# Patient Record
Sex: Female | Born: 1965 | Race: White | Hispanic: No | Marital: Married | Smoking: Former smoker
Health system: Southern US, Community
[De-identification: ages and names within clinical notes are randomized; demographics above are authoritative.]

## PROBLEM LIST (undated history)

## (undated) DIAGNOSIS — E785 Hyperlipidemia, unspecified: Secondary | ICD-10-CM

## (undated) DIAGNOSIS — G4700 Insomnia, unspecified: Secondary | ICD-10-CM

## (undated) DIAGNOSIS — T7840XA Allergy, unspecified, initial encounter: Secondary | ICD-10-CM

## (undated) DIAGNOSIS — E669 Obesity, unspecified: Secondary | ICD-10-CM

## (undated) DIAGNOSIS — E119 Type 2 diabetes mellitus without complications: Secondary | ICD-10-CM

## (undated) HISTORY — PX: TUBAL LIGATION: SHX77

## (undated) HISTORY — PX: BREAST BIOPSY: SHX20

## (undated) HISTORY — DX: Type 2 diabetes mellitus without complications: E11.9

## (undated) HISTORY — DX: Hyperlipidemia, unspecified: E78.5

## (undated) HISTORY — PX: OTHER SURGICAL HISTORY: SHX169

## (undated) HISTORY — DX: Obesity, unspecified: E66.9

## (undated) HISTORY — DX: Insomnia, unspecified: G47.00

## (undated) HISTORY — DX: Allergy, unspecified, initial encounter: T78.40XA

---

## 1997-10-01 ENCOUNTER — Other Ambulatory Visit: Admission: RE | Admit: 1997-10-01 | Discharge: 1997-10-01 | Payer: Self-pay | Admitting: Obstetrics and Gynecology

## 1998-10-06 ENCOUNTER — Other Ambulatory Visit: Admission: RE | Admit: 1998-10-06 | Discharge: 1998-10-06 | Payer: Self-pay | Admitting: Obstetrics and Gynecology

## 1999-11-02 ENCOUNTER — Other Ambulatory Visit: Admission: RE | Admit: 1999-11-02 | Discharge: 1999-11-02 | Payer: Self-pay | Admitting: Obstetrics and Gynecology

## 2000-04-01 ENCOUNTER — Ambulatory Visit (HOSPITAL_COMMUNITY): Admission: RE | Admit: 2000-04-01 | Discharge: 2000-04-01 | Payer: Self-pay | Admitting: Obstetrics and Gynecology

## 2000-11-14 ENCOUNTER — Other Ambulatory Visit: Admission: RE | Admit: 2000-11-14 | Discharge: 2000-11-14 | Payer: Self-pay | Admitting: Obstetrics and Gynecology

## 2002-05-15 ENCOUNTER — Other Ambulatory Visit: Admission: RE | Admit: 2002-05-15 | Discharge: 2002-05-15 | Payer: Self-pay | Admitting: Obstetrics & Gynecology

## 2004-04-01 ENCOUNTER — Other Ambulatory Visit: Admission: RE | Admit: 2004-04-01 | Discharge: 2004-04-01 | Payer: Self-pay | Admitting: Obstetrics and Gynecology

## 2004-07-10 ENCOUNTER — Ambulatory Visit (HOSPITAL_COMMUNITY): Admission: RE | Admit: 2004-07-10 | Discharge: 2004-07-10 | Payer: Self-pay | Admitting: Internal Medicine

## 2004-08-12 ENCOUNTER — Encounter: Admission: RE | Admit: 2004-08-12 | Discharge: 2004-08-12 | Payer: Self-pay | Admitting: Occupational Medicine

## 2007-01-09 IMAGING — CR DG SINUSES COMPLETE 3+V
6 series · 6 of 6 positions shown · non-contrast
Comparison: none

HISTORY: Congestion, hoarseness, cough

CHEST 2 VIEWS:
No prior study for comparison
Normal heart size, mediastinal contours, and vascularity.
Lungs clear.
No effusion or pneumothorax.
Bones unremarkable.

[w waters *]
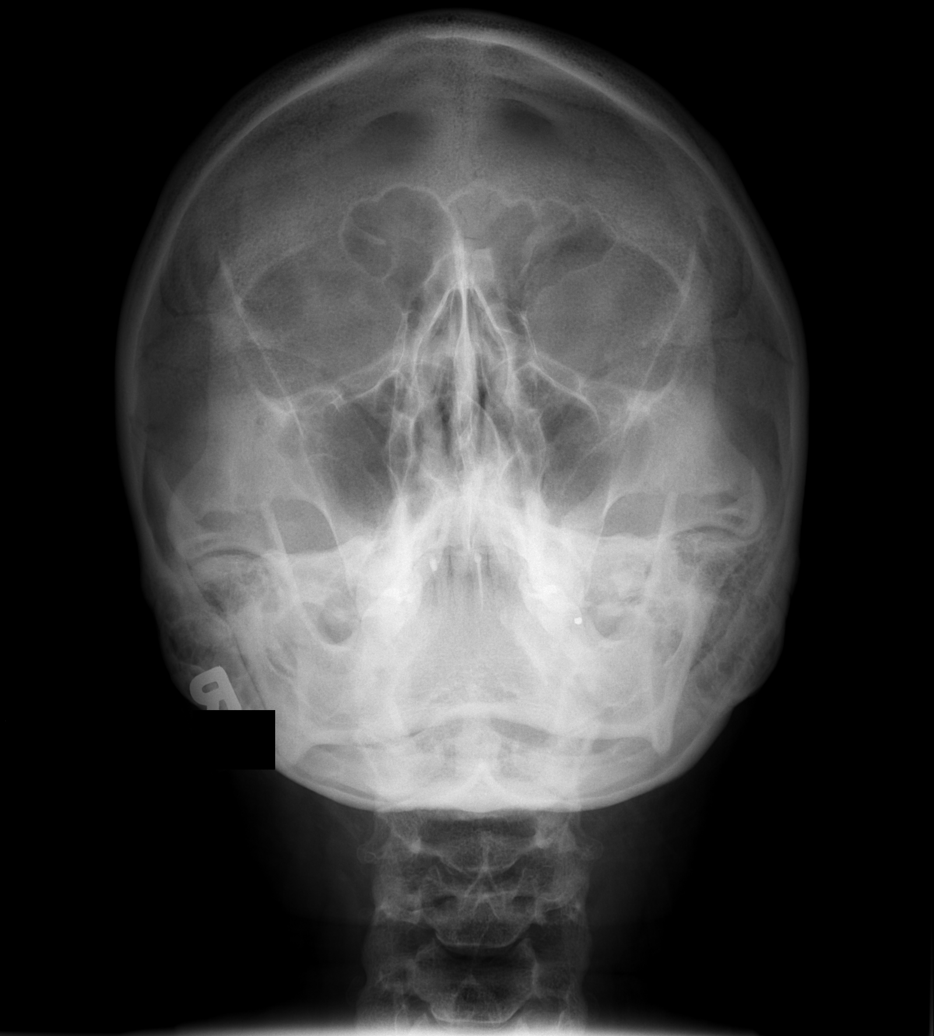

[w smv]
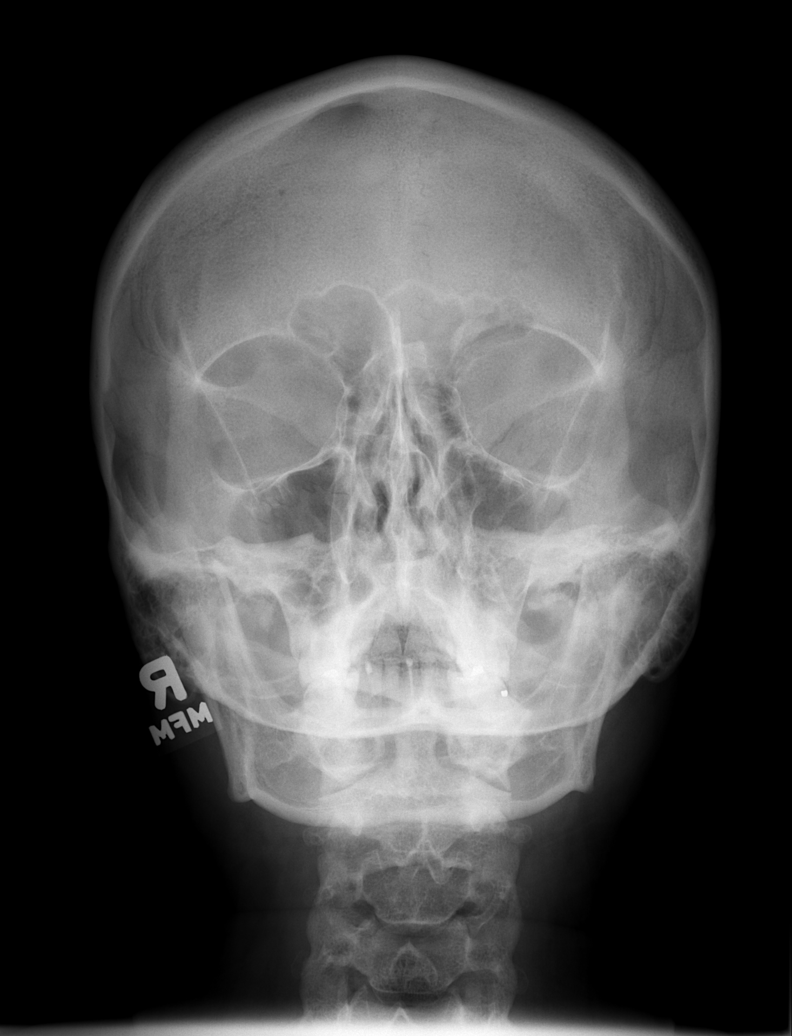

[[person_name] (1 of 2)]
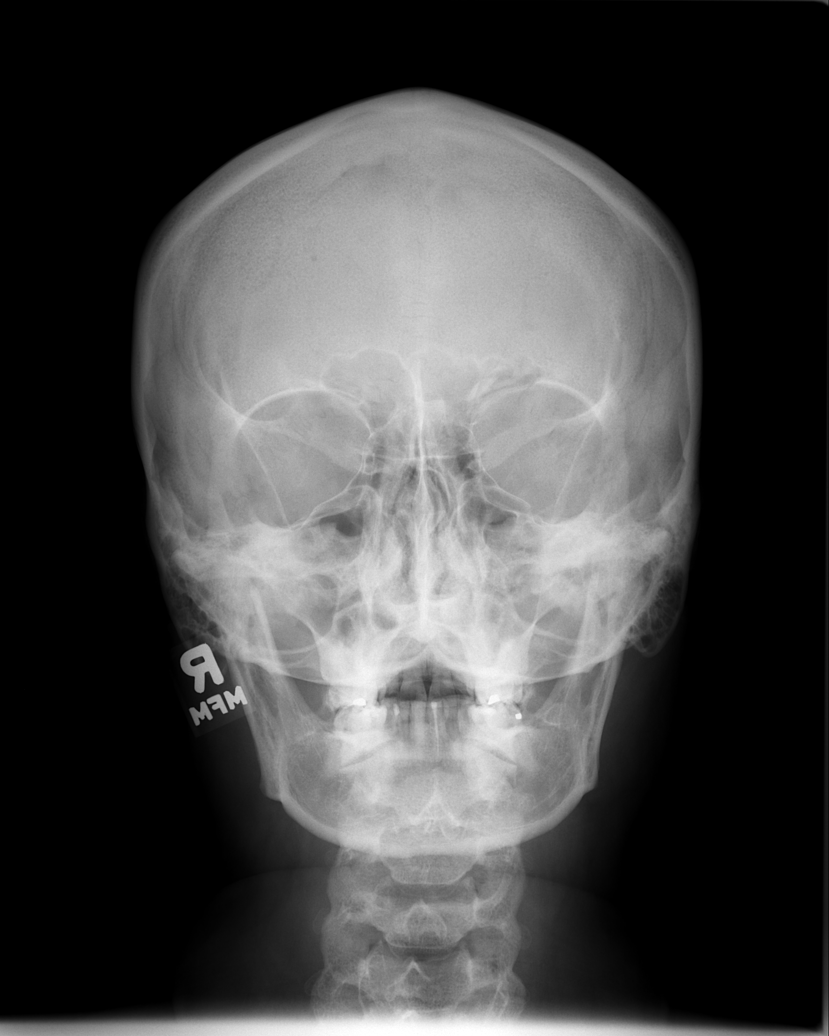

[w skull lat]
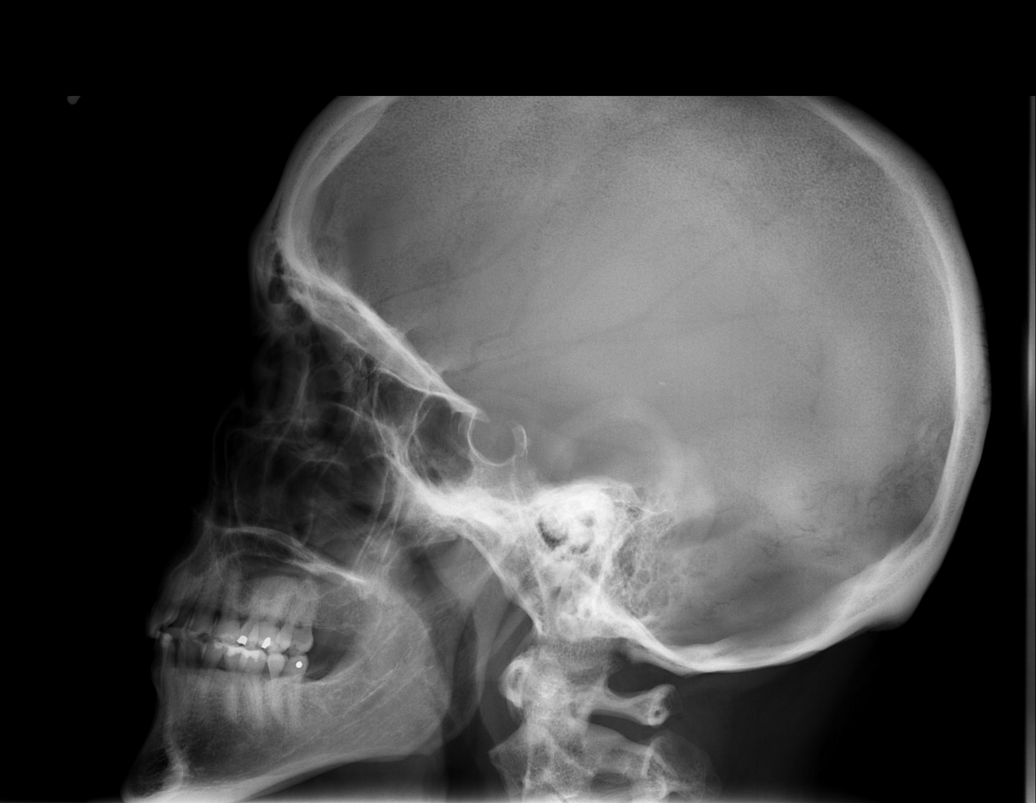

[[person_name] (2 of 2)]
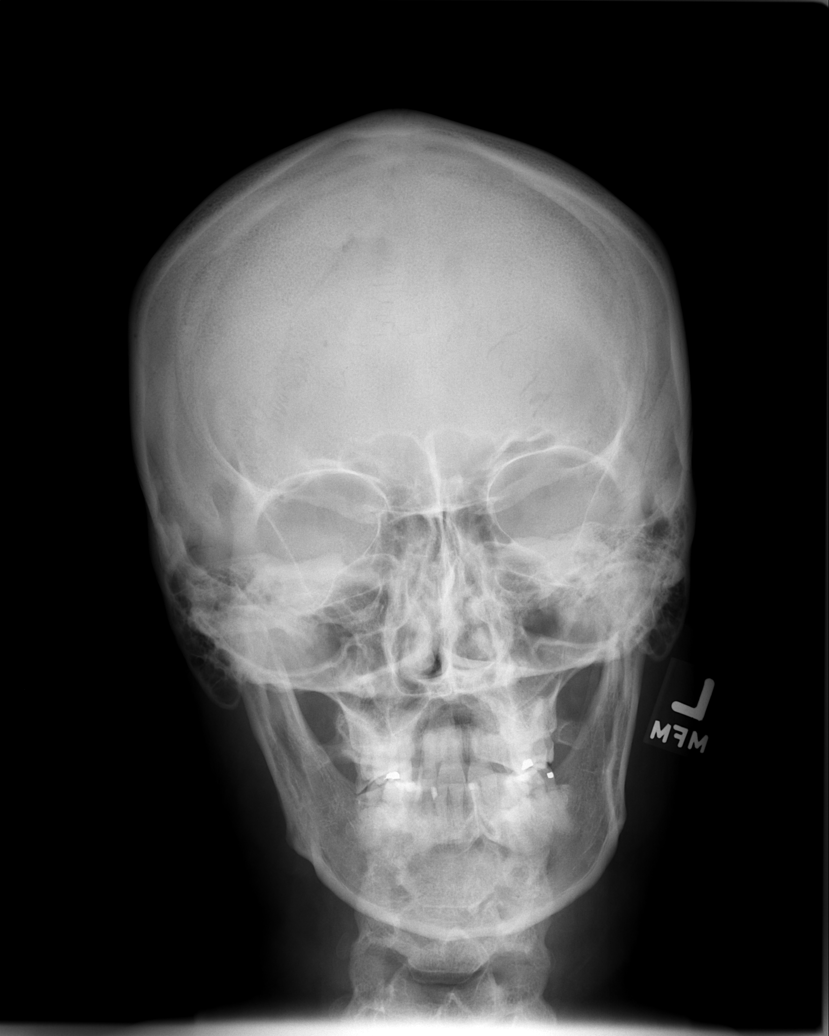

[w smv *]
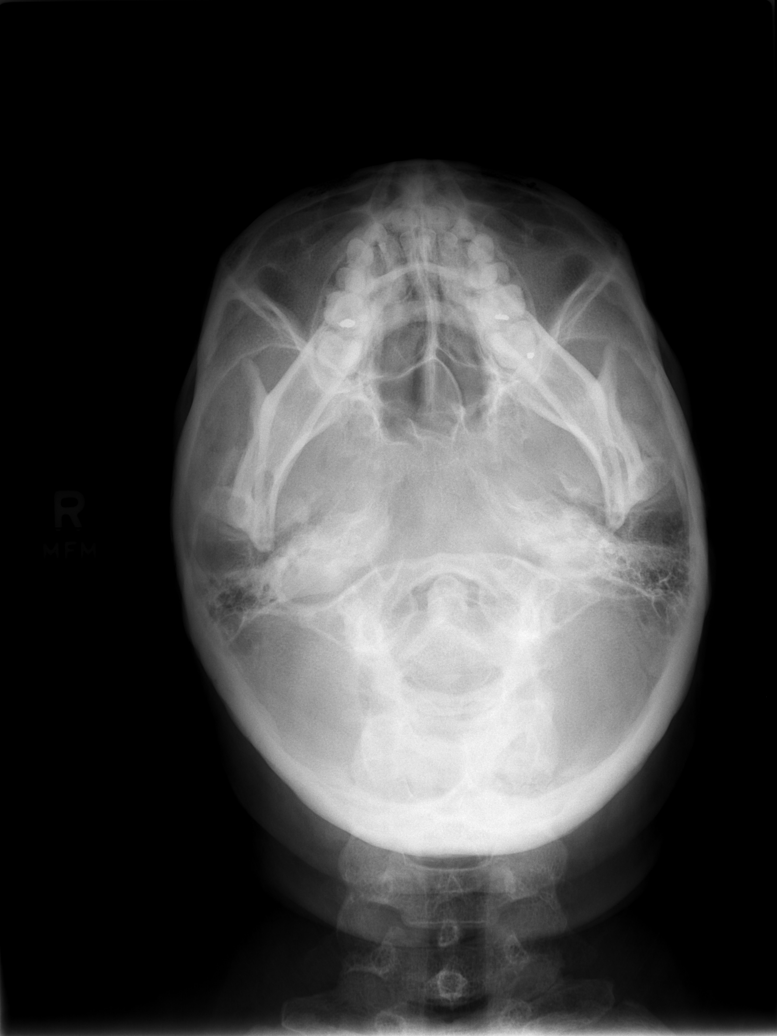

[6 of 6 positions shown; findings below may reference images not displayed]

IMPRESSION: No acute abnormalities.

SINUSES 5 VIEWS:

Nasal septum midline.
Paranasal sinuses clear.
Orbits and facial bones intact and normal appearance.
No sinus opacification or air-fluid levels.
Sella turcica normal size.
No acute abnormality seen.
IMPRESSION: Negative sinus series.

## 2007-01-09 IMAGING — CR DG CHEST 2V
2 series · 2 of 2 positions shown · non-contrast
Comparison: none

HISTORY: Congestion, hoarseness, cough

CHEST 2 VIEWS:
No prior study for comparison
Normal heart size, mediastinal contours, and vascularity.
Lungs clear.
No effusion or pneumothorax.
Bones unremarkable.

[w chest pa]
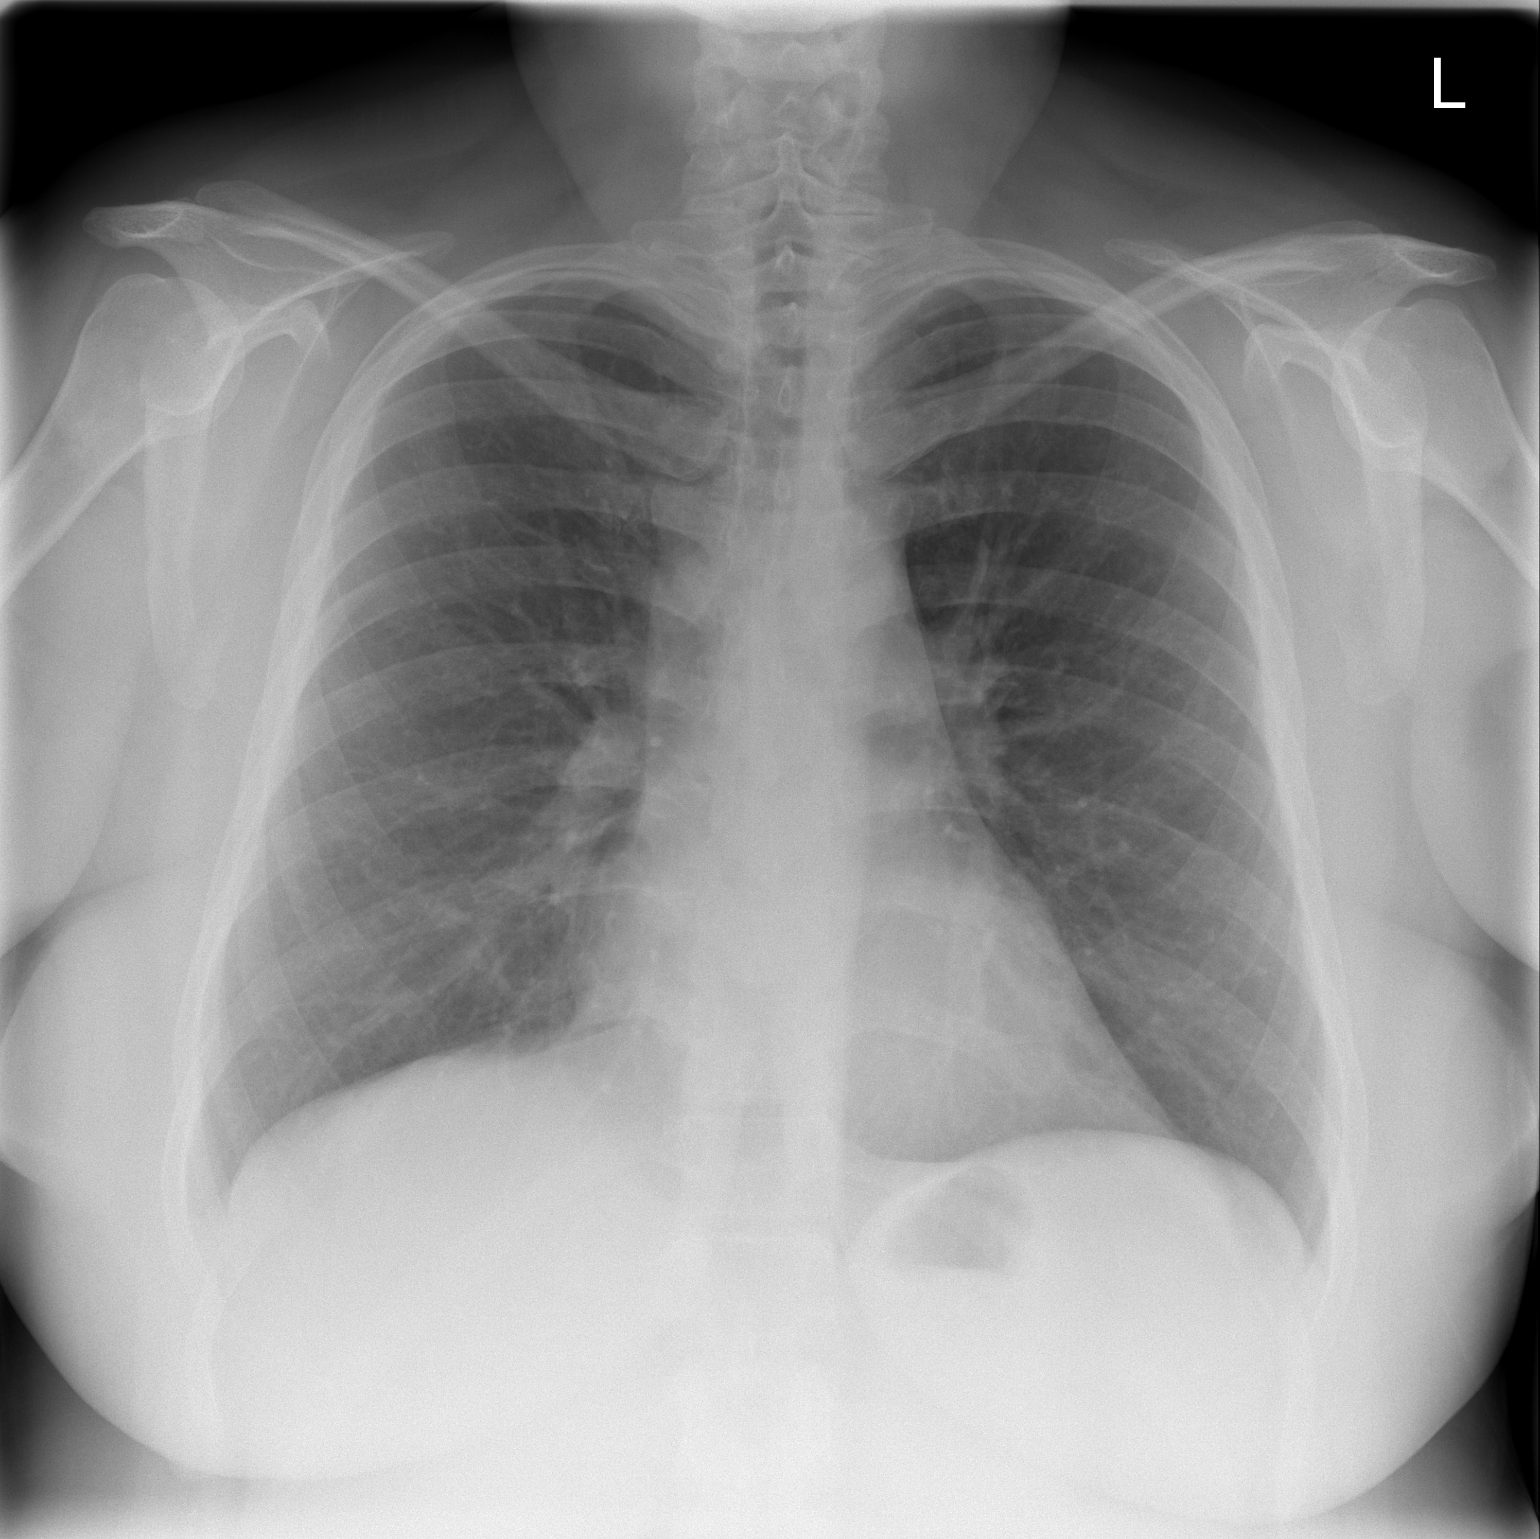

[w chest lat]
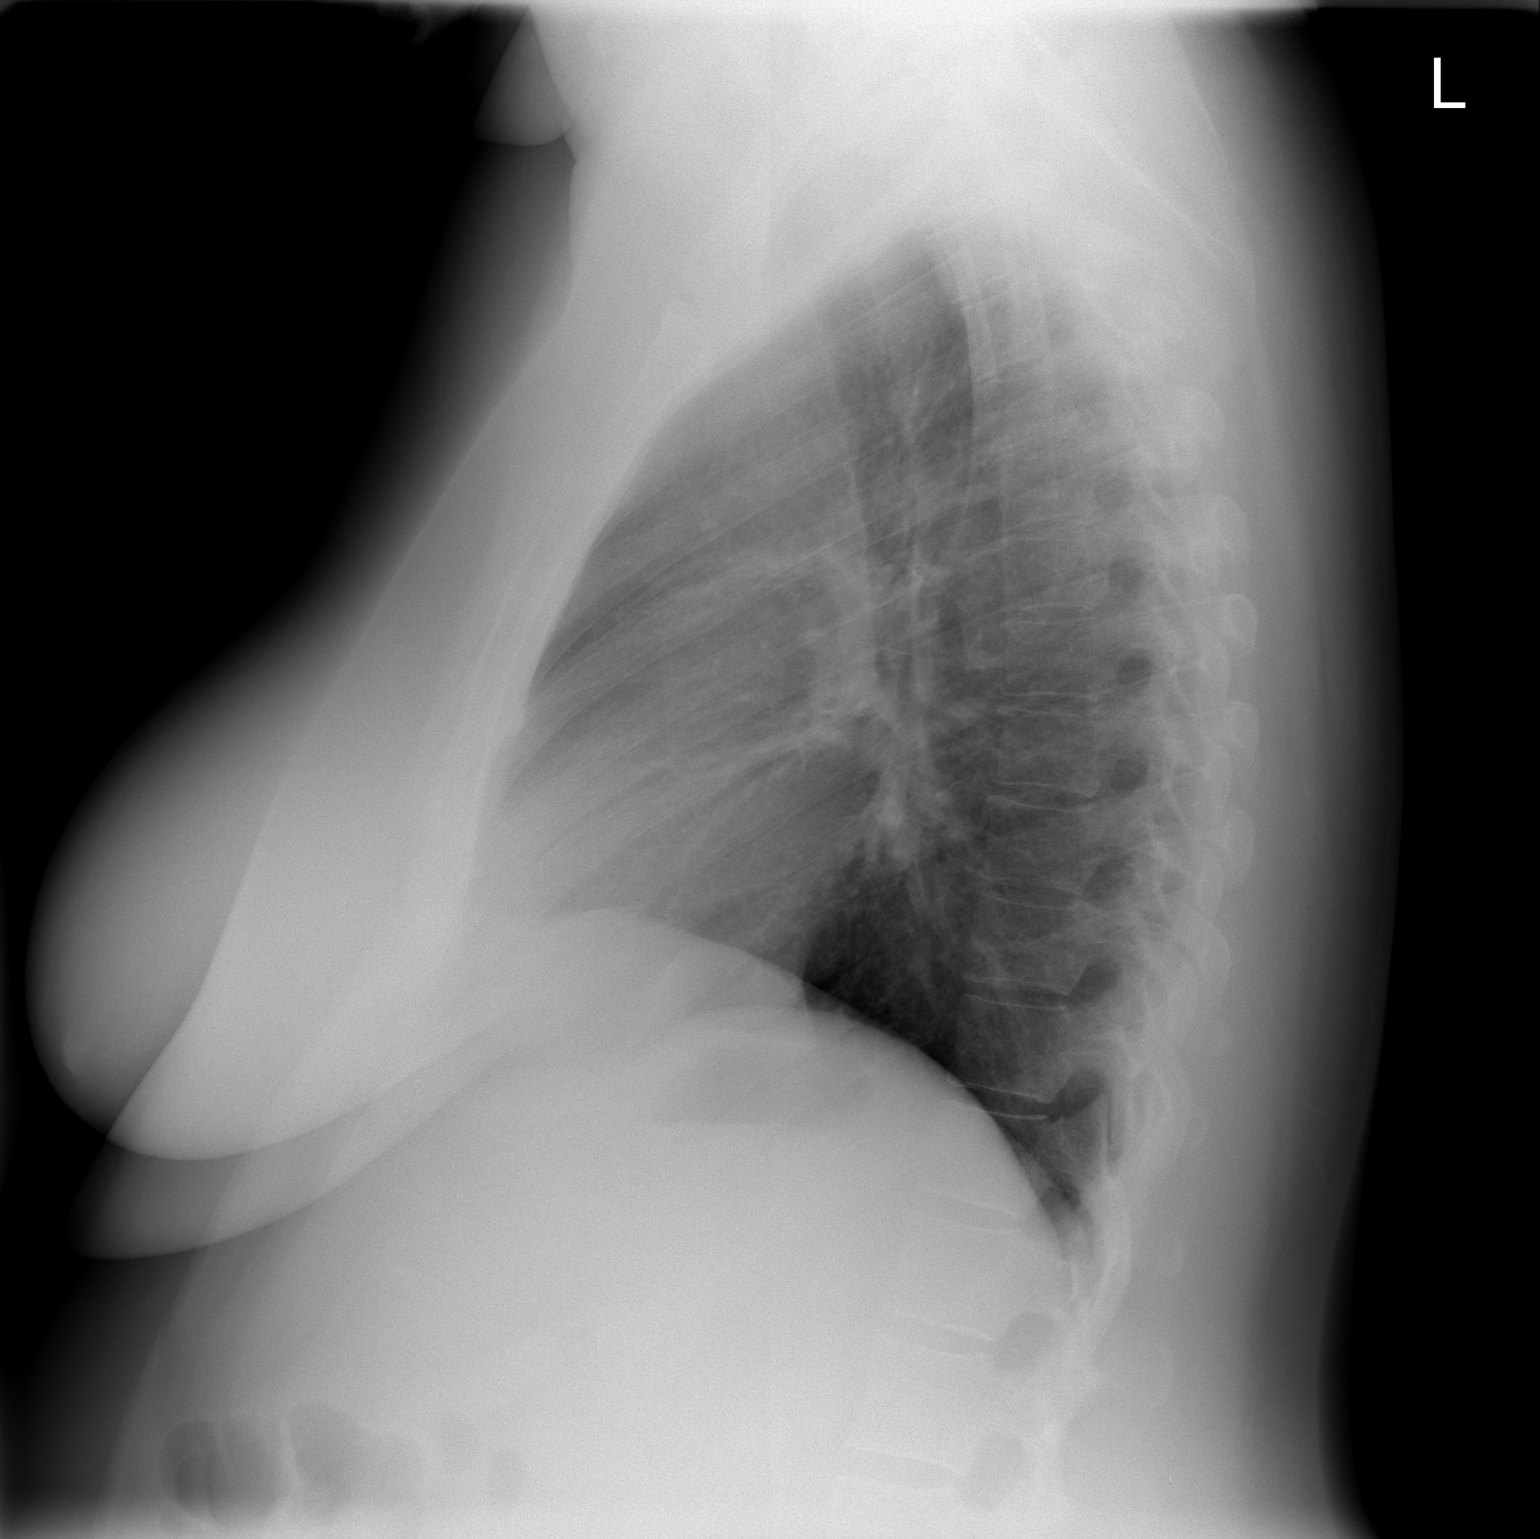

[2 of 2 positions shown; findings below may reference images not displayed]

IMPRESSION: No acute abnormalities.

SINUSES 5 VIEWS:

Nasal septum midline.
Paranasal sinuses clear.
Orbits and facial bones intact and normal appearance.
No sinus opacification or air-fluid levels.
Sella turcica normal size.
No acute abnormality seen.
IMPRESSION: Negative sinus series.

## 2007-10-23 ENCOUNTER — Ambulatory Visit: Payer: Self-pay | Admitting: Internal Medicine

## 2007-11-02 ENCOUNTER — Ambulatory Visit: Payer: Self-pay | Admitting: Internal Medicine

## 2007-12-21 ENCOUNTER — Ambulatory Visit: Payer: Self-pay | Admitting: Internal Medicine

## 2008-01-23 ENCOUNTER — Ambulatory Visit: Payer: Self-pay | Admitting: Internal Medicine

## 2008-03-25 ENCOUNTER — Ambulatory Visit: Payer: Self-pay | Admitting: Internal Medicine

## 2008-07-30 ENCOUNTER — Ambulatory Visit: Payer: Self-pay | Admitting: Internal Medicine

## 2008-11-12 ENCOUNTER — Ambulatory Visit: Payer: Self-pay | Admitting: Internal Medicine

## 2009-05-29 ENCOUNTER — Ambulatory Visit: Payer: Self-pay | Admitting: Internal Medicine

## 2010-02-19 ENCOUNTER — Ambulatory Visit: Payer: Commercial Managed Care - PPO | Attending: Internal Medicine | Admitting: *Deleted

## 2010-02-19 DIAGNOSIS — E119 Type 2 diabetes mellitus without complications: Secondary | ICD-10-CM | POA: Insufficient documentation

## 2010-02-19 DIAGNOSIS — Z713 Dietary counseling and surveillance: Secondary | ICD-10-CM | POA: Insufficient documentation

## 2010-03-17 ENCOUNTER — Encounter (INDEPENDENT_AMBULATORY_CARE_PROVIDER_SITE_OTHER): Payer: Commercial Managed Care - PPO | Admitting: Internal Medicine

## 2010-03-17 DIAGNOSIS — E119 Type 2 diabetes mellitus without complications: Secondary | ICD-10-CM

## 2010-03-17 DIAGNOSIS — E785 Hyperlipidemia, unspecified: Secondary | ICD-10-CM

## 2010-03-17 DIAGNOSIS — Z23 Encounter for immunization: Secondary | ICD-10-CM

## 2010-03-26 ENCOUNTER — Ambulatory Visit (INDEPENDENT_AMBULATORY_CARE_PROVIDER_SITE_OTHER): Payer: Commercial Managed Care - PPO | Admitting: Internal Medicine

## 2010-03-26 DIAGNOSIS — J9801 Acute bronchospasm: Secondary | ICD-10-CM

## 2010-04-28 ENCOUNTER — Ambulatory Visit (INDEPENDENT_AMBULATORY_CARE_PROVIDER_SITE_OTHER): Payer: Commercial Managed Care - PPO | Admitting: Internal Medicine

## 2010-04-28 DIAGNOSIS — E119 Type 2 diabetes mellitus without complications: Secondary | ICD-10-CM

## 2010-05-04 ENCOUNTER — Encounter: Payer: Self-pay | Admitting: Internal Medicine

## 2010-05-06 ENCOUNTER — Encounter: Payer: Self-pay | Admitting: Internal Medicine

## 2010-05-06 DIAGNOSIS — Z8669 Personal history of other diseases of the nervous system and sense organs: Secondary | ICD-10-CM | POA: Insufficient documentation

## 2010-05-06 DIAGNOSIS — G4700 Insomnia, unspecified: Secondary | ICD-10-CM | POA: Insufficient documentation

## 2010-06-05 NOTE — Op Note (Signed)
[facility]  Patient:    Ashley Gilmore, Ashley Gilmore                           MRN: [id number] Proc. Date: 04/01/00 Adm. Date:  [id number] Attending:  Osborn Coho                           Operative Report  PREOPERATIVE DIAGNOSES:       1. Twin intrauterine pregnancy, 8-week                                  gestational age.                               2. Patient desires termination of pregnancy.                               3. Patient desires permanent sterilization.  POSTOPERATIVE DIAGNOSES:  PROCEDURES:                   1. Dilation and evacuation.                               2. Bilateral tubal ligation, application of                                  Hulka clips.  SURGEON:                      Mark E. Dareen Piano, M.D.  ANESTHESIA:                   General endotracheal.  ESTIMATED BLOOD LOSS:         100 cc.  SPECIMENS:                    Products of conception sent to pathology.  DRAINS:                       Red rubber catheter, bladder.  ANTIBIOTICS:                  Cleocin 900 mg IV x 1.  DESCRIPTION OF PROCEDURE:     The patient was taken to the operating room where she was placed in a dorsal supine position. A general anesthetic was administered without complication. She was then placed in a dorsal lithotomy position and prepped with Hibiclens. She was draped in the usual fashion for this procedure. A sterile speculum was placed in the vagina. A single-tooth tenaculum was applied to the anterior cervical lip. The cervical os was then dilated to a 31 [place]. A 9 mm suction cannula was placed in the uterine cavity and products of conception withdrawn. Sharp curettage was performed followed by repeat suction. This concluded the D&E. At this point, umbilicus was injected with 10 cc of 0.25% Marcaine. A vertical skin incision was made in the umbilicus. The Veress needle was placed in the peritoneal cavity, 3.5 liters of carbon dioxide was  placed. A 12 mm trocar was placed in the peritoneal cavity.  The scope was the placed. On examination, the patient appeared to have a gravid uterus, fallopian tubes and ovaries were normal. The liver appeared to be normal. Gallbladder was not visualized. Appendix was not visualized. At this point, a Hulka clip was placed on the right fallopian tube in the isthmic portion. It appeared the clip was tightly closed and perpendicular to the tube. The entire tube appeared to be within the clip. A similar procedure was performed on the opposite side. At this point, the scope was removed and pneumoperitoneum released, and skin incisions closed with interrupted 0 Vicryl suture and 4-0 Vicryl suture. The patient did have some heavy bleeding noted from the vagina and she was given Methergine 0.2 mg IM x 1.  The patient will be discharged to home with Cleocin 150 mg t.i.d. x 2 days, Methergine 0.2 mg q.6h. x 6, and Anaprox Double Strength p.r.n. She will follow up in the office in 12 days. DD:  04/01/00 TD:  04/01/00 Job: [id number]

## 2010-09-03 ENCOUNTER — Ambulatory Visit: Payer: Commercial Managed Care - PPO | Admitting: Internal Medicine

## 2010-09-04 ENCOUNTER — Ambulatory Visit (INDEPENDENT_AMBULATORY_CARE_PROVIDER_SITE_OTHER): Payer: Commercial Managed Care - PPO | Admitting: Internal Medicine

## 2010-09-04 ENCOUNTER — Encounter: Payer: Self-pay | Admitting: Internal Medicine

## 2010-09-04 DIAGNOSIS — E669 Obesity, unspecified: Secondary | ICD-10-CM

## 2010-09-04 DIAGNOSIS — E1169 Type 2 diabetes mellitus with other specified complication: Secondary | ICD-10-CM

## 2010-09-04 DIAGNOSIS — J309 Allergic rhinitis, unspecified: Secondary | ICD-10-CM

## 2010-09-04 DIAGNOSIS — Z87891 Personal history of nicotine dependence: Secondary | ICD-10-CM

## 2010-09-04 DIAGNOSIS — J45909 Unspecified asthma, uncomplicated: Secondary | ICD-10-CM

## 2010-09-04 DIAGNOSIS — E119 Type 2 diabetes mellitus without complications: Secondary | ICD-10-CM

## 2010-09-04 LAB — HEMOGLOBIN A1C
Hgb A1c MFr Bld: 6.9 % — ABNORMAL HIGH (ref <5.7)
Mean Plasma Glucose: 151 mg/dL — ABNORMAL HIGH (ref <117)

## 2010-09-06 ENCOUNTER — Encounter: Payer: Self-pay | Admitting: Internal Medicine

## 2010-09-06 DIAGNOSIS — J45909 Unspecified asthma, uncomplicated: Secondary | ICD-10-CM | POA: Insufficient documentation

## 2010-09-06 DIAGNOSIS — J309 Allergic rhinitis, unspecified: Secondary | ICD-10-CM | POA: Insufficient documentation

## 2010-09-06 NOTE — Progress Notes (Signed)
  Subjective:    Patient ID: Ashley Gilmore, female    DOB: 03/01/1965, 45 y.o.   MRN: [id number]  HPI 45 year old white female with history of diabetes mellitus and obesity. History of GE reflux and allergic rhinitis. History of asthma. Hemoglobin A1c has been drawn and is 6.9%. Has been tried previously on metformin. Working with [facility] at [facility]. Doesn't get much exercise. Unable to tolerate metformin due to diarrhea. Also history of migraine headaches. Reminded about diabetic eye exam    Review of Systems     Objective:   Physical Exam chest clear to auscultation, cardiac exam regular rate and rhythm normal S1 and S2, extremities without edema. No diabetic foot issues        Assessment & Plan:  Adult onset diabetes  Asthma  Allergic rhinitis  Migraine headache history  Obesity  Plan: Samples of Trajenta 5 mg daily plus prescription card and prescription. Reassess in 3-4 months.

## 2010-12-01 ENCOUNTER — Ambulatory Visit (INDEPENDENT_AMBULATORY_CARE_PROVIDER_SITE_OTHER): Payer: Commercial Managed Care - PPO | Admitting: Internal Medicine

## 2010-12-01 ENCOUNTER — Encounter: Payer: Self-pay | Admitting: Internal Medicine

## 2010-12-01 VITALS — BP 126/74 | HR 100 | Temp 99.3°F | Wt 244.0 lb

## 2010-12-01 DIAGNOSIS — E119 Type 2 diabetes mellitus without complications: Secondary | ICD-10-CM

## 2010-12-01 DIAGNOSIS — J040 Acute laryngitis: Secondary | ICD-10-CM

## 2010-12-01 DIAGNOSIS — H6691 Otitis media, unspecified, right ear: Secondary | ICD-10-CM

## 2010-12-01 DIAGNOSIS — H6690 Otitis media, unspecified, unspecified ear: Secondary | ICD-10-CM

## 2010-12-01 NOTE — Progress Notes (Signed)
  Subjective:    Patient ID: Ashley Gilmore, female    DOB: Jan 06, 1966, 45 y.o.   MRN: [id number]  HPI patient says everyone in her home has had respiratory infections. She has laryngitis. Has cough but no wheezing. Previous history of asthmatic bronchitis. History of diabetes mellitus. No fever or shaking chills.    Review of Systems     Objective:   Physical Exam right TM is full and somewhat dull; left TM full but not dull; pharynx is clear; neck is supple without significant adenopathy; chest is clear to auscultation no wheezes or rales appreciated. However she speaks in a whisper        Assessment & Plan:  Laryngitis  Right otitis media  Plan: Levaquin 500 milligrams daily for 10 days. Samples of Rezira 1 teaspoon by mouth each bedtime x7 days for cough. Call if not better in 48-72 hours or sooner if worse. Call if wheezing begins.

## 2010-12-01 NOTE — Patient Instructions (Signed)
Take antibiotic daily with a meal as prescribed for 10 days. Samples of Rezira 1 teaspoon at bedtime for cough for 7 days.

## 2010-12-08 ENCOUNTER — Other Ambulatory Visit: Payer: Self-pay | Admitting: Internal Medicine

## 2010-12-18 ENCOUNTER — Ambulatory Visit (INDEPENDENT_AMBULATORY_CARE_PROVIDER_SITE_OTHER): Payer: Commercial Managed Care - PPO | Admitting: Internal Medicine

## 2010-12-18 ENCOUNTER — Encounter: Payer: Self-pay | Admitting: Internal Medicine

## 2010-12-18 VITALS — BP 120/76 | HR 92 | Temp 98.7°F | Wt 246.5 lb

## 2010-12-18 DIAGNOSIS — H6690 Otitis media, unspecified, unspecified ear: Secondary | ICD-10-CM

## 2010-12-18 DIAGNOSIS — J040 Acute laryngitis: Secondary | ICD-10-CM

## 2010-12-18 DIAGNOSIS — H6691 Otitis media, unspecified, right ear: Secondary | ICD-10-CM

## 2010-12-18 NOTE — Patient Instructions (Signed)
Take Levaquin 500 mg daily with a meal for 10 days. Take prednisone taper over 6 days as directed. Use Hycodan sparingly for cough

## 2010-12-18 NOTE — Progress Notes (Signed)
  Subjective:    Patient ID: Ashley Gilmore, female    DOB: 1965-03-16, 45 y.o.   MRN: [id number]  HPI 45 year old white female patient with diabetes, asthma, allergic rhinitis was here on November 13 with laryngitis which was treated with Levaquin and samples of Rezira, a cough preparation. Apparently improved some. Voice did return.    Subsequently, she took a trip to the beach for her father's birthday and now has recurrent laryngitis. No shortness of breath or wheezing. No fever or shaking chills. Speaks in a whisper. Denies sore throat but has some cough. Complains of right ear discomfort.    Review of Systems     Objective:   Physical Exam HEENT exam: Right TM is full but not red; left TM slightly full; pharynx slightly injected without exudate; neck is supple without significant adenopathy chest clear to auscultation without rales or wheezing        Assessment & Plan:  Right otitis media  Laryngitis  Plan: Levaquin 500 milligrams daily for 10 days; Sterapred DS 10 mg 6 day dosepak take hysterectomy in tapering course, Hycodan ( 8 ounces) 1 teaspoon by mouth every 6 hours when necessary cough.  Plan: Sterapred DS 10 mg 6 day dosepak; Hycodan syrup 1 teaspoon by mouth every 6 hours when necessary cough;

## 2011-01-07 ENCOUNTER — Ambulatory Visit: Payer: Commercial Managed Care - PPO | Admitting: Internal Medicine

## 2011-01-21 ENCOUNTER — Ambulatory Visit (INDEPENDENT_AMBULATORY_CARE_PROVIDER_SITE_OTHER): Payer: Commercial Managed Care - PPO | Admitting: Internal Medicine

## 2011-01-21 ENCOUNTER — Encounter: Payer: Self-pay | Admitting: Internal Medicine

## 2011-01-21 DIAGNOSIS — E785 Hyperlipidemia, unspecified: Secondary | ICD-10-CM

## 2011-01-21 DIAGNOSIS — E119 Type 2 diabetes mellitus without complications: Secondary | ICD-10-CM

## 2011-01-21 DIAGNOSIS — E1169 Type 2 diabetes mellitus with other specified complication: Secondary | ICD-10-CM

## 2011-01-21 DIAGNOSIS — R300 Dysuria: Secondary | ICD-10-CM

## 2011-01-21 DIAGNOSIS — E669 Obesity, unspecified: Secondary | ICD-10-CM

## 2011-01-21 DIAGNOSIS — Z87891 Personal history of nicotine dependence: Secondary | ICD-10-CM

## 2011-01-21 LAB — POCT URINALYSIS DIPSTICK
Bilirubin, UA: NEGATIVE
Glucose, UA: NEGATIVE
Ketones, UA: NEGATIVE
Leukocytes, UA: NEGATIVE
Nitrite, UA: NEGATIVE
Protein, UA: NEGATIVE
Spec Grav, UA: 1.015
Urobilinogen, UA: NEGATIVE
pH, UA: 6

## 2011-01-21 LAB — HEMOGLOBIN A1C
Hgb A1c MFr Bld: 7.3 % — ABNORMAL HIGH (ref <5.7)
Mean Plasma Glucose: 163 mg/dL — ABNORMAL HIGH (ref <117)

## 2011-01-21 NOTE — Patient Instructions (Signed)
Continue Trajenta. Let me know what [facility] advises. Return in 6 months.

## 2011-01-22 LAB — MICROALBUMIN, URINE: Microalb, Ur: 0.5 mg/dL (ref 0.00–1.89)

## 2011-01-22 NOTE — Progress Notes (Signed)
Patient informed. 

## 2011-02-02 ENCOUNTER — Other Ambulatory Visit: Payer: Self-pay

## 2011-02-02 MED ORDER — SITAGLIPTIN PHOSPHATE 100 MG PO TABS: 100.0000 mg | ORAL_TABLET | Freq: Every day | ORAL | Status: DC

## 2011-02-02 MED ORDER — GLIPIZIDE 5 MG PO TABS: 5.0000 mg | ORAL_TABLET | ORAL | Status: DC

## 2011-04-11 ENCOUNTER — Encounter: Payer: Self-pay | Admitting: Internal Medicine

## 2011-04-11 DIAGNOSIS — Z87891 Personal history of nicotine dependence: Secondary | ICD-10-CM | POA: Insufficient documentation

## 2011-04-11 DIAGNOSIS — E785 Hyperlipidemia, unspecified: Secondary | ICD-10-CM | POA: Insufficient documentation

## 2011-04-11 NOTE — Progress Notes (Signed)
  Subjective:    Patient ID: Ashley Gilmore, female    DOB: 01/13/1966, 46 y.o.   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 She is being followed by [facility]. Realizes with the holidays she did not watch her diet as closely as she should. Not ready to quit smoking. Very busy with her daughters. Also works full-time at [employer]. And also has history of recurrent bronchitis and asthma. History of allergic rhinitis. History of migraine headaches. History of insomnia. History of GE reflux. Patient intolerant of metformin it causes diarrhea. Currently on Trajenta 5 mg daily.  In February 2012 she had a fasting glucose of 140, LDL cholesterol of 122 with normal total cholesterol of 185 and normal triglycerides of 106. HDL cholesterol was 42. Hemoglobin A1c February 2012 was 7.2%. History of vitamin D deficiency as well with level being 17 in February 2012. Patient had a normal hemoglobin A1c of 6.0% in 2006 at which time upper limits of normal was 6.1%. In 2009 and an A1c was 6.5%. In March 2010 it was 6.2%.  Hemoglobin A1c now is 7.3%. Urine for microalbumin is within normal limits.    Review of Systems     Objective:   Physical Exam chest clear; cardiac exam regular rate and rhythm; extremities without edema. Diabetic foot exam is negative.        Assessment & Plan:  Diabetes mellitus  Obesity  Insomnia  Migraine headaches  Hyperlipidemia  History of smoking  History of allergic rhinitis  History of asthma.  Plan: Patient is to return in 4- 6 months. She should continue with [facility]. Needs to diet exercise and lose weight. Continue with Trajenta 5 mg daily.

## 2011-05-24 ENCOUNTER — Other Ambulatory Visit: Payer: Commercial Managed Care - PPO | Admitting: Internal Medicine

## 2011-05-24 DIAGNOSIS — E119 Type 2 diabetes mellitus without complications: Secondary | ICD-10-CM

## 2011-05-24 DIAGNOSIS — Z0000 Encounter for general adult medical examination without abnormal findings: Secondary | ICD-10-CM

## 2011-05-24 LAB — COMPREHENSIVE METABOLIC PANEL
ALT: 16 U/L (ref 0–35)
AST: 16 U/L (ref 0–37)
Albumin: 3.9 g/dL (ref 3.5–5.2)
Alkaline Phosphatase: 90 U/L (ref 39–117)
BUN: 12 mg/dL (ref 6–23)
CO2: 30 mEq/L (ref 19–32)
Calcium: 9.2 mg/dL (ref 8.4–10.5)
Chloride: 103 mEq/L (ref 96–112)
Creat: 0.73 mg/dL (ref 0.50–1.10)
Glucose, Bld: 134 mg/dL — ABNORMAL HIGH (ref 70–99)
Potassium: 4.4 mEq/L (ref 3.5–5.3)
Sodium: 140 mEq/L (ref 135–145)
Total Bilirubin: 0.4 mg/dL (ref 0.3–1.2)
Total Protein: 6.4 g/dL (ref 6.0–8.3)

## 2011-05-24 LAB — TSH: TSH: 2.072 u[IU]/mL (ref 0.350–4.500)

## 2011-05-24 LAB — CBC WITH DIFFERENTIAL/PLATELET
Basophils Absolute: 0.1 10*3/uL (ref 0.0–0.1)
Basophils Relative: 1 % (ref 0–1)
Eosinophils Absolute: 0.3 10*3/uL (ref 0.0–0.7)
Eosinophils Relative: 3 % (ref 0–5)
HCT: 46 % (ref 36.0–46.0)
Hemoglobin: 14.9 g/dL (ref 12.0–15.0)
Lymphocytes Relative: 25 % (ref 12–46)
Lymphs Abs: 2.5 10*3/uL (ref 0.7–4.0)
MCH: 28.2 pg (ref 26.0–34.0)
MCHC: 32.4 g/dL (ref 30.0–36.0)
MCV: 87 fL (ref 78.0–100.0)
Monocytes Absolute: 0.5 10*3/uL (ref 0.1–1.0)
Monocytes Relative: 5 % (ref 3–12)
Neutro Abs: 6.7 10*3/uL (ref 1.7–7.7)
Neutrophils Relative %: 66 % (ref 43–77)
Platelets: 346 10*3/uL (ref 150–400)
RBC: 5.29 MIL/uL — ABNORMAL HIGH (ref 3.87–5.11)
RDW: 13.4 % (ref 11.5–15.5)
WBC: 10.1 10*3/uL (ref 4.0–10.5)

## 2011-05-24 LAB — LIPID PANEL
Cholesterol: 184 mg/dL (ref 0–200)
HDL: 45 mg/dL (ref >39)
LDL Cholesterol: 119 mg/dL — ABNORMAL HIGH (ref 0–99)
Total CHOL/HDL Ratio: 4.1 Ratio
Triglycerides: 99 mg/dL (ref <150)
VLDL: 20 mg/dL (ref 0–40)

## 2011-05-24 LAB — HEMOGLOBIN A1C
Hgb A1c MFr Bld: 6.5 % — ABNORMAL HIGH (ref <5.7)
Mean Plasma Glucose: 140 mg/dL — ABNORMAL HIGH (ref <117)

## 2011-05-25 ENCOUNTER — Encounter: Payer: Self-pay | Admitting: Internal Medicine

## 2011-05-25 ENCOUNTER — Ambulatory Visit (INDEPENDENT_AMBULATORY_CARE_PROVIDER_SITE_OTHER): Payer: Commercial Managed Care - PPO | Admitting: Internal Medicine

## 2011-05-25 ENCOUNTER — Other Ambulatory Visit (HOSPITAL_COMMUNITY)
Admission: RE | Admit: 2011-05-25 | Discharge: 2011-05-25 | Disposition: A | Discharge status: Home / Self Care | Payer: 59 | Source: Ambulatory Visit | Attending: Internal Medicine | Admitting: Internal Medicine

## 2011-05-25 VITALS — BP 114/72 | HR 68 | Temp 99.0°F | Ht 66.0 in | Wt 254.0 lb

## 2011-05-25 DIAGNOSIS — G4700 Insomnia, unspecified: Secondary | ICD-10-CM

## 2011-05-25 DIAGNOSIS — K219 Gastro-esophageal reflux disease without esophagitis: Secondary | ICD-10-CM

## 2011-05-25 DIAGNOSIS — J988 Other specified respiratory disorders: Secondary | ICD-10-CM

## 2011-05-25 DIAGNOSIS — J45909 Unspecified asthma, uncomplicated: Secondary | ICD-10-CM

## 2011-05-25 DIAGNOSIS — Z0000 Encounter for general adult medical examination without abnormal findings: Secondary | ICD-10-CM

## 2011-05-25 DIAGNOSIS — J398 Other specified diseases of upper respiratory tract: Secondary | ICD-10-CM

## 2011-05-25 DIAGNOSIS — Z8669 Personal history of other diseases of the nervous system and sense organs: Secondary | ICD-10-CM

## 2011-05-25 DIAGNOSIS — Z124 Encounter for screening for malignant neoplasm of cervix: Secondary | ICD-10-CM

## 2011-05-25 DIAGNOSIS — Z23 Encounter for immunization: Secondary | ICD-10-CM

## 2011-05-25 DIAGNOSIS — E119 Type 2 diabetes mellitus without complications: Secondary | ICD-10-CM

## 2011-05-25 DIAGNOSIS — Z87891 Personal history of nicotine dependence: Secondary | ICD-10-CM

## 2011-05-25 DIAGNOSIS — Z01419 Encounter for gynecological examination (general) (routine) without abnormal findings: Secondary | ICD-10-CM | POA: Insufficient documentation

## 2011-05-25 DIAGNOSIS — E669 Obesity, unspecified: Secondary | ICD-10-CM

## 2011-05-25 DIAGNOSIS — J309 Allergic rhinitis, unspecified: Secondary | ICD-10-CM

## 2011-05-25 DIAGNOSIS — E785 Hyperlipidemia, unspecified: Secondary | ICD-10-CM

## 2011-05-25 LAB — POCT URINALYSIS DIPSTICK
Bilirubin, UA: NEGATIVE
Blood, UA: NEGATIVE
Glucose, UA: NEGATIVE
Ketones, UA: NEGATIVE
Leukocytes, UA: NEGATIVE
Nitrite, UA: NEGATIVE
Protein, UA: NEGATIVE
Spec Grav, UA: 1.01
Urobilinogen, UA: NEGATIVE
pH, UA: 7

## 2011-05-25 LAB — VITAMIN D 25 HYDROXY (VIT D DEFICIENCY, FRACTURES): Vit D, 25-Hydroxy: 24 ng/mL — ABNORMAL LOW (ref 30–89)

## 2011-05-25 NOTE — Patient Instructions (Addendum)
Continue same meds . New Rx for Chantix given . Please stop smoking.

## 2011-05-25 NOTE — Progress Notes (Signed)
  Subjective:    Patient ID: Ashley Gilmore, female    DOB: [birth year], 46 y.o.   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 Some time ago, tried Chantix and did quit smoking but resumed about a month later. Going to [facility] for glucose control. On Januvia and Glipizide for type 2 diabetes mellitus .Sometimes has heavy menses lasting 4 days  but still having monthly periods. History of obesity, GE reflux, insomnia, migraine headaches, asthma.    Review of Systems  Constitutional: Positive for fatigue.  HENT: Negative.   Respiratory: Negative.        History of asthma related to respiratory infections  Cardiovascular: Negative.   Gastrointestinal:       History of GE reflux on PPI  Genitourinary:       Heavy menses 4 days long  Musculoskeletal: Negative.   Psychiatric/Behavioral:       History of insomnia       Objective:   Physical Exam  Vitals reviewed. Constitutional: She is oriented to person, place, and time. She appears well-developed and well-nourished. No distress.  HENT:  Head: Normocephalic and atraumatic.  Right Ear: External ear normal.  Left Ear: External ear normal.  Nose: Nose normal.  Mouth/Throat: Oropharynx is clear and moist. No oropharyngeal exudate.  Eyes: Conjunctivae and EOM are normal. Pupils are equal, round, and reactive to light. Right eye exhibits no discharge. Left eye exhibits no discharge. No scleral icterus.  Neck: Normal range of motion. Neck supple. No JVD present. No thyromegaly present.  Cardiovascular: Normal rate, regular rhythm, normal heart sounds and intact distal pulses.   No murmur heard. Pulmonary/Chest: Effort normal and breath sounds normal. No respiratory distress. She has no wheezes. She has no rales. She exhibits no tenderness.       Breasts normal female  Abdominal: Soft. Bowel sounds are normal. She exhibits no distension and no mass. There is no  tenderness. There is no rebound and no guarding.  Genitourinary: No vaginal discharge found.         PAP taken- bimanual normal  Musculoskeletal: Normal range of motion. She exhibits no edema.  Lymphadenopathy:    She has no cervical adenopathy.  Neurological: She is alert and oriented to person, place, and time. She has normal reflexes. Coordination normal.  Skin: Skin is warm and dry. No rash noted. She is not diaphoretic.       Diabetic foot exam: No ulcers. No tinea pedis. Pulses are normal in feet.  Psychiatric: She has a normal mood and affect. Her behavior is normal. Judgment and thought content normal.          Assessment & Plan:  Type 2 diabetes mellitus controlled with glipizide and Januvia. [facility]  History of smoking-currently one half pack per day. New prescription for Chantix.  Obesity-needs to lose weight. Encouraged diet and exercise  History of migraine headaches  GE reflux-stable on PPI. Aggravated by smoking and obesity  Insomnia-treated with when necessary Xanax   Plan: Tetanus immunization update given. Had Pneumovax immunization 03/16/2010. Needs annual mammogram. Urine microalbumin checked January 2013 and within normal limits. Reminded yearly diabetic eye exam. Diabetic control is excellent at the present time.

## 2011-07-30 ENCOUNTER — Other Ambulatory Visit: Payer: Self-pay | Admitting: Internal Medicine

## 2011-07-30 NOTE — Telephone Encounter (Signed)
Refill for 90 days

## 2011-09-19 ENCOUNTER — Encounter: Payer: Self-pay | Admitting: Internal Medicine

## 2011-09-19 DIAGNOSIS — K219 Gastro-esophageal reflux disease without esophagitis: Secondary | ICD-10-CM | POA: Insufficient documentation

## 2011-11-22 ENCOUNTER — Other Ambulatory Visit: Payer: Commercial Managed Care - PPO | Admitting: Internal Medicine

## 2011-11-22 DIAGNOSIS — E119 Type 2 diabetes mellitus without complications: Secondary | ICD-10-CM

## 2011-11-22 DIAGNOSIS — E785 Hyperlipidemia, unspecified: Secondary | ICD-10-CM

## 2011-11-22 LAB — LIPID PANEL
Cholesterol: 202 mg/dL — ABNORMAL HIGH (ref 0–200)
HDL: 46 mg/dL (ref >39)
LDL Cholesterol: 128 mg/dL — ABNORMAL HIGH (ref 0–99)
Total CHOL/HDL Ratio: 4.4 Ratio
Triglycerides: 139 mg/dL (ref <150)
VLDL: 28 mg/dL (ref 0–40)

## 2011-11-22 LAB — HEMOGLOBIN A1C
Hgb A1c MFr Bld: 6.5 % — ABNORMAL HIGH (ref <5.7)
Mean Plasma Glucose: 140 mg/dL — ABNORMAL HIGH (ref <117)

## 2011-11-23 ENCOUNTER — Encounter: Payer: Self-pay | Admitting: Internal Medicine

## 2011-11-23 ENCOUNTER — Ambulatory Visit (INDEPENDENT_AMBULATORY_CARE_PROVIDER_SITE_OTHER): Payer: Commercial Managed Care - PPO | Admitting: Internal Medicine

## 2011-11-23 VITALS — BP 108/76 | HR 76 | Temp 98.6°F | Wt 265.0 lb

## 2011-11-23 DIAGNOSIS — E669 Obesity, unspecified: Secondary | ICD-10-CM

## 2011-11-23 DIAGNOSIS — E119 Type 2 diabetes mellitus without complications: Secondary | ICD-10-CM

## 2011-11-23 DIAGNOSIS — J45909 Unspecified asthma, uncomplicated: Secondary | ICD-10-CM

## 2011-11-23 DIAGNOSIS — E785 Hyperlipidemia, unspecified: Secondary | ICD-10-CM

## 2011-11-23 MED ORDER — SIMVASTATIN 10 MG PO TABS: 10.0000 mg | ORAL_TABLET | Freq: Every day | ORAL | Status: DC

## 2011-11-23 NOTE — Patient Instructions (Addendum)
Add Zocor to med regimen. Take Januvia in morning. Follow up in 6 months for CPE with fasting labs.

## 2011-11-23 NOTE — Progress Notes (Signed)
  Subjective:    Patient ID: Ashley Gilmore, female    DOB: 27-Jan-1965, 46 y.o.   MRN: [id number]  HPI For follow up of Type 2 Diabetes Mellitus and hyperlipidemia. Is not on statin meds. Will try low dose Zocor. Hgb AIC is 6.5%. Elevated LDL 128 with total cholesterol 202.  Eye exam scheduled with [facility] this month. Remains obese. Doesn't really exercise. Says she tries to low-fat low-carb diet. History of GE reflux. History of asthma. History of smoking. Is on Januvia and Glucotrol XL 5 mg daily as recommended by [facility] pharmacist    Review of Systems     Objective:   Physical Exam skin is warm and dry. Neck is supple without thyromegaly JVD or carotid bruits. Chest clear to auscultation. Cardiac exam regular rate and rhythm normal S1 and S2. Extremities without edema. Diabetic foot exam without ulcers or calluses.        Assessment & Plan:  Type 2 diabetes mellitus  Obesity    Hyperlipidemia  History of asthma  History of smoking  Plan: Start Zocor and followup in 6 months with lipid panel, liver functions, and hemoglobin A1c at time of physical examination. No change in other medications. Influenza immunization given through employment. Encouraged diet and exercise. She's been followed through the [facility] program

## 2012-01-05 ENCOUNTER — Ambulatory Visit (INDEPENDENT_AMBULATORY_CARE_PROVIDER_SITE_OTHER): Payer: Commercial Managed Care - PPO | Admitting: Family Medicine

## 2012-01-05 DIAGNOSIS — E119 Type 2 diabetes mellitus without complications: Secondary | ICD-10-CM

## 2012-01-06 NOTE — Progress Notes (Signed)
Patient presents today for 3 month DM follow-up as part of the employer-sponsored Link to [employer]. Medications have been reviewed. I have also discussed with patient lifestyle interventions such as diet and exercise. Details of this visit can be found in the [facility] documenting system through [facility]). Patient has set a series of personal goals and will follow up in 3 months for further review of DM.

## 2012-01-10 ENCOUNTER — Encounter: Payer: Self-pay | Admitting: Family Medicine

## 2012-01-10 NOTE — Progress Notes (Signed)
Patient ID: Ashley Gilmore, female   DOB: 11/01/1965, 46 y.o.   MRN: [id number] Reviewed: Agree with documentation and management.

## 2012-01-22 ENCOUNTER — Encounter: Payer: Self-pay | Admitting: Internal Medicine

## 2012-02-18 ENCOUNTER — Other Ambulatory Visit: Payer: Self-pay

## 2012-02-18 ENCOUNTER — Other Ambulatory Visit: Payer: Self-pay | Admitting: Internal Medicine

## 2012-02-18 MED ORDER — PANTOPRAZOLE SODIUM 40 MG PO TBEC: 40.0000 mg | DELAYED_RELEASE_TABLET | Freq: Every day | ORAL | Status: DC

## 2012-02-29 LAB — HM DIABETES EYE EXAM

## 2012-03-20 ENCOUNTER — Other Ambulatory Visit: Payer: Self-pay | Admitting: Internal Medicine

## 2012-03-20 ENCOUNTER — Other Ambulatory Visit: Payer: Self-pay

## 2012-04-11 ENCOUNTER — Ambulatory Visit (INDEPENDENT_AMBULATORY_CARE_PROVIDER_SITE_OTHER): Payer: Self-pay | Admitting: Family Medicine

## 2012-04-11 DIAGNOSIS — E119 Type 2 diabetes mellitus without complications: Secondary | ICD-10-CM

## 2012-04-11 NOTE — Progress Notes (Signed)
Patient presents for 3 month follow up DM as part of the employee sponsored Link to [employer]. Medications have been reviewed. I have also discussed with patient lifestyle interventions such as diet and exercise. Full documentation of this visit can be found in the [employer] documenting system through [employer] [facility]). Patient has set a series of personal goals and will follow up in 3 months for further review of DM.

## 2012-04-18 NOTE — Progress Notes (Signed)
Patient ID: Ashley Gilmore, female   DOB: 05/11/1965, 47 y.o.   MRN: [id number] ATTENDING PHYSICIAN NOTE: I have reviewed the chart and agree with the plan as detailed above. Saara Kijowski MD Pager [phone]  

## 2012-04-18 NOTE — Progress Notes (Signed)
Patient ID: Ashley Gilmore, female   DOB: 04/15/1965, 47 y.o.   MRN: [id number] ATTENDING PHYSICIAN NOTE: I have reviewed the chart and agree with the plan as detailed above. Anie Juniel MD Pager [phone]  

## 2012-05-29 ENCOUNTER — Other Ambulatory Visit: Payer: 59 | Admitting: Internal Medicine

## 2012-05-29 DIAGNOSIS — E119 Type 2 diabetes mellitus without complications: Secondary | ICD-10-CM

## 2012-05-29 DIAGNOSIS — Z0000 Encounter for general adult medical examination without abnormal findings: Secondary | ICD-10-CM

## 2012-05-29 LAB — CBC WITH DIFFERENTIAL/PLATELET
Basophils Absolute: 0.1 10*3/uL (ref 0.0–0.1)
Basophils Relative: 1 % (ref 0–1)
Eosinophils Absolute: 0.2 10*3/uL (ref 0.0–0.7)
Eosinophils Relative: 3 % (ref 0–5)
HCT: 47.1 % — ABNORMAL HIGH (ref 36.0–46.0)
Hemoglobin: 16.1 g/dL — ABNORMAL HIGH (ref 12.0–15.0)
Lymphocytes Relative: 24 % (ref 12–46)
Lymphs Abs: 1.8 10*3/uL (ref 0.7–4.0)
MCH: 28.3 pg (ref 26.0–34.0)
MCHC: 34.2 g/dL (ref 30.0–36.0)
MCV: 82.9 fL (ref 78.0–100.0)
Monocytes Absolute: 0.4 10*3/uL (ref 0.1–1.0)
Monocytes Relative: 5 % (ref 3–12)
Neutro Abs: 5 10*3/uL (ref 1.7–7.7)
Neutrophils Relative %: 67 % (ref 43–77)
Platelets: 299 10*3/uL (ref 150–400)
RBC: 5.68 MIL/uL — ABNORMAL HIGH (ref 3.87–5.11)
RDW: 14.1 % (ref 11.5–15.5)
WBC: 7.5 10*3/uL (ref 4.0–10.5)

## 2012-05-29 LAB — COMPREHENSIVE METABOLIC PANEL
ALT: 17 U/L (ref 0–35)
AST: 14 U/L (ref 0–37)
Albumin: 3.8 g/dL (ref 3.5–5.2)
Alkaline Phosphatase: 97 U/L (ref 39–117)
BUN: 8 mg/dL (ref 6–23)
CO2: 27 mEq/L (ref 19–32)
Calcium: 9.3 mg/dL (ref 8.4–10.5)
Chloride: 104 mEq/L (ref 96–112)
Creat: 0.71 mg/dL (ref 0.50–1.10)
Glucose, Bld: 156 mg/dL — ABNORMAL HIGH (ref 70–99)
Potassium: 4.8 mEq/L (ref 3.5–5.3)
Sodium: 142 mEq/L (ref 135–145)
Total Bilirubin: 0.4 mg/dL (ref 0.3–1.2)
Total Protein: 6.4 g/dL (ref 6.0–8.3)

## 2012-05-29 LAB — HEMOGLOBIN A1C
Hgb A1c MFr Bld: 7.1 % — ABNORMAL HIGH (ref <5.7)
Mean Plasma Glucose: 157 mg/dL — ABNORMAL HIGH (ref <117)

## 2012-05-29 LAB — LIPID PANEL
Cholesterol: 154 mg/dL (ref 0–200)
HDL: 43 mg/dL (ref >39)
LDL Cholesterol: 91 mg/dL (ref 0–99)
Total CHOL/HDL Ratio: 3.6 Ratio
Triglycerides: 102 mg/dL (ref <150)
VLDL: 20 mg/dL (ref 0–40)

## 2012-05-29 LAB — TSH: TSH: 1.648 u[IU]/mL (ref 0.350–4.500)

## 2012-05-30 ENCOUNTER — Encounter: Payer: Self-pay | Admitting: Internal Medicine

## 2012-05-30 ENCOUNTER — Ambulatory Visit (INDEPENDENT_AMBULATORY_CARE_PROVIDER_SITE_OTHER): Payer: 59 | Admitting: Internal Medicine

## 2012-05-30 VITALS — BP 132/66 | HR 80 | Temp 99.3°F | Ht 66.0 in | Wt 262.0 lb

## 2012-05-30 DIAGNOSIS — K219 Gastro-esophageal reflux disease without esophagitis: Secondary | ICD-10-CM

## 2012-05-30 DIAGNOSIS — E785 Hyperlipidemia, unspecified: Secondary | ICD-10-CM

## 2012-05-30 DIAGNOSIS — Z8709 Personal history of other diseases of the respiratory system: Secondary | ICD-10-CM

## 2012-05-30 DIAGNOSIS — Z87891 Personal history of nicotine dependence: Secondary | ICD-10-CM

## 2012-05-30 DIAGNOSIS — J309 Allergic rhinitis, unspecified: Secondary | ICD-10-CM

## 2012-05-30 DIAGNOSIS — Z8669 Personal history of other diseases of the nervous system and sense organs: Secondary | ICD-10-CM

## 2012-05-30 DIAGNOSIS — Z0000 Encounter for general adult medical examination without abnormal findings: Secondary | ICD-10-CM

## 2012-05-30 DIAGNOSIS — E119 Type 2 diabetes mellitus without complications: Secondary | ICD-10-CM

## 2012-05-30 DIAGNOSIS — E669 Obesity, unspecified: Secondary | ICD-10-CM

## 2012-05-30 LAB — POCT URINALYSIS DIPSTICK
Bilirubin, UA: NEGATIVE
Glucose, UA: NEGATIVE
Ketones, UA: NEGATIVE
Leukocytes, UA: NEGATIVE
Nitrite, UA: NEGATIVE
Protein, UA: NEGATIVE
Spec Grav, UA: 1.015
Urobilinogen, UA: NEGATIVE
pH, UA: 6

## 2012-05-30 LAB — VITAMIN D 25 HYDROXY (VIT D DEFICIENCY, FRACTURES): Vit D, 25-Hydroxy: 39 ng/mL (ref 30–89)

## 2012-05-30 MED ORDER — GLIPIZIDE ER 10 MG PO TB24: ORAL_TABLET | ORAL | Status: DC

## 2012-05-30 NOTE — Patient Instructions (Addendum)
Increase Glipizide to 10 mg daily . Have AIC drawn at [facility].  Stop smoking.

## 2012-05-30 NOTE — Progress Notes (Signed)
Subjective:    Patient ID: Ashley Gilmore, female    DOB: [birth year], 47 y.o.   MRN: [id number]  HPI For health maintenance and evaluation of medical problems. Had Diabetic eye exam by Dr. [place] in January. Continues to smoke a half pack cigarettes daily. She did try Chantix at one point and quit smoking but resumed a month or so later. She's going to [facility] for glucose control. History of type 2 diabetes mellitus treated with Januvia and glipizide.  History of obesity, GE reflux, insomnia, migraine headaches, asthma. Sometimes has heavy menses lasting 4 days but still having monthly periods. Had oral surgery in 1976 with wisdom teeth extraction x4. Tubal ligation 2002.  Patient is allergic to penicillin causing shortness of breath and a rash.  Has had 2 pregnancies and no miscarriages.  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 History seasonal allergic rhinitis. Had gestational diabetes.  Social history: She is married. 2 daughters one of them is starting college. Does not consume alcohol. Husband as a Forensic scientist. Works as a Designer, multimedia at [employer]. 2 years of college.  Family history: Parents are alive. Father with history of hypertension and diabetes. Mother with history of hypertension and diabetes. One sister with history of hypertension. Mother is blind and lives 45 miles away.      Review of Systems  Constitutional: Positive for fatigue.  HENT: Negative.   Eyes: Negative.   Respiratory: Negative.   Cardiovascular: Negative.   Gastrointestinal:       GERD  Genitourinary: Negative.   Allergic/Immunologic: Positive for environmental allergies.  Neurological: Negative.   Hematological: Negative.   Psychiatric/Behavioral: Negative.        Objective:   Physical Exam  Vitals reviewed. Constitutional: She is oriented to person, place, and time. She appears well-developed and well-nourished. No distress.  HENT:  Head:  Normocephalic and atraumatic.  Right Ear: External ear normal.  Left Ear: External ear normal.  Mouth/Throat: Oropharynx is clear and moist. No oropharyngeal exudate.  Eyes: Conjunctivae and EOM are normal. Pupils are equal, round, and reactive to light. Right eye exhibits no discharge. Left eye exhibits no discharge. No scleral icterus.  Neck: Neck supple. No JVD present. No thyromegaly present.  Cardiovascular: Normal rate, regular rhythm, normal heart sounds and intact distal pulses.   No murmur heard. Pulmonary/Chest: Effort normal and breath sounds normal. No respiratory distress. She has no wheezes. She has no rales.  Breasts normal female  Abdominal: Soft. Bowel sounds are normal. She exhibits no distension and no mass. There is no tenderness. There is no rebound and no guarding.  Genitourinary:  Pap done 2013. Bimanual normal  Musculoskeletal: She exhibits no edema and no tenderness.  Lymphadenopathy:    She has no cervical adenopathy.  Neurological: She is alert and oriented to person, place, and time. She has normal reflexes. No cranial nerve deficit. Coordination normal.  Skin: Skin is warm and dry. No rash noted. She is not diaphoretic.  Psychiatric: She has a normal mood and affect. Her behavior is normal. Judgment and thought content normal.          Assessment & Plan:  Normal health maintenance exam  History of allergic rhinitis  Type 2 diabetes mellitus-controlled-blood pressure has been normal and I have not placed her on ACE inhibitor  History of smoking-willing to represcribe Chantix  GE reflux  Obesity-needs to diet and exercise  History of asthma  History of migraine  headaches  History of hyperlipidemia-lipids normal on low-dose Zocor  Plan: Continue encouraged diet exercise and weight loss. Continue counseling with [facility]. Stop smoking. Recommend annual mammogram. Obtain flu vaccine at work. Return in 6 months.

## 2012-07-13 ENCOUNTER — Other Ambulatory Visit: Payer: Self-pay | Admitting: Internal Medicine

## 2012-07-13 ENCOUNTER — Ambulatory Visit (INDEPENDENT_AMBULATORY_CARE_PROVIDER_SITE_OTHER): Payer: 59 | Admitting: Family Medicine

## 2012-07-13 DIAGNOSIS — E119 Type 2 diabetes mellitus without complications: Secondary | ICD-10-CM

## 2012-07-13 NOTE — Progress Notes (Signed)
Patient presents for 3 month follow up DM as part of the employee sponsored Link to [employer]. Medications have been reviewed. I have also discussed with patient lifestyle interventions such as diet and exercise. Full documentation of this visit can be found in the caretracker documenting system through [employer] [facility]). However specific areas of concern from this visit include:  1.) A1C 6.4 today, MD recently increased glipizide dose in May. Patient has been experiencing more frequent lows. Reviewed rule of 15 for hypoglycemia, provided patient with my direct line, and instructions to call me if they are becoming more frequent and we can switch to another oral (we specifically discussed Invokana). For now she wants to continue with the glipizide. She is having a long stretch between lunch and dinner and this is the time she goes low. She will cary a small snack with her. 2.) lipid panel has improved but her dose of simvastatin is not the moderate intensity dose that is recommended by the newest lipid guidelines. 20 mg would be a moderate intensity dose, will suggest to MD suggestions.   Patient has set a series of personal goals and will follow up in 3 months for further review of DM.

## 2012-08-08 NOTE — Progress Notes (Signed)
Patient ID: Ashley Gilmore, female   DOB: 05/04/1965, 47 y.o.   MRN: [id number] ATTENDING PHYSICIAN NOTE: I have reviewed the chart and agree with the plan as detailed above. Rozelia Catapano MD Pager [phone]  

## 2012-09-04 ENCOUNTER — Other Ambulatory Visit: Payer: Self-pay | Admitting: Internal Medicine

## 2012-11-07 ENCOUNTER — Ambulatory Visit (INDEPENDENT_AMBULATORY_CARE_PROVIDER_SITE_OTHER): Payer: Self-pay | Admitting: Family Medicine

## 2012-11-07 VITALS — Wt 263.0 lb

## 2012-11-07 DIAGNOSIS — E119 Type 2 diabetes mellitus without complications: Secondary | ICD-10-CM

## 2012-11-07 NOTE — Progress Notes (Signed)
Patient presents for 3 mo f/u DM as part of the employee sponsored Link to [employer]. Medications have been reviewed. I have also discussed with patient lifestyle interventions such as diet and exercise. Full documentation of this visit can be found in the [employer] documenting system through [facility]). However specifics from this visit include the following:  Diabetes Mellitus: POC A1C 6.5, at goal on glipizide, metformin, and januvia. No recommended changes. Has had a couple lows here and there but not very frequently.  Hyperlipidemia: Other not on a moderate intensity statin per 2013 ACC/AHA cholesterol guidelines which recommend moderate intensity for diabetics. Per National Lipid Association 2014 guidelines: she is diabetic with low HDL and a smoker so she is the very high risk category with non HDL goal <100 and LDL goal <70. Her non HDL is 111 and her LDL is 91, both not at goal. We need to at least get her simvastatin increased to 20 mg.   Misc: patient is a smoker and wants to quit. She wants to try bupropion plus patch. Smokes less than 10 cigs per day. Will get her the 14 mg patches and fax for an Rx for bupropion. Confirmed no h/o seizures. After receiving Rx for bupropion will plan on a quit date for 1 wk after initiation.  Cost Savings Intervention Outcomes: Medical problem identified; Supplies provided, proactive intervention  Patient has set a series of personal goals and will follow up in 3 months for further review of DM.

## 2012-11-14 ENCOUNTER — Other Ambulatory Visit: Payer: Self-pay | Admitting: Internal Medicine

## 2012-11-23 ENCOUNTER — Other Ambulatory Visit: Payer: Self-pay

## 2012-12-05 ENCOUNTER — Other Ambulatory Visit: Payer: 59 | Admitting: Internal Medicine

## 2012-12-05 DIAGNOSIS — Z79899 Other long term (current) drug therapy: Secondary | ICD-10-CM

## 2012-12-05 DIAGNOSIS — E785 Hyperlipidemia, unspecified: Secondary | ICD-10-CM

## 2012-12-05 DIAGNOSIS — E119 Type 2 diabetes mellitus without complications: Secondary | ICD-10-CM

## 2012-12-05 LAB — LIPID PANEL
Cholesterol: 138 mg/dL (ref 0–200)
HDL: 39 mg/dL — ABNORMAL LOW (ref >39)
LDL Cholesterol: 82 mg/dL (ref 0–99)
Total CHOL/HDL Ratio: 3.5 Ratio
Triglycerides: 87 mg/dL (ref <150)
VLDL: 17 mg/dL (ref 0–40)

## 2012-12-05 LAB — HEPATIC FUNCTION PANEL
ALT: 12 U/L (ref 0–35)
AST: 12 U/L (ref 0–37)
Albumin: 3.8 g/dL (ref 3.5–5.2)
Alkaline Phosphatase: 85 U/L (ref 39–117)
Bilirubin, Direct: 0.1 mg/dL (ref 0.0–0.3)
Indirect Bilirubin: 0.3 mg/dL (ref 0.0–0.9)
Total Bilirubin: 0.4 mg/dL (ref 0.3–1.2)
Total Protein: 6.1 g/dL (ref 6.0–8.3)

## 2012-12-05 LAB — HEMOGLOBIN A1C
Hgb A1c MFr Bld: 7.1 % — ABNORMAL HIGH (ref <5.7)
Mean Plasma Glucose: 157 mg/dL — ABNORMAL HIGH (ref <117)

## 2012-12-07 ENCOUNTER — Ambulatory Visit (INDEPENDENT_AMBULATORY_CARE_PROVIDER_SITE_OTHER): Payer: 59 | Admitting: Internal Medicine

## 2012-12-07 ENCOUNTER — Encounter: Payer: Self-pay | Admitting: Internal Medicine

## 2012-12-07 VITALS — BP 132/82 | HR 92 | Temp 98.9°F | Ht 67.0 in | Wt 264.0 lb

## 2012-12-07 DIAGNOSIS — Z87891 Personal history of nicotine dependence: Secondary | ICD-10-CM

## 2012-12-07 DIAGNOSIS — E669 Obesity, unspecified: Secondary | ICD-10-CM

## 2012-12-07 DIAGNOSIS — E119 Type 2 diabetes mellitus without complications: Secondary | ICD-10-CM

## 2012-12-07 DIAGNOSIS — E785 Hyperlipidemia, unspecified: Secondary | ICD-10-CM

## 2012-12-07 DIAGNOSIS — K219 Gastro-esophageal reflux disease without esophagitis: Secondary | ICD-10-CM

## 2013-01-15 ENCOUNTER — Other Ambulatory Visit: Payer: Self-pay | Admitting: Internal Medicine

## 2013-01-29 NOTE — Progress Notes (Signed)
Patient ID: Ashley Gilmore, female   DOB: 11/25/1965, 48 y.o.   MRN: [id number] ATTENDING PHYSICIAN NOTE: I have reviewed the chart and agree with the plan as detailed above. Dorcas Mcmurray MD Pager [phone]

## 2013-02-13 ENCOUNTER — Ambulatory Visit (INDEPENDENT_AMBULATORY_CARE_PROVIDER_SITE_OTHER): Payer: Self-pay | Admitting: Family Medicine

## 2013-02-13 DIAGNOSIS — E119 Type 2 diabetes mellitus without complications: Secondary | ICD-10-CM

## 2013-02-13 NOTE — Progress Notes (Signed)
Patient presents for 3 mo f/u DM as part of the employee sponsored Link to [employer]. Medications have been reviewed. I have also discussed with patient lifetsyle interventions such as diet and exercise. Full documentation of this visit can be found in the caretracker documenting system through [facility]). However specifics from this visit include the following:  Diabetes Mellitus: POC A1C 6.8, at goal on januvia and glipizide. No recommended changes. Discussed using myfitnesspal for track calories and work on exercising 150 mins/week.  Hyperlipidemia: on simvastatin 10 mg which is not a moderate intensity dose but MD is satisfied with this dose.   Hypertension: BP elevated today. Never had BP issues before. Will have patient monitor at home. i will be following her for smoking cessation so I will f/u on this.  smoking cessation: has been on wellbutrin XL 150 mg daily which isn't the correct dosing for smoking cessation. I will add 14 my patches for better efficacy and will f/u with patient next week. Last time she tried them, they worked but she just didn't wear them consistently. I will start following her more closely so she can earn her smoking cessation badge from [facility] and get the payout in September.  Cost Savings Intervention Outcomes: Supplies provided, proactive intervention  Patient has set a series of personal goals and will f/u in 3 mo for further review of DM

## 2013-02-19 ENCOUNTER — Other Ambulatory Visit: Payer: Self-pay | Admitting: Internal Medicine

## 2013-03-05 ENCOUNTER — Other Ambulatory Visit: Payer: Self-pay

## 2013-03-05 ENCOUNTER — Other Ambulatory Visit: Payer: Self-pay | Admitting: Internal Medicine

## 2013-03-05 MED ORDER — ALPRAZOLAM 0.25 MG PO TABS: 0.2500 mg | ORAL_TABLET | Freq: Every day | ORAL | Status: DC | PRN

## 2013-03-05 NOTE — Telephone Encounter (Signed)
Refill x 6 months 

## 2013-03-18 NOTE — Progress Notes (Signed)
   Subjective:    Patient ID: Ashley Gilmore, female    DOB: [birth year], 48 y.o.   MRN: [id number]  HPI  Patient here for six-month followup. She also needs to have bimanual exam. Had Pap smear in 2013. She has a history of diabetes mellitus, hyperlipidemia, asthma, obesity, GE reflux, history of smoking. Is smoking less than a quarter pack per day. Has callus right foot. Has received influenza immunization through employment. Received help with diabetes through [facility]. Is on Januvia, glipizide, Zocor. Takes generic Protonix for GE reflux. Reminded regarding yearly eye exam. Lipid panel is normal on statin therapy. Hemoglobin A1c 7.1%   Review of Systems     Objective:   Physical Exam Neck is supple without JVD thyromegaly or carotid bruits. Chest clear to auscultation. Cardiac exam regular rate and rhythm normal S1 and S2. Extremities without edema. Diabetic foot exam shows only callus right foot. Bimanual exam no masses       Assessment & Plan:  Controlled type 2 diabetes-continue Januvia glipizide  Hyperlipidemia-continue Zocor. Watch diet  History of smoking-continue smoking cessation efforts  Obesity-talked with her about diet and exercise  History of insomnia for which she takes Xanax sparingly  Plan: Recommend yearly eye exam and return in 6 months for physical examination

## 2013-03-18 NOTE — Patient Instructions (Addendum)
Continue same medications, please quit smoking, diet and exercise. Return in 6 months.

## 2013-03-27 NOTE — Progress Notes (Signed)
Patient ID: Ashley Gilmore, female   DOB: 04/21/1965, 48 y.o.   MRN: [id number] ATTENDING PHYSICIAN NOTE: I have reviewed the chart and agree with the plan as detailed above. Sara Neal MD Pager [phone]  

## 2013-05-17 ENCOUNTER — Ambulatory Visit (INDEPENDENT_AMBULATORY_CARE_PROVIDER_SITE_OTHER): Payer: Self-pay | Admitting: Family Medicine

## 2013-05-17 VITALS — BP 140/93 | HR 90 | Wt 269.0 lb

## 2013-05-17 DIAGNOSIS — E119 Type 2 diabetes mellitus without complications: Secondary | ICD-10-CM

## 2013-05-17 NOTE — Progress Notes (Signed)
Patient presents for 3 mo f/u DM as part of the employee sponsored Link to [employer]. Medications have been reviewed. I have also discussed with patient lifestyle interventions such as diet and exercise. Full documentation of this visit can be found in the [employer] documenting system through [facility]). However specifics from this visit include the following:  Diabetes Mellitus:POC A1C 6.8, at goal on metformin, januvia, glipizide. No recommended changes. plan 1.) need to schedule an eye exam 2.) needs to stop smoking. Said she doesn't want to try right now. Wants to wait until June after her daughter graduates high school 3.) f/u 3 mo  Hypertension:BP elevated today at 140/93.Was elevated last time too. Not on BP medicine. Will fax md to let her know it's been running high. She is due for a visit in may. Patient will check at home for the next couple weeks and I will call her to see how it is.  Patient has set a series of personal goals and will f/u in 3 mo for further review of DM

## 2013-06-04 ENCOUNTER — Other Ambulatory Visit: Payer: Self-pay | Admitting: Internal Medicine

## 2013-07-09 ENCOUNTER — Other Ambulatory Visit: Payer: 59 | Admitting: Internal Medicine

## 2013-07-09 DIAGNOSIS — E119 Type 2 diabetes mellitus without complications: Secondary | ICD-10-CM

## 2013-07-09 DIAGNOSIS — Z1329 Encounter for screening for other suspected endocrine disorder: Secondary | ICD-10-CM

## 2013-07-09 DIAGNOSIS — Z13228 Encounter for screening for other metabolic disorders: Secondary | ICD-10-CM

## 2013-07-09 DIAGNOSIS — E785 Hyperlipidemia, unspecified: Secondary | ICD-10-CM

## 2013-07-09 DIAGNOSIS — Z79899 Other long term (current) drug therapy: Secondary | ICD-10-CM

## 2013-07-09 DIAGNOSIS — Z130 Encounter for screening for diseases of the blood and blood-forming organs and certain disorders involving the immune mechanism: Secondary | ICD-10-CM

## 2013-07-09 LAB — CBC WITH DIFFERENTIAL/PLATELET
Basophils Absolute: 0.1 10*3/uL (ref 0.0–0.1)
Basophils Relative: 1 % (ref 0–1)
Eosinophils Absolute: 0.2 10*3/uL (ref 0.0–0.7)
Eosinophils Relative: 2 % (ref 0–5)
HCT: 43.2 % (ref 36.0–46.0)
Hemoglobin: 14.6 g/dL (ref 12.0–15.0)
Lymphocytes Relative: 28 % (ref 12–46)
Lymphs Abs: 2.4 10*3/uL (ref 0.7–4.0)
MCH: 27.9 pg (ref 26.0–34.0)
MCHC: 33.8 g/dL (ref 30.0–36.0)
MCV: 82.6 fL (ref 78.0–100.0)
Monocytes Absolute: 0.4 10*3/uL (ref 0.1–1.0)
Monocytes Relative: 5 % (ref 3–12)
Neutro Abs: 5.4 10*3/uL (ref 1.7–7.7)
Neutrophils Relative %: 64 % (ref 43–77)
Platelets: 294 10*3/uL (ref 150–400)
RBC: 5.23 MIL/uL — ABNORMAL HIGH (ref 3.87–5.11)
RDW: 13.9 % (ref 11.5–15.5)
WBC: 8.5 10*3/uL (ref 4.0–10.5)

## 2013-07-09 LAB — COMPREHENSIVE METABOLIC PANEL
ALT: 20 U/L (ref 0–35)
AST: 14 U/L (ref 0–37)
Albumin: 3.8 g/dL (ref 3.5–5.2)
Alkaline Phosphatase: 110 U/L (ref 39–117)
BUN: 10 mg/dL (ref 6–23)
CO2: 32 mEq/L (ref 19–32)
Calcium: 9.2 mg/dL (ref 8.4–10.5)
Chloride: 102 mEq/L (ref 96–112)
Creat: 0.74 mg/dL (ref 0.50–1.10)
Glucose, Bld: 173 mg/dL — ABNORMAL HIGH (ref 70–99)
Potassium: 4.4 mEq/L (ref 3.5–5.3)
Sodium: 140 mEq/L (ref 135–145)
Total Bilirubin: 0.4 mg/dL (ref 0.2–1.2)
Total Protein: 6.3 g/dL (ref 6.0–8.3)

## 2013-07-09 LAB — HEMOGLOBIN A1C
Hgb A1c MFr Bld: 8 % — ABNORMAL HIGH (ref <5.7)
Mean Plasma Glucose: 183 mg/dL — ABNORMAL HIGH (ref <117)

## 2013-07-09 LAB — LIPID PANEL
Cholesterol: 165 mg/dL (ref 0–200)
HDL: 50 mg/dL (ref >39)
LDL Cholesterol: 90 mg/dL (ref 0–99)
Total CHOL/HDL Ratio: 3.3 Ratio
Triglycerides: 124 mg/dL (ref <150)
VLDL: 25 mg/dL (ref 0–40)

## 2013-07-09 LAB — TSH: TSH: 2.663 u[IU]/mL (ref 0.350–4.500)

## 2013-07-10 ENCOUNTER — Encounter: Payer: Self-pay | Admitting: Internal Medicine

## 2013-07-10 ENCOUNTER — Other Ambulatory Visit (HOSPITAL_COMMUNITY)
Admission: RE | Admit: 2013-07-10 | Discharge: 2013-07-10 | Disposition: A | Discharge status: Home / Self Care | Payer: 59 | Source: Ambulatory Visit | Attending: Internal Medicine | Admitting: Internal Medicine

## 2013-07-10 ENCOUNTER — Ambulatory Visit (INDEPENDENT_AMBULATORY_CARE_PROVIDER_SITE_OTHER): Payer: 59 | Admitting: Internal Medicine

## 2013-07-10 VITALS — BP 142/66 | HR 84 | Temp 98.8°F | Ht 66.5 in | Wt 265.0 lb

## 2013-07-10 DIAGNOSIS — E669 Obesity, unspecified: Secondary | ICD-10-CM

## 2013-07-10 DIAGNOSIS — Z87891 Personal history of nicotine dependence: Secondary | ICD-10-CM

## 2013-07-10 DIAGNOSIS — Z01419 Encounter for gynecological examination (general) (routine) without abnormal findings: Secondary | ICD-10-CM | POA: Insufficient documentation

## 2013-07-10 DIAGNOSIS — E1165 Type 2 diabetes mellitus with hyperglycemia: Secondary | ICD-10-CM

## 2013-07-10 DIAGNOSIS — IMO0001 Reserved for inherently not codable concepts without codable children: Secondary | ICD-10-CM

## 2013-07-10 DIAGNOSIS — K219 Gastro-esophageal reflux disease without esophagitis: Secondary | ICD-10-CM

## 2013-07-10 DIAGNOSIS — Z0000 Encounter for general adult medical examination without abnormal findings: Secondary | ICD-10-CM

## 2013-07-10 DIAGNOSIS — Z8709 Personal history of other diseases of the respiratory system: Secondary | ICD-10-CM

## 2013-07-10 DIAGNOSIS — Z8669 Personal history of other diseases of the nervous system and sense organs: Secondary | ICD-10-CM

## 2013-07-10 LAB — POCT URINALYSIS DIPSTICK
Bilirubin, UA: NEGATIVE
Blood, UA: NEGATIVE
Ketones, UA: NEGATIVE
Leukocytes, UA: NEGATIVE
Nitrite, UA: NEGATIVE
Protein, UA: NEGATIVE
Spec Grav, UA: >=1.03
Urobilinogen, UA: NEGATIVE
pH, UA: 6

## 2013-07-10 LAB — VITAMIN D 25 HYDROXY (VIT D DEFICIENCY, FRACTURES): Vit D, 25-Hydroxy: 50 ng/mL (ref 30–89)

## 2013-07-11 LAB — MICROALBUMIN, URINE: Microalb, Ur: 0.5 mg/dL (ref 0.00–1.89)

## 2013-07-12 LAB — CYTOLOGY - PAP

## 2013-07-17 ENCOUNTER — Other Ambulatory Visit: Payer: Self-pay | Admitting: Internal Medicine

## 2013-08-09 NOTE — Progress Notes (Signed)
Patient ID: Ashley Gilmore, female   DOB: [birth year], 48 y.o.   MRN: [id number] ATTENDING PHYSICIAN NOTE: I have reviewed the chart and agree with the plan as detailed above. Dorcas Mcmurray MD Pager [phone]

## 2013-08-23 ENCOUNTER — Ambulatory Visit (INDEPENDENT_AMBULATORY_CARE_PROVIDER_SITE_OTHER): Payer: Self-pay | Admitting: Family Medicine

## 2013-08-23 VITALS — BP 129/80 | HR 76 | Wt 263.0 lb

## 2013-08-23 DIAGNOSIS — E119 Type 2 diabetes mellitus without complications: Secondary | ICD-10-CM

## 2013-08-23 NOTE — Progress Notes (Signed)
Patient presents for 3 mo fu DM as part of the employee sponsored Link to [employer]. Medications have been reviewed. I have also discussed with patient lifestyle interventions such as diet and exercise. Full documentation of this visit can be found in the [employer] documenting system through [facility]). However, specifics from this visit include the following:  POC A1C 7.4, above goal. Was 8 at [place] office in June. plan 1.) pt not interested in injections which rules out GLP-1 or insulin. Could go up on glipizide dose to 20 mg but usually if effect of sulfonylureas not achieved at 1/2 the max dose, doubling doesn't help that much. Could try it for now and if it doesn't work would recommend adding invokana or metformin ER. Patient has been on metformin in the past but it was immediate release and it caused a lot of GI distress. She would be willing to try the ER or invokana. 2.) will fax MD recommendations and see what she wants to do. Appt scheduled for 3 mo but will f/u with patient when I hear back from her doctor. 3.) patient lost meter. Will get her a new one. 4.) Not really checking blood sugar. Asked patient to start checking fasting (goal 80-130) and 2 h PPG after random meals ( goal <180) Hypertension: was elevated last visit and elevated at md visit but is fine today. Will monitor  Patient has set a series of personal goals and will f/u in 3 mo for further review of DM

## 2013-08-28 ENCOUNTER — Encounter: Payer: Self-pay | Admitting: Family Medicine

## 2013-08-28 NOTE — Progress Notes (Signed)
Patient ID: Ashley Gilmore, female   DOB: [birth year], 48 y.o.   MRN: [id number] Reviewed: Agree with documentation and management by our pharmacologist.

## 2013-09-05 ENCOUNTER — Other Ambulatory Visit: Payer: Self-pay | Admitting: Internal Medicine

## 2013-09-07 ENCOUNTER — Encounter: Payer: Self-pay | Admitting: Internal Medicine

## 2013-09-07 ENCOUNTER — Ambulatory Visit (INDEPENDENT_AMBULATORY_CARE_PROVIDER_SITE_OTHER): Payer: 59 | Admitting: Internal Medicine

## 2013-09-07 ENCOUNTER — Other Ambulatory Visit: Payer: Self-pay

## 2013-09-07 VITALS — BP 140/76 | HR 84 | Wt 264.0 lb

## 2013-09-07 DIAGNOSIS — Z87891 Personal history of nicotine dependence: Secondary | ICD-10-CM

## 2013-09-07 DIAGNOSIS — E669 Obesity, unspecified: Secondary | ICD-10-CM

## 2013-09-07 DIAGNOSIS — N926 Irregular menstruation, unspecified: Secondary | ICD-10-CM

## 2013-09-07 DIAGNOSIS — N912 Amenorrhea, unspecified: Secondary | ICD-10-CM

## 2013-09-07 DIAGNOSIS — E119 Type 2 diabetes mellitus without complications: Secondary | ICD-10-CM

## 2013-09-07 NOTE — Patient Instructions (Addendum)
New order for Accu-Chek machine given. Keep Accu-Cheks a.c. and at bedtime for 5 days and call with results. Consider Invokana as recommended by pharmacologist if Accu-Cheks are significantly elevated. Once again encouraged diet exercise and weight loss. Continue to monitor blood pressure. If elevated please call.

## 2013-09-08 LAB — FOLLICLE STIMULATING HORMONE: FSH: 44.7 m[IU]/mL

## 2013-09-20 ENCOUNTER — Other Ambulatory Visit: Payer: Self-pay | Admitting: Internal Medicine

## 2013-09-20 NOTE — Telephone Encounter (Signed)
Refill x 6 months 

## 2013-09-29 ENCOUNTER — Encounter: Payer: Self-pay | Admitting: Internal Medicine

## 2013-09-29 NOTE — Patient Instructions (Addendum)
Stop smoking. Continue diet and exercise efforts. Continue med link counseling for diabetes. Blood pressure is elevated today. Consider ACE inhibitor. Patient to monitor blood pressure at work and let he know if persistently elevated. She does not want to start additional diabetic medication.

## 2013-09-29 NOTE — Progress Notes (Signed)
   Subjective:    Patient ID: Ashley Gilmore, female    DOB: 1965/07/20, 48 y.o.   MRN: [id number]  HPI 48 year old White Female in today for health maintenance exam and evaluation of medical issues. She is these mellitus and obesity. History of GE reflux insomnia migraine headaches and asthma. Still having monthly periods. She goes to med link for glucose control. Has been treated which is new via and glipizide. Doesn't exercise.  Past medical history: Oral surgery 1976 wisdom teeth extraction x4. Bilateral tubal ligation 2002. She has had 2 pregnancies and no miscarriages. 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 History of seasonal allergic rhinitis. Had gestational diabetes.  She is allergic to penicillin it causes shortness of breath and a rash.  Social history: She is married. 2 daughters both attending [employer]. Does not consume alcohol. Husband is a Chemical engineer. She works as a Clinical biochemist at [employer]. 2 years of college.   Family history: Parents are alive. Father with history of hypertension and diabetes. Mother with history of hypertension and diabetes. One sister with history of hypertension. Mother is blind.    Review of Systems  Constitutional: Positive for fatigue.  Respiratory:       Of wheezing with respiratory infection  Gastrointestinal:       History of GE reflux  Allergic/Immunologic: Positive for environmental allergies.  Neurological:       History of migraine headaches  Hematological: Negative.        Objective:   Physical Exam  Vitals reviewed. Constitutional: She is oriented to person, place, and time. She appears well-developed and well-nourished. No distress.  HENT:  Head: Normocephalic and atraumatic.  Right Ear: External ear normal.  Left Ear: External ear normal.  Nose: Nose normal.  Mouth/Throat: Oropharynx is clear and moist. No oropharyngeal exudate.  Eyes: Conjunctivae and EOM are  normal. Pupils are equal, round, and reactive to light. Right eye exhibits no discharge. Left eye exhibits no discharge.  Neck: Neck supple. No JVD present. No thyromegaly present.  Cardiovascular: Normal rate, regular rhythm, normal heart sounds and intact distal pulses.   No murmur heard. Pulmonary/Chest: Effort normal and breath sounds normal.  Breasts normal female  Abdominal: Soft. Bowel sounds are normal. She exhibits no distension and no mass. There is no tenderness. There is no rebound and no guarding.  Genitourinary:  Pap taken. Bimanual normal.  Musculoskeletal: Normal range of motion. She exhibits no edema.  Lymphadenopathy:    She has no cervical adenopathy.  Neurological: She is alert and oriented to person, place, and time. She has normal reflexes. No cranial nerve deficit. Coordination normal.  Skin: Skin is warm and dry. No rash noted. She is not diaphoretic. No erythema.  Psychiatric: She has a normal mood and affect. Her behavior is normal. Judgment and thought content normal.          Assessment & Plan:  Normal health maintenance exam  Type 2 diabetes mellitus  Obesity  GE reflux  Allergic rhinitis  History of asthma  History of smoking  History of migraine headaches  Plan once again advised to stop smoking. Needs to diet and exercise. Continue med link consultation for diabetic control. Return in 6 months.

## 2013-09-29 NOTE — Progress Notes (Signed)
   Subjective:    Patient ID: Ashley Gilmore, female    DOB: 1965-03-19, 48 y.o.   MRN: [id number]  HPI  Here today to followup on diabetes and blood pressure. Blood pressure remains elevated. She says she gets anxious when she comes to the office. Says she will monitor it at work. She will let he know if it's persistently elevated. I still think she needs to be on an ACE inhibitor. She is reluctant to start more medication. Invokanna, was recommended by Dr. Andria Frames through med link but she is reluctant to take that. Does not have a new Accu-Chek machine. New order. He also gave her an order when she saw him in early August. Says she was to get her children off to college and settled before she starts work on herself. She's been having some amenorrhea and is worried about menopause. [place] drawn today.   Review of Systems     Objective:   Physical Exam Neck is supple. Chest clear. Cardiac exam regular rate and rhythm normal S1 and S2. Skin is without edema. Diabetic foot exam no ulcers or calluses       Assessment & Plan:  Irregular menses  Type 2 diabetes mellitus-could be under better control  Obesity-needs to diet and exercise  History of smoking-needs to quit  Elevated blood pressure likely needs ACE inhibitor or ARB  Plan: Patient has appointment to return in December or may be seen sooner if blood pressure remains elevated. Based purchased new Accu-Chek machine and keep track of diabetes.  25 minutes spent with patient   [facility] c/w perimenopause/menopause

## 2013-11-26 ENCOUNTER — Other Ambulatory Visit: Payer: Self-pay | Admitting: Internal Medicine

## 2013-11-28 ENCOUNTER — Ambulatory Visit (INDEPENDENT_AMBULATORY_CARE_PROVIDER_SITE_OTHER): Payer: Self-pay | Admitting: Family Medicine

## 2013-11-28 ENCOUNTER — Other Ambulatory Visit: Payer: Self-pay | Admitting: Internal Medicine

## 2013-11-28 VITALS — BP 150/79 | HR 87 | Wt 273.0 lb

## 2013-11-28 DIAGNOSIS — E119 Type 2 diabetes mellitus without complications: Secondary | ICD-10-CM

## 2013-11-28 NOTE — Progress Notes (Signed)
Patient presents for 3 mo f/u DM as part of the employee sponsored Link to [employer]. Medications have been reviewed. I have also discussed with patient lifestyle interventions such as diet and exercise. Full documentation of this visit can be found in the [employer] documenting system through [facility]). However, specific area of concerns from this visit include the following:  1.) Had long discussion with patient about treatment options and what she is willing to try. I recommend adding invokana and discontinuing Januvia and swapping it for once weekly bydureon for better A1C lowering and both invokana and bydureon will aid in wt loss. Counseled patient on side effects of each. Will fax MD with recs. 2.) MD will need to change her to contour meter and supplies before insurance change in January (one touch will no longer be covered for a free copay). 3.) f/u via phone with patient after MD appt 12/17 and then will f/u based on changes made at that time.  Hyperlipidemia: Simvastatin is not at the moderate intensity dose recommended for diabetics. I suggest increasing this to 20 mg.  Hypertension:BP elevated again and was elevated at last MD office visit. Previously she would teeter on the systolic edge for high but now with the weight gain she is definitely high (403 systolic). I suggest adding HCTZ as she takes ibuprofen 600 mg three times daily scheduled for arthritis related pain which wouldn't be a good combination with lisinopril or losartan. I will also suggest to patient to switch to naproxen as this may have less cardiovascular effects.    Patient has set a series of personal goals and will f/u in 1 month via phone after md visit and in 3 mo for A1C check.

## 2014-01-01 ENCOUNTER — Other Ambulatory Visit: Payer: Self-pay | Admitting: Internal Medicine

## 2014-01-01 LAB — HEPATIC FUNCTION PANEL
ALT: 16 U/L (ref 0–53)
AST: 17 U/L (ref 0–37)
Albumin: 3.7 g/dL (ref 3.5–5.2)
Alkaline Phosphatase: 102 U/L (ref 39–117)
Bilirubin, Direct: 0.1 mg/dL (ref 0.0–0.3)
Indirect Bilirubin: 0.4 mg/dL (ref 0.2–1.2)
Total Bilirubin: 0.5 mg/dL (ref 0.2–1.2)
Total Protein: 6.5 g/dL (ref 6.0–8.3)

## 2014-01-01 LAB — LIPID PANEL
Cholesterol: 172 mg/dL (ref 0–200)
HDL: 43 mg/dL (ref >39)
LDL Cholesterol: 101 mg/dL — ABNORMAL HIGH (ref 0–99)
Total CHOL/HDL Ratio: 4 Ratio
Triglycerides: 142 mg/dL (ref <150)
VLDL: 28 mg/dL (ref 0–40)

## 2014-01-02 LAB — HEMOGLOBIN A1C
Hgb A1c MFr Bld: 9.3 % — ABNORMAL HIGH (ref <5.7)
Mean Plasma Glucose: 220 mg/dL — ABNORMAL HIGH (ref <117)

## 2014-01-03 ENCOUNTER — Telehealth: Payer: Self-pay | Admitting: *Deleted

## 2014-01-03 ENCOUNTER — Ambulatory Visit (INDEPENDENT_AMBULATORY_CARE_PROVIDER_SITE_OTHER): Payer: 59 | Admitting: Internal Medicine

## 2014-01-03 ENCOUNTER — Encounter: Payer: Self-pay | Admitting: Internal Medicine

## 2014-01-03 VITALS — BP 138/76 | HR 80 | Wt 261.0 lb

## 2014-01-03 DIAGNOSIS — Z87891 Personal history of nicotine dependence: Secondary | ICD-10-CM

## 2014-01-03 DIAGNOSIS — E8881 Metabolic syndrome: Secondary | ICD-10-CM

## 2014-01-03 DIAGNOSIS — E669 Obesity, unspecified: Secondary | ICD-10-CM

## 2014-01-03 DIAGNOSIS — E119 Type 2 diabetes mellitus without complications: Secondary | ICD-10-CM

## 2014-01-03 DIAGNOSIS — E785 Hyperlipidemia, unspecified: Secondary | ICD-10-CM | POA: Diagnosis not present

## 2014-01-03 DIAGNOSIS — I10 Essential (primary) hypertension: Secondary | ICD-10-CM

## 2014-01-03 DIAGNOSIS — Z720 Tobacco use: Secondary | ICD-10-CM

## 2014-01-03 MED ORDER — LANCETS MISC: 1.0000 "application " | Freq: Four times a day (QID) | Status: DC

## 2014-01-03 MED ORDER — FREESTYLE SYSTEM KIT: 1.0000 | PACK | Freq: Four times a day (QID) | Status: DC

## 2014-01-03 MED ORDER — LOSARTAN POTASSIUM 50 MG PO TABS: 50.0000 mg | ORAL_TABLET | Freq: Every day | ORAL | Status: DC

## 2014-01-03 MED ORDER — GLUCOSE BLOOD VI STRP: ORAL_STRIP | Status: DC

## 2014-01-03 NOTE — Telephone Encounter (Signed)
Called Endocrinology regarding referral information faxed to [phone] they will review and contact pt with appt time

## 2014-01-07 ENCOUNTER — Other Ambulatory Visit: Payer: Self-pay | Admitting: Internal Medicine

## 2014-01-07 NOTE — Progress Notes (Signed)
Patient ID: Ashley Gilmore, female   DOB: 1965-10-19, 48 y.o.   MRN: [id number] Reviewed: Agree with the documentation and management of our [place].

## 2014-01-19 ENCOUNTER — Telehealth: Payer: Self-pay | Admitting: Internal Medicine

## 2014-01-19 NOTE — Telephone Encounter (Signed)
Pt notified of results by phone today. Lipids stable.

## 2014-01-19 NOTE — Telephone Encounter (Signed)
Pt has rising AIC 9.3% and has appt with endocrinologist January 14.

## 2014-01-31 ENCOUNTER — Telehealth: Payer: Self-pay | Admitting: *Deleted

## 2014-01-31 ENCOUNTER — Encounter: Payer: Self-pay | Admitting: Internal Medicine

## 2014-01-31 ENCOUNTER — Ambulatory Visit (INDEPENDENT_AMBULATORY_CARE_PROVIDER_SITE_OTHER): Payer: Commercial Managed Care - PPO | Admitting: Internal Medicine

## 2014-01-31 VITALS — BP 126/72 | HR 106 | Temp 97.4°F | Resp 12 | Ht 67.0 in | Wt 269.8 lb

## 2014-01-31 DIAGNOSIS — E1165 Type 2 diabetes mellitus with hyperglycemia: Secondary | ICD-10-CM

## 2014-01-31 DIAGNOSIS — IMO0001 Reserved for inherently not codable concepts without codable children: Secondary | ICD-10-CM

## 2014-01-31 MED ORDER — GLUCOPHAGE XR 500 MG PO TB24: 1000.0000 mg | ORAL_TABLET | Freq: Two times a day (BID) | ORAL | Status: DC

## 2014-01-31 NOTE — Telephone Encounter (Signed)
Patient stated the cost of the Glucophage is $75 and she can't afford it, she would like to try the generic brand. Please advise

## 2014-01-31 NOTE — Patient Instructions (Signed)
Please continue the following meds, but move them to before breakfast: - Glipizide XL 10 mg  - Januvia 100 mg   Please start Metformin XR 500 mg with dinner x 4 days. If you tolerate this well, add another Metformin tablet (500 mg) with breakfast x 4 days. If you tolerate this well, add another metformin tablet with dinner (total 1000 mg) x 4 days. If you tolerate this well, add another metformin tablet with breakfast (total 1000 mg). Continue with 1000 mg of metformin 2x a day with breakfast and dinner.  Please return in 1 month with your sugar log.   PATIENT INSTRUCTIONS FOR TYPE 2 DIABETES:  DIET AND EXERCISE Diet and exercise is an important part of diabetic treatment.  We recommended aerobic exercise in the form of brisk walking (working between 40-60% of maximal aerobic capacity, similar to brisk walking) for 150 minutes per week (such as 30 minutes five days per week) along with 3 times per week performing 'resistance' training (using various gauge rubber tubes with handles) 5-10 exercises involving the major muscle groups (upper body, lower body and core) performing 10-15 repetitions (or near fatigue) each exercise. Start at half the above goal but build slowly to reach the above goals. If limited by weight, joint pain, or disability, we recommend daily walking in a swimming pool with water up to waist to reduce pressure from joints while allow for adequate exercise.    BLOOD GLUCOSES Monitoring your blood glucoses is important for continued management of your diabetes. Please check your blood glucoses 2-4 times a day: fasting, before meals and at bedtime (you can rotate these measurements - e.g. one day check before the 3 meals, the next day check before 2 of the meals and before bedtime, etc.).   HYPOGLYCEMIA (low blood sugar) Hypoglycemia is usually a reaction to not eating, exercising, or taking too much insulin/ other diabetes drugs.  Symptoms include tremors, sweating, hunger,  confusion, headache, etc. Treat IMMEDIATELY with 15 grams of Carbs: . 4 glucose tablets .  cup regular juice/soda . 2 tablespoons raisins . 4 teaspoons sugar . 1 tablespoon honey Recheck blood glucose in 15 mins and repeat above if still symptomatic/blood glucose <100.  RECOMMENDATIONS TO REDUCE YOUR RISK OF DIABETIC COMPLICATIONS: * Take your prescribed MEDICATION(S) * Follow a DIABETIC diet: Complex carbs, fiber rich foods, (monounsaturated and polyunsaturated) fats * AVOID saturated/trans fats, high fat foods, >2,300 mg salt per day. * EXERCISE at least 5 times a week for 30 minutes or preferably daily.  * DO NOT SMOKE OR DRINK more than 1 drink a day. * Check your FEET every day. Do not wear tightfitting shoes. Contact us if you develop an ulcer * See your EYE doctor once a year or more if needed * Get a FLU shot once a year * Get a PNEUMONIA vaccine once before and once after age 67 years  GOALS:  * Your Hemoglobin A1c of <7%  * fasting sugars need to be <130 * after meals sugars need to be <180 (2h after you start eating) * Your Systolic BP should be 195 or lower  * Your Diastolic BP should be 80 or lower  * Your HDL (Good Cholesterol) should be 40 or higher  * Your LDL (Bad Cholesterol) should be 100 or lower. * Your Triglycerides should be 150 or lower  * Your Urine microalbumin (kidney function) should be <30 * Your Body Mass Index should be 25 or lower    Please consider the  following ways to cut down carbs and fat and increase fiber and micronutrients in your diet: - substitute whole grain for white bread or pasta - substitute brown rice for white rice - substitute 90-calorie flat bread pieces for slices of bread when possible - substitute sweet potatoes or yams for white potatoes - substitute humus for margarine - substitute tofu for cheese when possible - substitute almond or rice milk for regular milk (would not drink soy milk daily due to concern for soy  estrogen influence on breast cancer risk) - substitute dark chocolate for other sweets when possible - substitute water - can add lemon or orange slices for taste - for diet sodas (artificial sweeteners will trick your body that you can eat sweets without getting calories and will lead you to overeating and weight gain in the long run) - do not skip breakfast or other meals (this will slow down the metabolism and will result in more weight gain over time)  - can try smoothies made from fruit and almond/rice milk in am instead of regular breakfast - can also try old-fashioned (not instant) oatmeal made with almond/rice milk in am - order the dressing on the side when eating salad at a restaurant (pour less than half of the dressing on the salad) - eat as little meat as possible - can try juicing, but should not forget that juicing will get rid of the fiber, so would alternate with eating raw veg./fruits or drinking smoothies - use as little oil as possible, even when using olive oil - can dress a salad with a mix of balsamic vinegar and lemon juice, for e.g. - use agave nectar, stevia sugar, or regular sugar rather than artificial sweateners - steam or broil/roast veggies  - snack on veggies/fruit/nuts (unsalted, preferably) when possible, rather than processed foods - reduce or eliminate aspartame in diet (it is in diet sodas, chewing gum, etc) Read the labels!  Try to read Dr. Janene Harvey book: "Program for Reversing Diabetes" for other ideas for healthy eating.

## 2014-01-31 NOTE — Telephone Encounter (Signed)
[facility] spoke with [facility] and advised her ok to switch to generic metformin/glucophage as long as it is extended release.

## 2014-01-31 NOTE — Telephone Encounter (Signed)
OK to call in generic metformin, but make sure it is extended release.

## 2014-01-31 NOTE — Telephone Encounter (Signed)
Tera from [facility] [phone]) called asking if they could use generic for the [place]? Please advise.

## 2014-01-31 NOTE — Progress Notes (Signed)
Patient ID: Ashley Gilmore, female   DOB: 10/30/1965, 49 y.o.   MRN: [id number]  HPI: Ashley Gilmore is a 49 y.o.-year-old female, referred by her PCP, Dr. Renold Genta, for management of DM2, dx in GDM in 1995 and 1997, then DM2 in ~2010, non-insulin-dependent, uncontrolled, without complications.  Last hemoglobin A1c was: Lab Results  Component Value Date   HGBA1C 9.3* 01/01/2014   HGBA1C 8.0* 07/09/2013   HGBA1C 7.1* 12/05/2012   Pt is on a regimen of: - Glipizide XL 10 mg at bedtime - Januvia 100 mg at bedtime She tried Metformin when prediabetic >> severe diarrhea.  Pt checks her sugars 0-1x a day and they are: - am: 170-314 - 2h after b'fast: n/c - before lunch: n/c - 2h after lunch: n/c - before dinner: n/c - 2h after dinner: n/c - bedtime: n/c - nighttime: n/c No lows. Lowest sugar was 70 (not recently); she has hypoglycemia awareness at 70.  Highest sugar was 314.  Glucometer:  [employer] Next  Pt's meals are: - Breakfast: protein bar - mid-am snack: apple + PB - Lunch: sandwich or salad - Dinner: meat + green veggie + starch - Snacks: 2 fruit, [name], rarely sweet tea  - no CKD, last BUN/creatinine:  Lab Results  Component Value Date   BUN 10 07/09/2013   CREATININE 0.74 07/09/2013  On Losartan - last set of lipids: Lab Results  Component Value Date   CHOL 172 01/01/2014   HDL 43 01/01/2014   LDLCALC 101* 01/01/2014   TRIG 142 01/01/2014   CHOLHDL 4.0 01/01/2014  On Zocor 10 >> stopped for 30 days >> restarted recently. - last eye exam was in 2 years ago. No DR.  - no numbness and tingling in her feet.  Pt has FH of DM in M, F, sister, MGM, MGF. Daughters have PCOS.  ROS: Constitutional: + weight gain, + fatigue, + hot flushes, + poor sleep Eyes: + blurry vision, no xerophthalmia ENT: no sore throat, no nodules palpated in throat, no dysphagia/odynophagia, no hoarseness Cardiovascular: no CP/SOB/palpitations/+ leg swelling Respiratory:  + all: cough/SOB/wheezing Gastrointestinal: no N/V/D/C/+ acid reflux Musculoskeletal: + muscle/+ joint aches Skin: + rash, + excessive hair growth Neurological: no tremors/numbness/tingling/dizziness, + HA Psychiatric: + depression/+ anxiety + low libido  Past Medical History  Diagnosis Date  . Hyperlipidemia   . Insomnia   . Migraine   . Diabetes mellitus   . Obesity    Past Surgical History  Procedure Laterality Date  . Tubal ligation     History   Social History  . Marital Status: married    Spouse Name: N/A    Number of Children: 2   Occupational History  . Music therapist   Social History Main Topics  . Smoking status: Current Every Day Smoker -- 0.5 packs/day    Types: Cigarettes  . Smokeless tobacco: Never Used  . Alcohol Use: No  . Drug Use: No   Current Outpatient Prescriptions on File Prior to Visit  Medication Sig Dispense Refill  . ALPRAZolam (XANAX) 0.25 MG tablet TAKE 1/2 TO 1 TABLET BY MOUTH DAILY AS NEEDED 30 tablet 5  . buPROPion (WELLBUTRIN XL) 150 MG 24 hr tablet TAKE 1 TABLET BY MOUTH ONCE DAILY 90 tablet 1  . cetirizine (ZYRTEC) 10 MG tablet Take 10 mg by mouth daily.      . cholecalciferol (VITAMIN D) 1000 UNITS tablet Take 2,000 Units by mouth daily.     Marland Kitchen  glipiZIDE (GLUCOTROL XL) 10 MG 24 hr tablet TAKE 1 TABLET BY MOUTH ONCE DAILY 90 tablet 1  . glucose blood test strip Use as instructed 100 each 12  . glucose monitoring kit (FREESTYLE) monitoring kit 1 each by Does not apply route QID. 1 each 0  . ibuprofen (ADVIL,MOTRIN) 200 MG tablet Take 600 mg by mouth 3 (three) times daily as needed. When she forgets to take naproxen    . JANUVIA 100 MG tablet TAKE 1 TABLET BY MOUTH DAILY. 90 tablet 3  . Lancets MISC 1 application by Does not apply route QID. 120 each 11  . losartan (COZAAR) 50 MG tablet Take 1 tablet (50 mg total) by mouth daily. 90 tablet 0  . pantoprazole (PROTONIX) 40 MG tablet TAKE 1 TABLET BY MOUTH ONCE DAILY 90 tablet 3  .  simvastatin (ZOCOR) 10 MG tablet TAKE 1 TABLET BY MOUTH AT BEDTIME 90 tablet 3  . VENTOLIN HFA 108 (90 BASE) MCG/ACT inhaler USE 2 PUFFS BY MOUTH 4 TIMES DAILY 54 g 12   No current facility-administered medications on file prior to visit.   Allergies  Allergen Reactions  . Metformin And Related Diarrhea  . Penicillins Rash   Family History  Problem Relation Age of Onset  . Diabetes Mother   . Hypertension Mother   . Diabetes Father   . Hypertension Father   . Hypertension Sister    PE: BP 126/72 mmHg  Pulse 106  Temp(Src) 97.4 F (36.3 C) (Oral)  Resp 12  Ht $R'5\' 7"'al$  (1.702 m)  Wt 269 lb 12.8 oz (122.38 kg)  BMI 42.25 kg/m2  SpO2 95%  LMP 01/03/2014 Wt Readings from Last 3 Encounters:  01/31/14 269 lb 12.8 oz (122.38 kg)  01/03/14 261 lb (118.389 kg)  11/28/13 273 lb (123.832 kg)   Constitutional: overweight, in NAD Eyes: PERRLA, EOMI, no exophthalmos ENT: moist mucous membranes, no thyromegaly, no cervical lymphadenopathy Cardiovascular: RRR, No MRG Respiratory: CTA B Gastrointestinal: abdomen soft, NT, ND, BS+ Musculoskeletal: no deformities, strength intact in all 4 Skin: moist, warm, no rashes Neurological: no tremor with outstretched hands, DTR normal in all 4  ASSESSMENT: 1. DM2, non-insulin-dependent, uncontrolled, without complications  PLAN:  1. Patient with long-standing, uncontrolled diabetes, on oral antidiabetic regimen, which became insufficient - We discussed about options for treatment, and I suggested to add Metformin XR. If she could not tolerate this, we can add Invokana or switch to an injectable GLP1 R agonist. We will also move her Glipizide and Januvia to am - now taken at bedtime!:  Patient Instructions  Please continue the following meds, but move them to before breakfast: - Glipizide XL 10 mg  - Januvia 100 mg   Please start Metformin XR 500 mg with dinner x 4 days. If you tolerate this well, add another Metformin tablet (500 mg) with  breakfast x 4 days. If you tolerate this well, add another metformin tablet with dinner (total 1000 mg) x 4 days. If you tolerate this well, add another metformin tablet with breakfast (total 1000 mg). Continue with 1000 mg of metformin 2x a day with breakfast and dinner.  Please return in 1 month with your sugar log.   - Strongly advised her to start checking sugars at different times of the day - check 2 times a day, rotating checks - given sugar log and advised how to fill it and to bring it at next appt  - given foot care handout and explained the principles  -  given instructions for hypoglycemia management "15-15 rule"  - advised for yearly eye exams >> needs one - Return to clinic in 1 mo with sugar log

## 2014-02-12 ENCOUNTER — Other Ambulatory Visit: Payer: Self-pay | Admitting: Internal Medicine

## 2014-03-11 ENCOUNTER — Encounter: Payer: Self-pay | Admitting: Internal Medicine

## 2014-03-11 ENCOUNTER — Ambulatory Visit (INDEPENDENT_AMBULATORY_CARE_PROVIDER_SITE_OTHER): Payer: Commercial Managed Care - PPO | Admitting: Internal Medicine

## 2014-03-11 VITALS — BP 128/72 | HR 106 | Temp 97.9°F | Resp 14 | Wt 268.8 lb

## 2014-03-11 DIAGNOSIS — IMO0001 Reserved for inherently not codable concepts without codable children: Secondary | ICD-10-CM

## 2014-03-11 DIAGNOSIS — E1165 Type 2 diabetes mellitus with hyperglycemia: Secondary | ICD-10-CM

## 2014-03-11 MED ORDER — CANAGLIFLOZIN 100 MG PO TABS: 100.0000 mg | ORAL_TABLET | Freq: Every day | ORAL | Status: DC

## 2014-03-11 MED ORDER — CVS PROBIOTIC PO CAPS: ORAL_CAPSULE | ORAL | Status: DC

## 2014-03-11 NOTE — Patient Instructions (Signed)
Patient Instructions  Please continue the following meds, but move them to before breakfast: - Glipizide XL 10 mg in am  - Januvia 100 mg in am  Please add Invokana 100 mg daily in am  Please return in 1.5 month with your sugar log.

## 2014-03-11 NOTE — Progress Notes (Signed)
Patient ID: Ashley Gilmore, female   DOB: Jun 03, 1965, 49 y.o.   MRN: [id number]  HPI: Ashley Gilmore is a 49 y.o.-year-old female, returning for f/u for DM2, dx in GDM in 1995 and 1997, then DM2 in ~2010, non-insulin-dependent, uncontrolled, without complications. Last visit 1.5 mo.  Last hemoglobin A1c was: Lab Results  Component Value Date   HGBA1C 9.3* 01/01/2014   HGBA1C 8.0* 07/09/2013   HGBA1C 7.1* 12/05/2012   Pt is on a regimen of: - Glipizide XL 10 mg at bedtime - Januvia 100 mg at bedtime - Metformin XR 1000 mg bid - started 01/2014 She tried Metformin when prediabetic >> severe diarrhea.  Pt checks her sugars 0-1x a day and they are: - am: 170-314 >> 155, 197-263 - 2h after b'fast: n/c  - before lunch: n/c >> 174-239 - 2h after lunch: n/c - before dinner: n/c >> 124, 165-233, 284 (snack) - 2h after dinner: n/c  - bedtime: n/c 153-270 - nighttime: n/c No lows. Lowest sugar was 70 (not recently); she has hypoglycemia awareness at 70.  Highest sugar was 273.  Glucometer:  [employer] Next  Pt's meals are: - Breakfast: protein bar - mid-am snack: apple + PB - Lunch: sandwich or salad - Dinner: meat + green veggie + starch - Snacks: 2 fruit, [name], rarely sweet tea  - no CKD, last BUN/creatinine:  Lab Results  Component Value Date   BUN 10 07/09/2013   CREATININE 0.74 07/09/2013  On Losartan - last set of lipids: Lab Results  Component Value Date   CHOL 172 01/01/2014   HDL 43 01/01/2014   LDLCALC 101* 01/01/2014   TRIG 142 01/01/2014   CHOLHDL 4.0 01/01/2014  On Zocor 10. - last eye exam was in 2 years ago. No DR.  - no numbness and tingling in her feet.  ROS: Constitutional: no weight gain, + fatigue, no hot flushes, + poor sleep Eyes: no blurry vision, no xerophthalmia ENT: no sore throat, no nodules palpated in throat, no dysphagia/odynophagia, no hoarseness Cardiovascular: no CP/SOB/palpitations/+ leg swelling Respiratory: + all:  cough/SOB/wheezing Gastrointestinal: no N/V/D/C/ acid reflux Musculoskeletal: + muscle/+ joint aches Skin: no rashes Neurological: no tremors/numbness/tingling/dizziness, no HA  I reviewed pt's medications, allergies, [name], social hx, family hx, [name], unchanged from my initial visit note.  Past Medical History  Diagnosis Date  . Hyperlipidemia   . Insomnia   . Migraine   . Diabetes mellitus   . Obesity    Past Surgical History  Procedure Laterality Date  . Tubal ligation     History   Social History  . Marital Status: married    Spouse Name: N/A    Number of Children: 2   Occupational History  . Music therapist   Social History Main Topics  . Smoking status: Current Every Day Smoker -- 0.5 packs/day    Types: Cigarettes  . Smokeless tobacco: Never Used  . Alcohol Use: No  . Drug Use: No   Current Outpatient Prescriptions on File Prior to Visit  Medication Sig Dispense Refill  . ALPRAZolam (XANAX) 0.25 MG tablet TAKE 1/2 TO 1 TABLET BY MOUTH DAILY AS NEEDED 30 tablet 5  . buPROPion (WELLBUTRIN XL) 150 MG 24 hr tablet TAKE 1 TABLET BY MOUTH ONCE DAILY 90 tablet 1  . cetirizine (ZYRTEC) 10 MG tablet Take 10 mg by mouth daily.      . cholecalciferol (VITAMIN  D) 1000 UNITS tablet Take 2,000 Units by mouth daily.     Marland Kitchen glipiZIDE (GLUCOTROL XL) 10 MG 24 hr tablet TAKE 1 TABLET BY MOUTH ONCE DAILY 90 tablet 1  . GLUCOPHAGE XR 500 MG 24 hr tablet Take 2 tablets (1,000 mg total) by mouth 2 (two) times daily with a meal. (Patient taking differently: Take 1,000 mg by mouth daily. 500 mg at night.) 120 tablet 2  . glucose blood test strip Use as instructed 100 each 12  . glucose monitoring kit (FREESTYLE) monitoring kit 1 each by Does not apply route QID. 1 each 0  . ibuprofen (ADVIL,MOTRIN) 200 MG tablet Take 600 mg by mouth 3 (three) times daily as needed. When she forgets to take naproxen    . JANUVIA 100 MG  tablet TAKE 1 TABLET BY MOUTH DAILY. 90 tablet 3  . Lancets MISC 1 application by Does not apply route QID. 120 each 11  . losartan (COZAAR) 50 MG tablet Take 1 tablet (50 mg total) by mouth daily. 90 tablet 0  . pantoprazole (PROTONIX) 40 MG tablet TAKE 1 TABLET BY MOUTH ONCE DAILY 90 tablet 3  . simvastatin (ZOCOR) 10 MG tablet TAKE 1 TABLET BY MOUTH AT BEDTIME 90 tablet 3  . VENTOLIN HFA 108 (90 BASE) MCG/ACT inhaler USE 2 PUFFS BY MOUTH 4 TIMES DAILY 54 g 12   No current facility-administered medications on file prior to visit.   Allergies  Allergen Reactions  . Metformin And Related Diarrhea  . Penicillins Rash   Family History  Problem Relation Age of Onset  . Diabetes Mother   . Hypertension Mother   . Diabetes Father   . Hypertension Father   . Hypertension Sister    PE: BP 128/72 mmHg  Pulse 106  Temp(Src) 97.9 F (36.6 C) (Oral)  Resp 14  Wt 268 lb 12.8 oz (121.927 kg)  SpO2 95% Wt Readings from Last 3 Encounters:  03/11/14 268 lb 12.8 oz (121.927 kg)  01/31/14 269 lb 12.8 oz (122.38 kg)  01/03/14 261 lb (118.389 kg)   Constitutional: overweight, in NAD Eyes: PERRLA, EOMI, no exophthalmos ENT: moist mucous membranes, no thyromegaly, no cervical lymphadenopathy Cardiovascular: RRR, No MRG Respiratory: CTA B Gastrointestinal: abdomen soft, NT, ND, BS+ Musculoskeletal: no deformities, strength intact in all 4 Skin: moist, warm, no rashes Neurological: no tremor with outstretched hands, DTR normal in all 4  ASSESSMENT: 1. DM2, non-insulin-dependent, uncontrolled, without complications  PLAN:  1. Patient with long-standing, uncontrolled diabetes, on oral antidiabetic regimen, which became insufficient - We discussed about options for treatment, and I suggested to add Invokana. I am not sure if we can gain DM control w/o insulin, but would like to try. Patient Instructions   Patient Instructions  Please continue the following meds, but move them to before  breakfast: - Glipizide XL 10 mg in am  - Januvia 100 mg in am  Please add Invokana 100 mg daily in am  Please return in 1.5 month with your sugar log.   - continue checking sugars at different times of the day - check 2 times a day, rotating checks - advised for yearly eye exams >> needs one! - will check Hba1c and BMP at next visit. - Return to clinic in 1.5 mo with sugar log

## 2014-04-01 ENCOUNTER — Other Ambulatory Visit: Payer: Self-pay | Admitting: Internal Medicine

## 2014-04-01 NOTE — Telephone Encounter (Signed)
Refill each x 6 months 

## 2014-04-01 NOTE — Telephone Encounter (Signed)
Refills sent for cozaar and alprazolam

## 2014-04-17 ENCOUNTER — Encounter: Payer: Self-pay | Admitting: Internal Medicine

## 2014-04-17 NOTE — Progress Notes (Signed)
   Subjective:    Patient ID: Ashley Gilmore, female    DOB: October 22, 1965, 49 y.o.   MRN: [id number]  HPI  Six-month recheck on diabetes and hypertension. History of smoking. She was seen at [facility] to wellness clinic by registered pharmacist to recommended discontinuing Januvia and starting her on weekly Bydureon. They also recommended HCTZ for control of high blood pressure they recommended increasing lipid medication to 20 mg daily. Her lipid panel is normal on current regimen with the exception of an LDL of 101 which is good.  Hemoglobin A1c is 9.3% and previously was 8%. I really don't think she is following a strict diabetic diet. She certainly not exercising. I'm concerned about the significant increase.  Review of Systems     Objective:   Physical Exam  Skin warm and dry. Nodes none. Chest clear. Cardiac exam regular rate and rhythm. Extremities without edema.      Assessment & Plan:  Essential hypertension  Obesity  Type 2 diabetes-currently not well controlled  Metabolic syndrome  History of smoking  Hyperlipidemia  Plan: Referral to endocrinologist. She will be 54 in January and is due for screening colonoscopy. Recommended diabetic eye exam.

## 2014-04-17 NOTE — Patient Instructions (Signed)
Appointment with endocrinologist for further evaluation.

## 2014-04-30 ENCOUNTER — Ambulatory Visit: Payer: Commercial Managed Care - PPO | Admitting: Internal Medicine

## 2014-05-02 ENCOUNTER — Encounter: Payer: Self-pay | Admitting: Internal Medicine

## 2014-05-02 ENCOUNTER — Ambulatory Visit (INDEPENDENT_AMBULATORY_CARE_PROVIDER_SITE_OTHER): Payer: Commercial Managed Care - PPO | Admitting: Internal Medicine

## 2014-05-02 VITALS — BP 122/76 | HR 96 | Temp 98.2°F | Resp 12 | Wt 268.0 lb

## 2014-05-02 DIAGNOSIS — E1165 Type 2 diabetes mellitus with hyperglycemia: Secondary | ICD-10-CM

## 2014-05-02 DIAGNOSIS — IMO0001 Reserved for inherently not codable concepts without codable children: Secondary | ICD-10-CM

## 2014-05-02 NOTE — Patient Instructions (Addendum)
Please continue: - Glipizide XL 10 mg in am  - Januvia 100 mg in am - Invokana 100 mg daily in am - Metformin XR 1000 mg 2x a day  Keep up the great work!  Please return in 3 month with your sugar log.   Please stop at the lab.

## 2014-05-02 NOTE — Progress Notes (Signed)
Patient ID: Ashley Gilmore, female   DOB: 03/28/1965, 49 y.o.   MRN: [id number]  HPI: Ashley Gilmore is a 49 y.o.-year-old female, returning for f/u for DM2, dx in GDM in 1995 and 1997, then DM2 in ~2010, non-insulin-dependent, uncontrolled, without complications. Last visit 1.5 mo.  Last hemoglobin A1c was: 04/30/2014: HbA1c 7.5% Lab Results  Component Value Date   HGBA1C 9.3* 01/01/2014   HGBA1C 8.0* 07/09/2013   HGBA1C 7.1* 12/05/2012   Pt is on a regimen of: - Glipizide XL 10 mg in am - Januvia 100 mg in am - Metformin XR 1000 mg bid - started 01/2014 - Invokana 100 mg daily in am - started 02/2014 She tried Metformin when prediabetic >> severe diarrhea.  Pt checks her sugars 0-1x a day and they are: - am: 170-314 >> 155, 197-263 >> 127-155 - 2h after b'fast: n/c  - before lunch: n/c >> 174-239 >> 120, 121, 160 - 2h after lunch: n/c >> 157 - before dinner: n/c >> 124, 165-233, 284 (snack) >> 97-127 - 2h after dinner: n/c  - bedtime: n/c 153-270 >> 111, 134, 190 - nighttime: n/c No lows. Lowest sugar was 70 (not recently); she has hypoglycemia awareness at 70.  Highest sugar was 273 >> 190  Glucometer:  [employer]  Pt's meals are: - Breakfast: protein bar - mid-am snack: apple + PB - Lunch: sandwich or salad - Dinner: meat + green veggie + starch - Snacks: 2 fruit, [name], rarely sweet tea  - no CKD, last BUN/creatinine:  Lab Results  Component Value Date   BUN 10 07/09/2013   CREATININE 0.74 07/09/2013  On Losartan - last set of lipids: Lab Results  Component Value Date   CHOL 172 01/01/2014   HDL 43 01/01/2014   LDLCALC 101* 01/01/2014   TRIG 142 01/01/2014   CHOLHDL 4.0 01/01/2014  On Zocor 10. - last eye exam was in 2 years ago. No DR.  - no numbness and tingling in her feet.  ROS: Constitutional: no weight gain, + fatigue, + hot flushes, + poor sleep Eyes: no blurry vision, no xerophthalmia ENT: no sore throat, no nodules  palpated in throat, no dysphagia/odynophagia, no hoarseness Cardiovascular: no CP/SOB/palpitations/+ leg swelling Respiratory: no cough/SOB/wheezing Gastrointestinal: no N/V/+ D/+ C/no acid reflux Musculoskeletal: + muscle/+ joint aches Skin: no rashes Neurological: no tremors/numbness/tingling/dizziness, no HA  I reviewed pt's medications, allergies, [name], social hx, family hx, [name], unchanged from my initial visit note.  Past Medical History  Diagnosis Date  . Hyperlipidemia   . Insomnia   . Migraine   . Diabetes mellitus   . Obesity    Past Surgical History  Procedure Laterality Date  . Tubal ligation     History   Social History  . Marital Status: married    Spouse Name: N/A    Number of Children: 2   Occupational History  . Music therapist   Social History Main Topics  . Smoking status: Current Every Day Smoker -- 0.5 packs/day    Types: Cigarettes  . Smokeless tobacco: Never Used  . Alcohol Use: No  . Drug Use: No   Current Outpatient Prescriptions on File Prior to Visit  Medication Sig Dispense Refill  . ALPRAZolam (XANAX) 0.25 MG tablet TAKE 1/2 TO 1 TABLET BY MOUTH DAILY AS NEEDED 30 tablet 5  . buPROPion (WELLBUTRIN XL) 150 MG 24 hr tablet TAKE  1 TABLET BY MOUTH ONCE DAILY 90 tablet 1  . canagliflozin (INVOKANA) 100 MG TABS tablet Take 1 tablet (100 mg total) by mouth daily. 30 tablet 2  . cetirizine (ZYRTEC) 10 MG tablet Take 10 mg by mouth daily.      . cholecalciferol (VITAMIN D) 1000 UNITS tablet Take 2,000 Units by mouth daily.     Marland Kitchen glipiZIDE (GLUCOTROL XL) 10 MG 24 hr tablet TAKE 1 TABLET BY MOUTH ONCE DAILY 90 tablet 1  . GLUCOPHAGE XR 500 MG 24 hr tablet Take 2 tablets (1,000 mg total) by mouth 2 (two) times daily with a meal. (Patient taking differently: Take 1,000 mg by mouth daily. 500 mg at night.) 120 tablet 2  . glucose blood test strip Use as instructed 100 each 12  .  glucose monitoring kit (FREESTYLE) monitoring kit 1 each by Does not apply route QID. 1 each 0  . ibuprofen (ADVIL,MOTRIN) 200 MG tablet Take 600 mg by mouth 3 (three) times daily as needed. When she forgets to take naproxen    . JANUVIA 100 MG tablet TAKE 1 TABLET BY MOUTH DAILY. 90 tablet 3  . Lancets MISC 1 application by Does not apply route QID. 120 each 11  . losartan (COZAAR) 50 MG tablet TAKE 1 TABLET BY MOUTH ONCE DAILY 90 tablet 1  . pantoprazole (PROTONIX) 40 MG tablet TAKE 1 TABLET BY MOUTH ONCE DAILY 90 tablet 3  . Probiotic Product ([employer] PROBIOTIC) CAPS Take 1 daily 60 capsule 2  . simvastatin (ZOCOR) 10 MG tablet TAKE 1 TABLET BY MOUTH AT BEDTIME 90 tablet 3  . VENTOLIN HFA 108 (90 BASE) MCG/ACT inhaler USE 2 PUFFS BY MOUTH 4 TIMES DAILY 54 g 12   No current facility-administered medications on file prior to visit.   Allergies  Allergen Reactions  . Metformin And Related Diarrhea  . Penicillins Rash   Family History  Problem Relation Age of Onset  . Diabetes Mother   . Hypertension Mother   . Diabetes Father   . Hypertension Father   . Hypertension Sister    PE: BP 122/76 mmHg  Pulse 96  Temp(Src) 98.2 F (36.8 C) (Oral)  Resp 12  Wt 268 lb (121.564 kg)  SpO2 97% Wt Readings from Last 3 Encounters:  05/02/14 268 lb (121.564 kg)  03/11/14 268 lb 12.8 oz (121.927 kg)  01/31/14 269 lb 12.8 oz (122.38 kg)   Constitutional: overweight, in NAD Eyes: PERRLA, EOMI, no exophthalmos ENT: moist mucous membranes, no thyromegaly, no cervical lymphadenopathy Cardiovascular: RRR, No MRG, + pitting LE edema periankle Respiratory: CTA B Gastrointestinal: abdomen soft, NT, ND, BS+ Musculoskeletal: no deformities, strength intact in all 4 Skin: moist, warm, no rashes Neurological: no tremor with outstretched hands, DTR normal in all 4  ASSESSMENT: 1. DM2, non-insulin-dependent, uncontrolled, without complications  PLAN:  1. Patient with long-standing, uncontrolled  diabetes, on oral antidiabetic regimen, now with impressive improvement in sugars after adding Invokana. - We discussed about options for treatment, and I suggested to continue current regimen:  Patient Instructions  Please continue: - Glipizide XL 10 mg in am  - Januvia 100 mg in am - Invokana 100 mg daily in am - Metformin XR 1000 mg 2x a day  Keep up the great work!  Please return in 3 month with your sugar log.   Please stop at the lab.   - will check a BMP since on Invokana - reviewed her last HbA1c together >> 7.5%! (decreased) - continue checking  sugars at different times of the day - check 2 times a day, rotating checks - advised for yearly eye exams >> needs one! - Return to clinic in 3 mo with sugar log    Office Visit on 05/02/2014  Component Date Value Ref Range Status  . Sodium 05/02/2014 139  135 - 145 mEq/L Final  . Potassium 05/02/2014 4.5  3.5 - 5.3 mEq/L Final  . Chloride 05/02/2014 103  96 - 112 mEq/L Final  . CO2 05/02/2014 28  19 - 32 mEq/L Final  . Glucose, Bld 05/02/2014 93  70 - 99 mg/dL Final  . BUN 05/02/2014 13  6 - 23 mg/dL Final  . Creat 05/02/2014 0.70  0.50 - 1.10 mg/dL Final  . Calcium 05/02/2014 8.6  8.4 - 10.5 mg/dL Final  . GFR, Est African American 05/02/2014 >89   Final  . GFR, Est Non African American 05/02/2014 >89   Final   Comment:   The estimated GFR is a calculation valid for adults (>=4 years old) that uses the CKD-EPI algorithm to adjust for age and sex. It is   not to be used for children, pregnant women, hospitalized patients,    patients on dialysis, or with rapidly changing kidney function. According to the NKDEP, eGFR >89 is normal, 60-89 shows mild impairment, 30-59 shows moderate impairment, 15-29 shows severe impairment and <15 is ESRD.      Potassium and GFR normal.

## 2014-05-03 LAB — BASIC METABOLIC PANEL WITH GFR
BUN: 13 mg/dL (ref 6–23)
CO2: 28 mEq/L (ref 19–32)
Calcium: 8.6 mg/dL (ref 8.4–10.5)
Chloride: 103 mEq/L (ref 96–112)
Creat: 0.7 mg/dL (ref 0.50–1.10)
GFR, Est African American: >89 mL/min
GFR, Est Non African American: >89 mL/min
Glucose, Bld: 93 mg/dL (ref 70–99)
Potassium: 4.5 mEq/L (ref 3.5–5.3)
Sodium: 139 mEq/L (ref 135–145)

## 2014-05-08 ENCOUNTER — Other Ambulatory Visit: Payer: Self-pay | Admitting: Internal Medicine

## 2014-06-05 ENCOUNTER — Other Ambulatory Visit: Payer: Self-pay | Admitting: Internal Medicine

## 2014-07-01 ENCOUNTER — Other Ambulatory Visit: Payer: Self-pay | Admitting: Internal Medicine

## 2014-07-18 ENCOUNTER — Ambulatory Visit: Payer: Self-pay

## 2014-07-18 ENCOUNTER — Ambulatory Visit (INDEPENDENT_AMBULATORY_CARE_PROVIDER_SITE_OTHER): Payer: Self-pay | Admitting: Family Medicine

## 2014-07-18 VITALS — BP 131/74 | HR 84 | Wt 272.0 lb

## 2014-07-18 DIAGNOSIS — E1165 Type 2 diabetes mellitus with hyperglycemia: Secondary | ICD-10-CM

## 2014-07-18 NOTE — Progress Notes (Signed)
Patient presents for 3 mo f/u DM as part of the employee sponsored Link to [employer]. Medications have been reviewed. I have also discussed with patient lifestyle interventions such as diet and exericise.   Diabetes- Point of Care A1C 7.6, above goal. I suggest increasing invokana to 300 mg and swapping Januvia for once weekly bydureon ($0 copay on our insurance). Will message Dr. Renne Crigler with suggestions. Hypertension-at goal. No recommended changes Hyperlipidemia-would suggest increasing simvastatin to moderate intensity 20 mg.  Patient has set a series of personal goals and will f/u in 3 mo for further review of DM

## 2014-07-18 NOTE — Patient Instructions (Signed)
Return in 3 mo for further evaluation. Work on missed doses of medications. Contact me with changes your endocrinologist makes.

## 2014-07-23 NOTE — Progress Notes (Signed)
ATTENDING PHYSICIAN NOTE: I have reviewed the chart and agree with the plan as detailed above. Sara Neal MD Pager [phone]  

## 2014-07-29 ENCOUNTER — Other Ambulatory Visit: Payer: Self-pay | Admitting: Internal Medicine

## 2014-08-01 ENCOUNTER — Ambulatory Visit: Payer: Commercial Managed Care - PPO | Admitting: Internal Medicine

## 2014-08-05 ENCOUNTER — Other Ambulatory Visit: Payer: Self-pay | Admitting: Internal Medicine

## 2014-08-28 ENCOUNTER — Telehealth: Payer: Self-pay | Admitting: Internal Medicine

## 2014-08-28 MED ORDER — METFORMIN HCL ER 500 MG PO TB24: ORAL_TABLET | ORAL | Status: DC

## 2014-08-28 NOTE — Telephone Encounter (Signed)
Patient need a refill of her metFORMIN (GLUCOPHAGE-XR) 500 MG 24 hr tablet send to [place], [place] [facility] [phone] (Phone) [phone] (Fax)

## 2014-08-28 NOTE — Telephone Encounter (Signed)
Done

## 2014-09-09 ENCOUNTER — Ambulatory Visit: Payer: 59 | Admitting: Internal Medicine

## 2014-09-16 ENCOUNTER — Other Ambulatory Visit: Payer: Self-pay | Admitting: Internal Medicine

## 2014-10-14 ENCOUNTER — Other Ambulatory Visit (INDEPENDENT_AMBULATORY_CARE_PROVIDER_SITE_OTHER): Payer: 59 | Admitting: *Deleted

## 2014-10-14 ENCOUNTER — Encounter: Payer: Self-pay | Admitting: Internal Medicine

## 2014-10-14 ENCOUNTER — Ambulatory Visit (INDEPENDENT_AMBULATORY_CARE_PROVIDER_SITE_OTHER): Payer: 59 | Admitting: Internal Medicine

## 2014-10-14 VITALS — BP 124/68 | HR 84 | Temp 98.3°F | Resp 12 | Wt 265.0 lb

## 2014-10-14 DIAGNOSIS — E1165 Type 2 diabetes mellitus with hyperglycemia: Secondary | ICD-10-CM

## 2014-10-14 DIAGNOSIS — IMO0001 Reserved for inherently not codable concepts without codable children: Secondary | ICD-10-CM

## 2014-10-14 LAB — POCT GLYCOSYLATED HEMOGLOBIN (HGB A1C): Hemoglobin A1C: 7.9

## 2014-10-14 NOTE — Progress Notes (Signed)
Patient ID: Ashley Gilmore, female   DOB: 1965-12-21, 49 y.o.   MRN: [id number]  HPI: Ashley Gilmore is a 49 y.o.-year-old female, returning for f/u for DM2, dx in GDM in 1995 and 1997, then DM2 in ~2010, non-insulin-dependent, uncontrolled, without complications. Last visit 4.5 mo ago.  Last hemoglobin A1c was: 04/30/2014: HbA1c 7.5% Lab Results  Component Value Date   HGBA1C 9.3* 01/01/2014   HGBA1C 8.0* 07/09/2013   HGBA1C 7.1* 12/05/2012   Pt is on a regimen of: - Glipizide XL 10 mg in am - Januvia 100 mg in am - Metformin XR 1000 mg bid - started 01/2014 - Invokana 100 mg daily in am - started 02/2014 She tried Metformin when prediabetic >> severe diarrhea.  Pt checks her sugars 0-1x a day and they are: - am: 170-314 >> 155, 197-263 >> 127-155 >> 134-220, 233 - 2h after b'fast: n/c >> 110 - before lunch: n/c >> 174-239 >> 120, 121, 160 >> 149 - 2h after lunch: n/c >> 157 >> n/c - before dinner: n/c >> 124, 165-233, 284 (snack) >> 97-127 >> 64-123 - 2h after dinner: n/c  - bedtime: n/c 153-270 >> 111, 134, 190 >> 94-130 - nighttime: n/c No lows. Lowest sugar was 70 (not recently); she has hypoglycemia awareness at 70.  Highest sugar was 273 >> 190 >> 233.  Glucometer:  [employer] Next  Pt's meals are: - Breakfast: protein bar - mid-am snack: apple + PB - Lunch: sandwich or salad - Dinner: meat + green veggie + starch - Snacks: 2 fruit, [name], rarely sweet tea  - no CKD, last BUN/creatinine:  Lab Results  Component Value Date   BUN 13 05/02/2014   CREATININE 0.70 05/02/2014  On Losartan - last set of lipids: Lab Results  Component Value Date   CHOL 172 01/01/2014   HDL 43 01/01/2014   LDLCALC 101* 01/01/2014   TRIG 142 01/01/2014   CHOLHDL 4.0 01/01/2014  On Zocor 10. - last eye exam was in 2014. No DR.  - no numbness and tingling in her feet.  ROS: Constitutional: no weight gain, + fatigue Eyes: no blurry vision, no xerophthalmia ENT:  no sore throat, no nodules palpated in throat, no dysphagia/odynophagia, no hoarseness Cardiovascular: no CP/SOB/palpitations/+ leg swelling Respiratory: no cough/SOB/wheezing Gastrointestinal: no N/V/D/C/no acid reflux Musculoskeletal: + muscle/+ joint aches Skin: no rashes Neurological: no tremors/numbness/tingling/dizziness, no HA  I reviewed pt's medications, allergies, [name], social hx, family hx, [name], unchanged from my initial visit note:  Past Medical History  Diagnosis Date  . Hyperlipidemia   . Insomnia   . Migraine   . Diabetes mellitus   . Obesity    Past Surgical History  Procedure Laterality Date  . Tubal ligation     History   Social History  . Marital Status: married    Spouse Name: N/A    Number of Children: 2   Occupational History  . Music therapist   Social History Main Topics  . Smoking status: Current Every Day Smoker -- 0.5 packs/day    Types: Cigarettes  . Smokeless tobacco: Never Used  . Alcohol Use: No  . Drug Use: No   Current Outpatient Prescriptions on File Prior to Visit  Medication Sig Dispense Refill  . ALPRAZolam (XANAX) 0.25 MG tablet TAKE 1/2 TO 1 TABLET BY MOUTH DAILY AS NEEDED 30 tablet 5  . buPROPion (WELLBUTRIN XL) 150  MG 24 hr tablet TAKE 1 TABLET BY MOUTH ONCE DAILY 90 tablet 1  . cetirizine (ZYRTEC) 10 MG tablet Take 10 mg by mouth daily.      . cholecalciferol (VITAMIN D) 1000 UNITS tablet Take 2,000 Units by mouth daily.     Marland Kitchen glipiZIDE (GLUCOTROL XL) 10 MG 24 hr tablet TAKE 1 TABLET BY MOUTH ONCE DAILY 90 tablet 1  . glucose blood test strip Use as instructed 100 each 12  . glucose monitoring kit (FREESTYLE) monitoring kit 1 each by Does not apply route QID. 1 each 0  . INVOKANA 100 MG TABS tablet TAKE 1 TABLET BY MOUTH ONCE DAILY 30 tablet 1  . JANUVIA 100 MG tablet TAKE 1 TABLET BY MOUTH DAILY. 90 tablet 3  . Lancets MISC 1 application by Does not apply  route QID. 120 each 11  . losartan (COZAAR) 50 MG tablet TAKE 1 TABLET BY MOUTH ONCE DAILY 90 tablet 1  . metFORMIN (GLUCOPHAGE-XR) 500 MG 24 hr tablet TAKE 2 TABLETS BY MOUTH TWICE DAILY WITH A MEAL 120 tablet 1  . pantoprazole (PROTONIX) 40 MG tablet TAKE 1 TABLET BY MOUTH ONCE DAILY 90 tablet 3  . Probiotic Product ([employer] PROBIOTIC) CAPS Take 1 daily 60 capsule 2  . simvastatin (ZOCOR) 10 MG tablet TAKE 1 TABLET BY MOUTH AT BEDTIME 90 tablet 3  . VENTOLIN HFA 108 (90 BASE) MCG/ACT inhaler USE 2 PUFFS BY MOUTH 4 TIMES DAILY 54 g 12   No current facility-administered medications on file prior to visit.   Allergies  Allergen Reactions  . Metformin And Related Diarrhea  . Penicillins Rash   Family History  Problem Relation Age of Onset  . Diabetes Mother   . Hypertension Mother   . Diabetes Father   . Hypertension Father   . Hypertension Sister    PE: BP 124/68 mmHg  Pulse 84  Temp(Src) 98.3 F (36.8 C) (Oral)  Resp 12  Wt 265 lb (120.203 kg)  SpO2 94% Wt Readings from Last 3 Encounters:  10/14/14 265 lb (120.203 kg)  07/18/14 272 lb (123.378 kg)  05/02/14 268 lb (121.564 kg)   Constitutional: overweight, in NAD Eyes: PERRLA, EOMI, no exophthalmos ENT: moist mucous membranes, no thyromegaly, no cervical lymphadenopathy Cardiovascular: RRR, No MRG, + pitting LE edema periankle Respiratory: CTA B Gastrointestinal: abdomen soft, NT, ND, BS+ Musculoskeletal: no deformities, strength intact in all 4 Skin: moist, warm, no rashes Neurological: no tremor with outstretched hands, DTR normal in all 4  ASSESSMENT: 1. DM2, non-insulin-dependent, uncontrolled, without complications  PLAN:  1. Patient with long-standing, uncontrolled diabetes, on oral antidiabetic regimen, with high sugars in am >> move all Metformin at night. She missed doses of her meds more since children started school and she has more stress since then >> she is ready to restart taking care of herself again. -  I suggested to:  Patient Instructions  Please continue: - Glipizide XL 10 mg in am  - Januvia 100 mg in am - Invokana 100 mg daily in am  Please move: - Metformin XR 500 mg x 4 tablets at dinnertime  Please return in 3 month with your sugar log.    - new HbA1c together >> 7.9% (higher) - continue checking sugars at different times of the day - check 2 times a day, rotating checks - advised for yearly eye exams >> needs one!!! - will get flu shot through work - Return to clinic in 3 mo with sugar log

## 2014-10-14 NOTE — Patient Instructions (Addendum)
Please continue: - Glipizide XL 10 mg in am  - Januvia 100 mg in am - Invokana 100 mg daily in am  Please move: - Metformin XR 500 mg x 4 tablets at dinnertime  Please return in 3 month with your sugar log.

## 2014-10-25 ENCOUNTER — Ambulatory Visit: Payer: 59

## 2014-10-25 NOTE — Progress Notes (Unsigned)
Subjective:  Patient presents today for 3 month diabetes follow-up as part of the employer-sponsored [facility] program.  Current diabetes regimen includes invokana, metformin ER, and glipizide.  Patient also continues on ARB and statin.  Most recent MD follow-up was with endocrinoligist last month. Patient has a pending appt for december.  Medication changes include all 4 metformin tablets have been moved to the afternoon.   Assessment:  Diabetes: Most recent A1C was   7.9% which is exceeding goal of <7%. Weight is stable.   Physical Activity- none consistenly. Just walks her dogs.   Follow up with me in 3 months.    Plan/Goals for Next Visit:  1. Provided patient with names of medications that are covered under our plan. Patient will discuss with dr. Renne Crigler about possibly increasing invokana to 300 mg and swapping [place] for bydureon.      Next appointment to see me is: January 2017

## 2014-11-14 ENCOUNTER — Other Ambulatory Visit: Payer: Self-pay | Admitting: Internal Medicine

## 2014-11-14 NOTE — Telephone Encounter (Signed)
Refill through December all 3 Rxs

## 2014-12-16 ENCOUNTER — Telehealth: Payer: Self-pay | Admitting: Internal Medicine

## 2014-12-16 MED ORDER — AZITHROMYCIN 250 MG PO TABS: ORAL_TABLET | ORAL | Status: DC

## 2014-12-16 NOTE — Telephone Encounter (Signed)
Came down with URI symptoms Thanksgiving day. Very congested. Husband with similar illness. Has appt here later this week. Call in Zithromax Zpak

## 2014-12-19 ENCOUNTER — Encounter: Payer: Self-pay | Admitting: Internal Medicine

## 2014-12-19 ENCOUNTER — Ambulatory Visit (INDEPENDENT_AMBULATORY_CARE_PROVIDER_SITE_OTHER): Payer: 59 | Admitting: Internal Medicine

## 2014-12-19 VITALS — BP 108/62 | HR 87 | Temp 97.7°F | Resp 20 | Ht 67.0 in | Wt 251.0 lb

## 2014-12-19 DIAGNOSIS — E785 Hyperlipidemia, unspecified: Secondary | ICD-10-CM

## 2014-12-19 DIAGNOSIS — E118 Type 2 diabetes mellitus with unspecified complications: Secondary | ICD-10-CM | POA: Diagnosis not present

## 2014-12-19 DIAGNOSIS — J309 Allergic rhinitis, unspecified: Secondary | ICD-10-CM | POA: Diagnosis not present

## 2014-12-19 DIAGNOSIS — Z87891 Personal history of nicotine dependence: Secondary | ICD-10-CM

## 2014-12-19 DIAGNOSIS — Z720 Tobacco use: Secondary | ICD-10-CM

## 2014-12-19 DIAGNOSIS — Z8659 Personal history of other mental and behavioral disorders: Secondary | ICD-10-CM | POA: Diagnosis not present

## 2014-12-19 DIAGNOSIS — E669 Obesity, unspecified: Secondary | ICD-10-CM

## 2014-12-19 DIAGNOSIS — I10 Essential (primary) hypertension: Secondary | ICD-10-CM

## 2014-12-19 DIAGNOSIS — Z0000 Encounter for general adult medical examination without abnormal findings: Secondary | ICD-10-CM

## 2014-12-19 DIAGNOSIS — E8881 Metabolic syndrome: Secondary | ICD-10-CM | POA: Diagnosis not present

## 2014-12-19 DIAGNOSIS — F419 Anxiety disorder, unspecified: Secondary | ICD-10-CM | POA: Diagnosis not present

## 2014-12-19 DIAGNOSIS — E1165 Type 2 diabetes mellitus with hyperglycemia: Secondary | ICD-10-CM | POA: Diagnosis not present

## 2014-12-19 DIAGNOSIS — Z8669 Personal history of other diseases of the nervous system and sense organs: Secondary | ICD-10-CM

## 2014-12-19 DIAGNOSIS — K219 Gastro-esophageal reflux disease without esophagitis: Secondary | ICD-10-CM | POA: Diagnosis not present

## 2014-12-19 DIAGNOSIS — IMO0002 Reserved for concepts with insufficient information to code with codable children: Secondary | ICD-10-CM

## 2014-12-19 LAB — CBC WITH DIFFERENTIAL/PLATELET
Basophils Absolute: 0.1 10*3/uL (ref 0.0–0.1)
Basophils Relative: 1 % (ref 0–1)
Eosinophils Absolute: 0.4 10*3/uL (ref 0.0–0.7)
Eosinophils Relative: 5 % (ref 0–5)
HCT: 50.8 % — ABNORMAL HIGH (ref 36.0–46.0)
Hemoglobin: 16.5 g/dL — ABNORMAL HIGH (ref 12.0–15.0)
Lymphocytes Relative: 25 % (ref 12–46)
Lymphs Abs: 1.8 10*3/uL (ref 0.7–4.0)
MCH: 28.1 pg (ref 26.0–34.0)
MCHC: 32.5 g/dL (ref 30.0–36.0)
MCV: 86.4 fL (ref 78.0–100.0)
MPV: 10.1 fL (ref 8.6–12.4)
Monocytes Absolute: 0.5 10*3/uL (ref 0.1–1.0)
Monocytes Relative: 7 % (ref 3–12)
Neutro Abs: 4.3 10*3/uL (ref 1.7–7.7)
Neutrophils Relative %: 62 % (ref 43–77)
Platelets: 289 10*3/uL (ref 150–400)
RBC: 5.88 MIL/uL — ABNORMAL HIGH (ref 3.87–5.11)
RDW: 14.1 % (ref 11.5–15.5)
WBC: 7 10*3/uL (ref 4.0–10.5)

## 2014-12-19 LAB — LIPID PANEL
Cholesterol: 144 mg/dL (ref 125–200)
HDL: 42 mg/dL — ABNORMAL LOW (ref >=46)
LDL Cholesterol: 77 mg/dL (ref <130)
Total CHOL/HDL Ratio: 3.4 Ratio (ref <=5.0)
Triglycerides: 126 mg/dL (ref <150)
VLDL: 25 mg/dL (ref <30)

## 2014-12-19 LAB — POCT URINALYSIS DIPSTICK
Bilirubin, UA: NEGATIVE
Glucose, UA: 1000
Leukocytes, UA: NEGATIVE
Nitrite, UA: NEGATIVE
Protein, UA: NEGATIVE
Spec Grav, UA: 1.01
Urobilinogen, UA: 0.2
pH, UA: 6

## 2014-12-19 LAB — COMPLETE METABOLIC PANEL WITH GFR
ALT: 28 U/L (ref 6–29)
AST: 21 U/L (ref 10–35)
Albumin: 3.8 g/dL (ref 3.6–5.1)
Alkaline Phosphatase: 108 U/L (ref 33–130)
BUN: 10 mg/dL (ref 7–25)
CO2: 29 mmol/L (ref 20–31)
Calcium: 8.8 mg/dL (ref 8.6–10.4)
Chloride: 99 mmol/L (ref 98–110)
Creat: 0.72 mg/dL (ref 0.50–1.05)
GFR, Est African American: >89 mL/min (ref >=60)
GFR, Est Non African American: >89 mL/min (ref >=60)
Glucose, Bld: 126 mg/dL — ABNORMAL HIGH (ref 65–99)
Potassium: 4.5 mmol/L (ref 3.5–5.3)
Sodium: 137 mmol/L (ref 135–146)
Total Bilirubin: 0.7 mg/dL (ref 0.2–1.2)
Total Protein: 6.9 g/dL (ref 6.1–8.1)

## 2014-12-20 LAB — TSH: TSH: 1.238 u[IU]/mL (ref 0.350–4.500)

## 2014-12-20 LAB — MICROALBUMIN, URINE: Microalb, Ur: 0.2 mg/dL

## 2014-12-20 LAB — VITAMIN D 25 HYDROXY (VIT D DEFICIENCY, FRACTURES): Vit D, 25-Hydroxy: 35 ng/mL (ref 30–100)

## 2014-12-20 LAB — HEMOGLOBIN A1C
Hgb A1c MFr Bld: 8 % — ABNORMAL HIGH (ref <5.7)
Mean Plasma Glucose: 183 mg/dL — ABNORMAL HIGH (ref <117)

## 2014-12-25 ENCOUNTER — Other Ambulatory Visit: Payer: Self-pay | Admitting: Internal Medicine

## 2015-01-21 MED FILL — ALPRAZolam 0.25 MG TABS: 0.25 | 30 days supply | Qty: 30 | Fill #1

## 2015-01-27 ENCOUNTER — Encounter: Payer: Self-pay | Admitting: Internal Medicine

## 2015-01-27 ENCOUNTER — Ambulatory Visit (INDEPENDENT_AMBULATORY_CARE_PROVIDER_SITE_OTHER): Payer: 59 | Admitting: Internal Medicine

## 2015-01-27 ENCOUNTER — Ambulatory Visit: Payer: 59 | Admitting: Internal Medicine

## 2015-01-27 VITALS — BP 118/64 | HR 113 | Temp 97.9°F | Resp 12 | Wt 258.6 lb

## 2015-01-27 DIAGNOSIS — IMO0001 Reserved for inherently not codable concepts without codable children: Secondary | ICD-10-CM

## 2015-01-27 DIAGNOSIS — E1165 Type 2 diabetes mellitus with hyperglycemia: Secondary | ICD-10-CM

## 2015-01-27 NOTE — Progress Notes (Signed)
Patient ID: Chandell Attridge Gable, female   DOB: 1965/06/12, 50 y.o.   MRN: [id number]  HPI: Ashley Gilmore is a 50 y.o.-year-old female, returning for f/u for DM2, dx in GDM in 1995 and 1997, then DM2 in ~2010, non-insulin-dependent, uncontrolled, without complications. Last visit 3 mo ago.  Last hemoglobin A1c was: Lab Results  Component Value Date   HGBA1C 8.0* 12/19/2014   HGBA1C 7.9 10/14/2014   HGBA1C 9.3* 01/01/2014  04/30/2014: HbA1c 7.5%  Pt is on a regimen of: - Glipizide XL 10 mg in am - Januvia 100 mg in am - Metformin XR 1000 mg with lunch and 1000 mg with dinner - started 01/2014 - Invokana 100 mg daily in am - started 02/2014 She tried regular Metformin when prediabetic >> severe diarrhea.  Pt checks her sugars 0-1x a day and they are: - am: 170-314 >> 155, 197-263 >> 127-155 >> 134-220, 233 >> 179-231 - 2h after b'fast: n/c >> 110 >> n/c - before lunch: n/c >> 174-239 >> 120, 121, 160 >> 149 >> 108-160 - 2h after lunch: n/c >> 157 >> n/c - before dinner: n/c >> 124, 165-233, 284 (snack) >> 97-127 >> 64-123 >> 72-120 - 2h after dinner: n/c  - bedtime: n/c 153-270 >> 111, 134, 190 >> 94-130 >> 101-200 - nighttime: n/c No lows. Lowest sugar was 70 (not recently); she has hypoglycemia awareness at 70.  Highest sugar was 273 >> 190 >> 233 >> 231.  Glucometer:  [employer] Next  Pt's meals are: - Breakfast: protein bar - mid-am snack: apple + PB - Lunch: sandwich or salad - Dinner: meat + green veggie + starch - Snacks: 2 fruit, [name], rarely sweet tea  - no CKD, last BUN/creatinine:  Lab Results  Component Value Date   BUN 10 12/19/2014   CREATININE 0.72 12/19/2014  On Losartan - last set of lipids: Lab Results  Component Value Date   CHOL 144 12/19/2014   HDL 42* 12/19/2014   LDLCALC 77 12/19/2014   TRIG 126 12/19/2014   CHOLHDL 3.4 12/19/2014  On Zocor 10. - last eye exam was in 02/2012. No DR.  - no numbness and tingling in her  feet.  ROS: Constitutional: no weight gain, no fatigue Eyes: no blurry vision, no xerophthalmia ENT: no sore throat, no nodules palpated in throat, no dysphagia/odynophagia, no hoarseness Cardiovascular: no CP/SOB/palpitations/leg swelling Respiratory: no cough/SOB/wheezing Gastrointestinal: no N/V/D/C/no acid reflux Musculoskeletal: no muscle/joint aches Skin: no rashes Neurological: no tremors/numbness/tingling/dizziness, no HA  I reviewed pt's medications, allergies, [name], social hx, family hx, [name], unchanged from my initial visit note:  Past Medical History  Diagnosis Date  . Hyperlipidemia   . Insomnia   . Migraine   . Diabetes mellitus   . Obesity    Past Surgical History  Procedure Laterality Date  . Tubal ligation     History   Social History  . Marital Status: married    Spouse Name: N/A    Number of Children: 2   Occupational History  . Music therapist   Social History Main Topics  . Smoking status: Current Every Day Smoker -- 0.5 packs/day    Types: Cigarettes  . Smokeless tobacco: Never Used  . Alcohol Use: No  . Drug Use: No   Current Outpatient Prescriptions on File Prior to Visit  Medication Sig Dispense Refill  . ALPRAZolam (XANAX) 0.25 MG tablet TAKE 1/2  TO 1 TABLET BY MOUTH DAILY AS NEEDED 30 tablet 2  . b complex vitamins tablet Take 1 tablet by mouth daily.    Marland Kitchen buPROPion (WELLBUTRIN XL) 150 MG 24 hr tablet TAKE 1 TABLET BY MOUTH ONCE DAILY 90 tablet 1  . cetirizine (ZYRTEC) 10 MG tablet Take 10 mg by mouth daily.      . cholecalciferol (VITAMIN D) 1000 UNITS tablet Take 1,000 Units by mouth daily.     Marland Kitchen glipiZIDE (GLUCOTROL XL) 10 MG 24 hr tablet TAKE 1 TABLET BY MOUTH ONCE DAILY 90 tablet 1  . glucose blood test strip Use as instructed 100 each 12  . glucose monitoring kit (FREESTYLE) monitoring kit 1 each by Does not apply route QID. 1 each 0  . INVOKANA 100 MG TABS  tablet TAKE 1 TABLET BY MOUTH ONCE DAILY 30 tablet 1  . JANUVIA 100 MG tablet TAKE 1 TABLET BY MOUTH DAILY. 90 tablet 3  . Lancets MISC 1 application by Does not apply route QID. 120 each 11  . losartan (COZAAR) 50 MG tablet TAKE 1 TABLET BY MOUTH ONCE DAILY 90 tablet 0  . metFORMIN (GLUCOPHAGE-XR) 500 MG 24 hr tablet TAKE 2 TABLETS BY MOUTH TWICE DAILY WITH A MEAL (Patient taking differently: 4 daily) 120 tablet 1  . pantoprazole (PROTONIX) 40 MG tablet TAKE 1 TABLET BY MOUTH ONCE DAILY 90 tablet 3  . Probiotic Product ([employer]S PROBIOTIC) CAPS Take 1 daily 60 capsule 2  . simvastatin (ZOCOR) 10 MG tablet TAKE 1 TABLET BY MOUTH AT BEDTIME 90 tablet 0  . VENTOLIN HFA 108 (90 BASE) MCG/ACT inhaler USE 2 PUFFS BY MOUTH 4 TIMES DAILY 54 g 12   No current facility-administered medications on file prior to visit.   Allergies  Allergen Reactions  . Metformin And Related Diarrhea  . Penicillins Rash   Family History  Problem Relation Age of Onset  . Diabetes Mother   . Hypertension Mother   . Diabetes Father   . Hypertension Father   . Hypertension Sister    PE: BP 118/64 mmHg  Pulse 113  Temp(Src) 97.9 F (36.6 C) (Oral)  Resp 12  Wt 258 lb 9.6 oz (117.3 kg)  SpO2 95%  LMP  Wt Readings from Last 3 Encounters:  01/27/15 258 lb 9.6 oz (117.3 kg)  12/19/14 251 lb (113.853 kg)  10/25/14 266 lb (120.657 kg)   Constitutional: overweight, in NAD Eyes: PERRLA, EOMI, no exophthalmos ENT: moist mucous membranes, no thyromegaly, no cervical lymphadenopathy Cardiovascular: RRR, No MRG, no LE edema Respiratory: CTA B Gastrointestinal: abdomen soft, NT, ND, BS+ Musculoskeletal: no deformities, strength intact in all 4 Skin: moist, warm, + stasis dermatitis rash B LE Neurological: no tremor with outstretched hands, DTR normal in all 4  ASSESSMENT: 1. DM2, non-insulin-dependent, uncontrolled, without complications  PLAN:  1. Patient with long-standing, uncontrolled diabetes, on oral  antidiabetic regimen, with high sugars in am. At last visit, we tried to move all Metformin at night, but this upset her stomach >> now taking it with lunch and dinner. The rest of the day, her sugars are mostly at or close to goal. Her last HbA1c is 8%, 1% above target. I am trying to avoid insulin so I asked her to try to avoid meat with dinner x 1 week to see if sugars in am improve. If not, we can try a GLP1 R agonist although I am not sure this will help too much with the am sugars... - I suggested  to:  Patient Instructions  Please continue: - Glipizide XL 10 mg in am  - Januvia 100 mg in am - Invokana 100 mg daily in am - Metformin XR 1000 mg 2x a day  Please return in 3 month with your sugar log.   Try 1 week without meat for dinner. Let me know about am sugars at the end of the week.   - continue checking sugars at different times of the day - check 2 times a day, rotating checks - advised for yearly eye exams >> needs one!!! - got flu shot through work - Return to clinic in 3 mo with sugar log

## 2015-01-27 NOTE — Patient Instructions (Signed)
Please continue: - Glipizide XL 10 mg in am  - Januvia 100 mg in am - Invokana 100 mg daily in am - Metformin XR 1000 mg 2x a day  Please return in 3 month with your sugar log.   Try 1 week without meat for dinner. Let me know about am sugars at the end of the week.

## 2015-03-13 ENCOUNTER — Other Ambulatory Visit: Payer: Self-pay | Admitting: Internal Medicine

## 2015-03-13 MED FILL — LOSARTAN POTASSIUM 50 MG TA: 50 | 90 days supply | Qty: 90 | Fill #0

## 2015-03-13 MED FILL — PANTOPRAZOLE SOD DR 40 MG T: 40 | 90 days supply | Qty: 90 | Fill #0

## 2015-03-13 MED FILL — glipiZIDE ER 10 MG TB24: 10 | 90 days supply | Qty: 90 | Fill #0

## 2015-03-13 MED FILL — SIMVASTATIN 10 MG TABLET: 10 | 90 days supply | Qty: 90 | Fill #0

## 2015-03-13 MED FILL — JANUVIA 100 MG TABLET: 100 | 90 days supply | Qty: 90 | Fill #0

## 2015-03-13 MED FILL — METFORMIN HCL ER 500 MG TAB: 500 | 60 days supply | Qty: 240 | Fill #0

## 2015-03-13 MED FILL — INVOKANA 100 MG TABLET: 100 | 30 days supply | Qty: 30 | Fill #0

## 2015-03-13 NOTE — Telephone Encounter (Signed)
Please refill x 6 months 

## 2015-03-14 ENCOUNTER — Telehealth: Payer: Self-pay

## 2015-03-16 ENCOUNTER — Encounter: Payer: Self-pay | Admitting: Internal Medicine

## 2015-03-16 DIAGNOSIS — E8881 Metabolic syndrome: Secondary | ICD-10-CM | POA: Insufficient documentation

## 2015-03-16 NOTE — Progress Notes (Signed)
   Subjective:    Patient ID: Ashley Gilmore, female    DOB: Sep 12, 1965, 50 y.o.   MRN: [id number]  HPI 50 year old White Female in today for health maintenance exam and evaluation of medical issues. History of controlled type 2 diabetes mellitus and obesity. History of GE reflux, insomnia, migraine headaches and asthma. She attends [facility] for glucose control. Past medical history: Oral surgery 1976 at which time wisdom teeth were extracted 4. Bilateral tubal ligation 2002. She's had 2 pregnancies and no miscarriages. 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 History of seasonal allergic rhinitis. She had gestational diabetes.  Doesn't have time to exercise.  She is allergic to penicillin-causes shortness of breath and a rash  Social history: She is married. 2 daughters both attending [employer]. She does not consume alcohol. Husband is a press man. She works as Clinical biochemist at [employer]. Completed 2 years of college.  Family history: Parents living. Father with history of hypertension and diabetes. Mother with history of hypertension and diabetes. Her mother is blind. One sister with history of hypertension.   Review of Systems  Constitutional: Negative.   All other systems reviewed and are negative.      Objective:   Physical Exam  Constitutional: She is oriented to person, place, and time. She appears well-developed and well-nourished. No distress.  HENT:  Head: Normocephalic and atraumatic.  Right Ear: External ear normal.  Left Ear: External ear normal.  Mouth/Throat: Oropharynx is clear and moist.  Eyes: Conjunctivae and EOM are normal. Pupils are equal, round, and reactive to light. Right eye exhibits no discharge. Left eye exhibits no discharge. No scleral icterus.  Neck: Neck supple. No JVD present. No thyromegaly present.  Cardiovascular: Normal rate, regular rhythm, normal heart sounds and intact distal  pulses.   No murmur heard. Pulmonary/Chest: Effort normal and breath sounds normal. No respiratory distress. She has no wheezes. She has no rales. She exhibits no tenderness.  Abdominal: Soft. Bowel sounds are normal. She exhibits no distension and no mass. There is no tenderness. There is no rebound and no guarding.  Genitourinary:  Pap done 2015. Bimanual normal.  Musculoskeletal: She exhibits no edema.  Lymphadenopathy:    She has no cervical adenopathy.  Neurological: She is alert and oriented to person, place, and time. She has normal reflexes. No cranial nerve deficit. Coordination normal.  Skin: Skin is warm and dry. No rash noted. She is not diaphoretic.  Psychiatric: She has a normal mood and affect. Her behavior is normal. Judgment and thought content normal.  Vitals reviewed.         Assessment & Plan:  Controlled type 2 diabetes mellitus-gets assistance through [facility]-encouraged diet exercise and weight loss. Currently on Januvia and metformin. Also followed by endocrinologist, Dr. Latanya Presser  Hyperlipidemia-on statin therapy  Obesity-encouraged diet exercise and weight loss  Anxiety depression treated with Xanax and Wellbutrin  History of smoking-once again discussed smoking cessation but patient doesn't seem ready to quit. Is on Wellbutrin for depression and help with smoking  History of migraine headaches  Allergic rhinitis-seasonal. Takes Zyrtec  GE reflux-treated with PPI  Plan: Once again advised to stop smoking. Continue same medications. Return in 6 months.

## 2015-03-16 NOTE — Patient Instructions (Signed)
Encouraged diet exercise and weight loss. Please stop smoking. Continue follow-up with endocrinologist. Return in 6 months. Continue same medications.

## 2015-04-08 MED FILL — ALPRAZolam 0.25 MG TABS: 0.25 | 30 days supply | Qty: 30 | Fill #2

## 2015-04-08 MED FILL — INVOKANA 100 MG TABLET: 100 | 30 days supply | Qty: 30 | Fill #1

## 2015-04-08 MED FILL — BUPROPION HCL XL 150 MG TAB: 150 | 90 days supply | Qty: 90 | Fill #1

## 2015-04-09 ENCOUNTER — Telehealth: Payer: Self-pay

## 2015-04-14 ENCOUNTER — Ambulatory Visit: Payer: 59 | Admitting: Pharmacist

## 2015-04-28 ENCOUNTER — Other Ambulatory Visit: Payer: Self-pay | Admitting: Pharmacist

## 2015-04-29 ENCOUNTER — Ambulatory Visit (INDEPENDENT_AMBULATORY_CARE_PROVIDER_SITE_OTHER): Payer: 59 | Admitting: Internal Medicine

## 2015-04-29 ENCOUNTER — Ambulatory Visit: Payer: 59 | Admitting: Internal Medicine

## 2015-04-29 ENCOUNTER — Other Ambulatory Visit (INDEPENDENT_AMBULATORY_CARE_PROVIDER_SITE_OTHER): Payer: 59 | Admitting: *Deleted

## 2015-04-29 ENCOUNTER — Other Ambulatory Visit: Payer: Self-pay | Admitting: *Deleted

## 2015-04-29 ENCOUNTER — Encounter: Payer: Self-pay | Admitting: Internal Medicine

## 2015-04-29 VITALS — BP 112/64 | HR 110 | Temp 98.1°F | Resp 12 | Wt 259.0 lb

## 2015-04-29 DIAGNOSIS — E1165 Type 2 diabetes mellitus with hyperglycemia: Secondary | ICD-10-CM | POA: Diagnosis not present

## 2015-04-29 DIAGNOSIS — IMO0001 Reserved for inherently not codable concepts without codable children: Secondary | ICD-10-CM

## 2015-04-29 LAB — POCT GLYCOSYLATED HEMOGLOBIN (HGB A1C): Hemoglobin A1C: 9.2

## 2015-04-29 MED ORDER — LIRAGLUTIDE 18 MG/3ML ~~LOC~~ SOPN: PEN_INJECTOR | SUBCUTANEOUS | Status: DC

## 2015-04-29 MED ORDER — BAYER CONTOUR NEXT MONITOR W/DEVICE KIT: PACK | Status: DC

## 2015-04-29 MED ORDER — GLUCOSE BLOOD VI STRP: ORAL_STRIP | Status: DC

## 2015-04-29 MED ORDER — BAYER MICROLET LANCETS MISC: Status: DC

## 2015-04-29 MED FILL — VICTOZA 18 MG/3 ML INJECT P: 18 | 30 days supply | Qty: 9 | Fill #0

## 2015-04-29 MED FILL — CONTOUR NEXT EZ METER: W/DEVICE | 1 days supply | Qty: 1 | Fill #0

## 2015-04-29 NOTE — Progress Notes (Signed)
Patient ID: Ashley Gilmore, female   DOB: 12/21/1965, 50 y.o.   MRN: [id number]  HPI: Ashley Gilmore is a 50 y.o.-year-old female, returning for f/u for DM2, dx in GDM in 1995 and 1997, then DM2 in ~2010, non-insulin-dependent, uncontrolled, without complications. Last visit 4 mo ago.  Last hemoglobin A1c was: Lab Results  Component Value Date   HGBA1C 8.0* 12/19/2014   HGBA1C 7.9 10/14/2014   HGBA1C 9.3* 01/01/2014  04/30/2014: HbA1c 7.5%  Pt is on a regimen of: - Glipizide XL 10 mg in am - Januvia 100 mg in am - Metformin XR 1000 mg with lunch and 1000 mg with dinner - started 01/2014 - Invokana 100 mg daily in am - started 02/2014 She tried regular Metformin when prediabetic >> severe diarrhea.  Pt checks her sugars 0-1x a day and they are - not in last month: - am: 170-314 >> 155, 197-263 >> 127-155 >> 134-220, 233 >> 179-231 >> 180-200 - 2h after b'fast: n/c >> 110 >> n/c - before lunch: n/c >> 174-239 >> 120, 121, 160 >> 149 >> 108-160 >> ? - 2h after lunch: n/c >> 157 >> n/c - before dinner: n/c >> 124, 165-233, 284 (snack) >> 97-127 >> 64-123 >> 72-120 >> ?  - 2h after dinner: n/c  - bedtime: n/c 153-270 >> 111, 134, 190 >> 94-130 >> 101-200 >> 200s - nighttime: n/c No lows. Lowest sugar was 70 (not recently); she has hypoglycemia awareness at 70.  Highest sugar was 273 >> 190 >> 233 >> 231 >> 200s.  Glucometer:  Bayer Contour Next  Pt's meals are: - Breakfast: protein bar - mid-am snack: apple + PB - Lunch: sandwich or salad - Dinner: meat + green veggie + starch - Snacks: 2 fruit, [name], rarely sweet tea  - no CKD, last BUN/creatinine:  Lab Results  Component Value Date   BUN 10 12/19/2014   CREATININE 0.72 12/19/2014  On Losartan - last set of lipids: Lab Results  Component Value Date   CHOL 144 12/19/2014   HDL 42* 12/19/2014   LDLCALC 77 12/19/2014   TRIG 126 12/19/2014   CHOLHDL 3.4 12/19/2014  On Zocor 10. - last eye exam was in  02/2012. No DR.  - no numbness and tingling in her feet.  ROS: Constitutional: no weight gain, + fatigue Eyes: no blurry vision, no xerophthalmia ENT: no sore throat, no nodules palpated in throat, no dysphagia/odynophagia, no hoarseness Cardiovascular: no CP/SOB/palpitations/leg swelling Respiratory: no cough/SOB/wheezing Gastrointestinal: no N/V/D/C/no acid reflux Musculoskeletal: + muscle/+ joint aches Skin: no rashes Neurological: no tremors/numbness/tingling/dizziness, no HA  I reviewed pt's medications, allergies, [name], social hx, family hx, [name], unchanged from my initial visit note:  Past Medical History  Diagnosis Date  . Hyperlipidemia   . Insomnia   . Obesity    Past Surgical History  Procedure Laterality Date  . Tubal ligation     History   Social History  . Marital Status: married    Spouse Name: N/A    Number of Children: 2   Occupational History  . Materials menager   Social History Main Topics  . Smoking status: Current Every Day Smoker -- 0.5 packs/day    Types: Cigarettes  . Smokeless tobacco: Never Used  . Alcohol Use: No  . Drug Use: No   Current Outpatient Prescriptions on File Prior to Visit  Medication Sig Dispense Refill  .   ALPRAZolam (XANAX) 0.25 MG tablet TAKE 1/2 TO 1 TABLET BY MOUTH DAILY AS NEEDED 30 tablet 2  . b complex vitamins tablet Take 1 tablet by mouth daily.    . buPROPion (WELLBUTRIN XL) 150 MG 24 hr tablet TAKE 1 TABLET BY MOUTH ONCE DAILY 90 tablet 1  . cetirizine (ZYRTEC) 10 MG tablet Take 10 mg by mouth daily.      . cholecalciferol (VITAMIN D) 1000 UNITS tablet Take 1,000 Units by mouth daily.     . glipiZIDE (GLUCOTROL XL) 10 MG 24 hr tablet TAKE 1 TABLET BY MOUTH ONCE DAILY 90 tablet 1  . glucose blood test strip Use as instructed 100 each 12  . glucose monitoring kit (FREESTYLE) monitoring kit 1 each by Does not apply route QID. 1 each 0  . INVOKANA  100 MG TABS tablet TAKE 1 TABLET BY MOUTH ONCE DAILY 30 tablet 1  . JANUVIA 100 MG tablet TAKE 1 TABLET BY MOUTH DAILY. 90 tablet 1  . Lancets MISC 1 application by Does not apply route QID. 120 each 11  . losartan (COZAAR) 50 MG tablet TAKE 1 TABLET BY MOUTH ONCE DAILY 90 tablet 1  . metFORMIN (GLUCOPHAGE-XR) 500 MG 24 hr tablet TAKE 2 TABLETS BY MOUTH TWICE DAILY WITH A MEAL 120 tablet 1  . Multiple Vitamins-Minerals (MULTIVITAMIN WITH MINERALS) tablet Take 1 tablet by mouth daily.    . pantoprazole (PROTONIX) 40 MG tablet TAKE 1 TABLET BY MOUTH ONCE DAILY 90 tablet 1  . Probiotic Product (CVS PROBIOTIC) CAPS Take 1 daily 60 capsule 2  . simvastatin (ZOCOR) 10 MG tablet TAKE 1 TABLET BY MOUTH AT BEDTIME 90 tablet 1  . VENTOLIN HFA 108 (90 BASE) MCG/ACT inhaler USE 2 PUFFS BY MOUTH 4 TIMES DAILY 54 g 12   No current facility-administered medications on file prior to visit.   Allergies  Allergen Reactions  . Metformin And Related Diarrhea  . Penicillins Rash   Family History  Problem Relation Age of Onset  . Diabetes Mother   . Hypertension Mother   . Diabetes Father   . Hypertension Father   . Hypertension Sister    PE: BP 112/64 mmHg  Pulse 110  Temp(Src) 98.1 F (36.7 C) (Oral)  Resp 12  Wt 259 lb (117.482 kg)  SpO2 94% Body mass index is 40.56 kg/(m^2).  Wt Readings from Last 3 Encounters:  04/29/15 259 lb (117.482 kg)  01/27/15 258 lb 9.6 oz (117.3 kg)  12/19/14 251 lb (113.853 kg)   Constitutional: overweight, in NAD Eyes: PERRLA, EOMI, no exophthalmos ENT: moist mucous membranes, no thyromegaly, no cervical lymphadenopathy Cardiovascular: RRR, No MRG, no LE edema Respiratory: CTA B Gastrointestinal: abdomen soft, NT, ND, BS+ Musculoskeletal: no deformities, strength intact in all 4 Skin: moist, warm, + stasis dermatitis rash B LE Neurological: no tremor with outstretched hands, DTR normal in all 4  ASSESSMENT: 1. DM2, non-insulin-dependent, uncontrolled,  without complications  PLAN:  1. Patient with long-standing, uncontrolled diabetes, on oral antidiabetic regimen, With still poor control. At last visits, we tried to move all Metformin at night, but this upset her stomach >> now taking it with lunch and dinner. Since she refuses insulin, we will try a GLP1 R agonist although I am not sure this will help too much with the am sugars... She definitely to also change her diet. She did try few days eating only vegetables for dinner, but she had to wake up in the middle of the night and eats   cereals because she was hungry. - I suggested to:  Patient Instructions  Please continue: - Glipizide XL 10 mg in am  - Invokana 100 mg daily in am - Metformin XR 1000 mg 2x a day  Please start: - Victoza 0.6 mg daily in am, before b'fast x 1 week, then increase to 1.2 mg daily in a.m. x1 week, then increase to 1.8 mg daily in a.m. and continue on this dose.  Please stop Januvia 1 week after starting Victoza.  Please return in 3 month with your sugar log.   - continue checking sugars at different times of the day - check 2 times a day, rotating checks - advised for yearly eye exams >> needs one!!! - got flu shot through work - Return to clinic in 3 mo with sugar log

## 2015-04-29 NOTE — Patient Instructions (Addendum)
Please continue: - Glipizide XL 10 mg in am  - Invokana 100 mg daily in am - Metformin XR 1000 mg 2x a day  Please start: - Victoza 0.6 mg daily in am, before b'fast x 1 week, then increase to 1.2 mg daily in a.m. x1 week, then increase to 1.8 mg daily in a.m. and continue on this dose.  Please stop Januvia 1 week after starting Victoza.  Please return in 3 month with your sugar log.

## 2015-04-30 ENCOUNTER — Telehealth: Payer: Self-pay | Admitting: Internal Medicine

## 2015-04-30 MED ORDER — INSULIN PEN NEEDLE 32G X 4 MM MISC: Status: DC

## 2015-04-30 MED FILL — UNIFINE PENTIPS 32GX5/32: 32G X 4 MM | 90 days supply | Qty: 100 | Fill #0

## 2015-04-30 NOTE — Telephone Encounter (Signed)
Patient stated that she need the needles for the Victoza send to [facility], [place] - [place] [phone] (Phone) [phone] (Fax)

## 2015-05-05 ENCOUNTER — Encounter: Payer: Self-pay | Admitting: Pharmacist

## 2015-05-05 MED FILL — MICROLET LANCETS: 50 days supply | Qty: 100 | Fill #0

## 2015-05-05 NOTE — Patient Outreach (Addendum)
[facility]) Care Management  [place]   05/05/2015  Perle Brickhouse Hermida [birth year] [id number]  Subjective: Mrs Gang is a 50 year old female reporting for her follow up employee plan Link to Wellness visit.  She reports that she has not checked her BG at home in 2-3 weeks due to her dog eating her meter and destroying it.  Prior to her BG meter failure, she reports only sporadically checking her BG.  She reports that she is unable to tolerate any dose of metformin > 1000 mg due to diarrhea. At her last endocrinology appointment she was instructed to avoid protein with her nightly meal but she was unable to adhere to this as she was waking up during the night eating snacks.  She does report more stress in her life lately with 2 children in college, her work stress, and her mother being sick.   Exercise: 20-30 minutes of walking daily while walking her dog.  Reports spending 50% of time at work on her feet/walking  Diet:  Breakfast: Granola/Protein bar Snack: Yogurt or nuts or almonds Lunch: Usually leftovers or soup Snack: Granola or protein shake (Glucerna with protein) or banana/orange Dinner: Meat + Vegetable + Starch (sweet or baked potato).  Reports baked or grilled chicken/venison/fish that is unbreaded   Objective:  12/19/14 A1C 8 10/14/14 A1C 7.9   Encounter Medications: Outpatient Encounter Prescriptions as of 04/28/2015  Medication Sig Note  . ALPRAZolam (XANAX) 0.25 MG tablet TAKE 1/2 TO 1 TABLET BY MOUTH DAILY AS NEEDED   . b complex vitamins tablet Take 1 tablet by mouth daily.   Marland Kitchen buPROPion (WELLBUTRIN XL) 150 MG 24 hr tablet TAKE 1 TABLET BY MOUTH ONCE DAILY   . cetirizine (ZYRTEC) 10 MG tablet Take 10 mg by mouth daily.     . cholecalciferol (VITAMIN D) 1000 UNITS tablet Take 1,000 Units by mouth daily.    Marland Kitchen glipiZIDE (GLUCOTROL XL) 10 MG 24 hr tablet TAKE 1 TABLET BY MOUTH ONCE DAILY   . INVOKANA 100 MG TABS tablet TAKE 1 TABLET BY MOUTH ONCE DAILY   .  losartan (COZAAR) 50 MG tablet TAKE 1 TABLET BY MOUTH ONCE DAILY   . metFORMIN (GLUCOPHAGE-XR) 500 MG 24 hr tablet TAKE 2 TABLETS BY MOUTH TWICE DAILY WITH A MEAL 04/28/2015: Takes two 500 mg daily at bedtime   . Multiple Vitamins-Minerals (MULTIVITAMIN WITH MINERALS) tablet Take 1 tablet by mouth daily.   . pantoprazole (PROTONIX) 40 MG tablet TAKE 1 TABLET BY MOUTH ONCE DAILY   . Probiotic Product (CVS PROBIOTIC) CAPS Take 1 daily   . simvastatin (ZOCOR) 10 MG tablet TAKE 1 TABLET BY MOUTH AT BEDTIME   . VENTOLIN HFA 108 (90 BASE) MCG/ACT inhaler USE 2 PUFFS BY MOUTH 4 TIMES DAILY   . [DISCONTINUED] glucose blood test strip Use as instructed   . [DISCONTINUED] JANUVIA 100 MG tablet TAKE 1 TABLET BY MOUTH DAILY.   . [DISCONTINUED] glucose monitoring kit (FREESTYLE) monitoring kit 1 each by Does not apply route QID.   . [DISCONTINUED] Lancets MISC 1 application by Does not apply route QID.    No facility-administered encounter medications on file as of 04/28/2015.    Functional Status: In your present state of health, do you have any difficulty performing the following activities: 05/05/2015 10/25/2014  Hearing? N N  Vision? N N  Difficulty concentrating or making decisions? N N  Walking or climbing stairs? N N  Dressing or bathing? N N  Doing  errands, shopping? N N    Fall/Depression Screening: PHQ 2/9 Scores 05/05/2015 07/18/2014  PHQ - 2 Score 0 3  PHQ- 9 Score - 6    Assessment:  Diabetes: Currently uncontrolled on Invokana 100 mg daily, Glipizide XL 10 mg daily, Januvia 100 mg daily, and Metformin 1000 mg daily. She is currently on a statin but not currently on ASA 81 mg. She reports adhering to a low carbohydrate diet most of the time and low intensity walking daily.   Plan:  1. Counseled pt on maintaining consistent exercise and low carbohydrate diet.  2. Discussed goals of reducing stress by having a 15 minute walk per day by herself and better A1C control by discussing a  GLP1 with her endocrinologist.    3.  She plans to ask her endocrinologist for a new meter tomorrow and we discussed being adherent to checking her BG daily.  3. Patient has set 2 personal goals and will follow up in 3 months via telephone and 6 months clinic visit for review of diabetes management   [facility] CM Care Plan Problem One        Most Recent Value   Care Plan Problem One  elevated A1C   Role Documenting the Problem One  Clinical Pharmacist   Care Plan for Problem One  Active   [facility] Long Term Goal (31-90 days)  patient will try to not miss morning doses of medications over the next 90 days   [facility] Long Term Goal Start Date  07/18/14   Interventions for Problem One Long Term Goal  discussed other treatment options including GLP1s    [facility] CM Care Plan Problem Two        Most Recent Value   Care Plan Problem Two  Elevated Stress    Role Documenting the Problem Two  Clinical Pharmacist   Care Plan for Problem Two  Active   Interventions for Problem Two Long Term Goal   Provided stress reduction and exercise counseling    [facility] Long Term Goal (31-90) days  Increase exercise with one 15 min walk per day by herself without animals or other people to reduce stress as measured by patient report   [place] Term Goal Start Date  04/28/15

## 2015-05-22 MED FILL — VICTOZA 18 MG/3 ML INJECT P: 18 | 30 days supply | Qty: 9 | Fill #1

## 2015-05-26 ENCOUNTER — Other Ambulatory Visit: Payer: Self-pay | Admitting: Internal Medicine

## 2015-05-26 MED FILL — INVOKANA 100 MG TABLET: 100 | 30 days supply | Qty: 30 | Fill #0

## 2015-06-09 ENCOUNTER — Other Ambulatory Visit: Payer: Self-pay | Admitting: Internal Medicine

## 2015-06-09 MED FILL — VENTOLIN HFA 90 MCG INHALER: 108 (90 BAS | 75 days supply | Qty: 54 | Fill #0

## 2015-06-09 MED FILL — LOSARTAN POTASSIUM 50 MG TA: 50 | 90 days supply | Qty: 90 | Fill #1

## 2015-06-09 MED FILL — PANTOPRAZOLE SOD DR 40 MG T: 40 | 90 days supply | Qty: 90 | Fill #1

## 2015-06-09 MED FILL — glipiZIDE XL 10 MG TB24: 10 | 90 days supply | Qty: 90 | Fill #1

## 2015-06-09 MED FILL — SIMVASTATIN 10 MG TABLET: 10 | 90 days supply | Qty: 90 | Fill #1

## 2015-06-23 ENCOUNTER — Other Ambulatory Visit: Payer: Self-pay | Admitting: Internal Medicine

## 2015-06-23 MED FILL — METFORMIN HCL ER 500 MG TAB: 500 | 30 days supply | Qty: 120 | Fill #0

## 2015-06-23 MED FILL — VICTOZA 18 MG/3 ML INJECT P: 18 | 30 days supply | Qty: 9 | Fill #2

## 2015-06-23 MED FILL — INVOKANA 100 MG TABLET: 100 | 30 days supply | Qty: 30 | Fill #1

## 2015-06-26 ENCOUNTER — Encounter: Payer: Self-pay | Admitting: Internal Medicine

## 2015-06-26 ENCOUNTER — Ambulatory Visit: Payer: 59 | Admitting: Internal Medicine

## 2015-06-26 ENCOUNTER — Ambulatory Visit (INDEPENDENT_AMBULATORY_CARE_PROVIDER_SITE_OTHER): Payer: 59 | Admitting: Internal Medicine

## 2015-06-26 VITALS — BP 118/64 | HR 99 | Temp 97.4°F | Resp 18 | Wt 252.5 lb

## 2015-06-26 DIAGNOSIS — K219 Gastro-esophageal reflux disease without esophagitis: Secondary | ICD-10-CM | POA: Diagnosis not present

## 2015-06-26 DIAGNOSIS — Z87891 Personal history of nicotine dependence: Secondary | ICD-10-CM

## 2015-06-26 DIAGNOSIS — I10 Essential (primary) hypertension: Secondary | ICD-10-CM

## 2015-06-26 DIAGNOSIS — E119 Type 2 diabetes mellitus without complications: Secondary | ICD-10-CM | POA: Diagnosis not present

## 2015-06-26 DIAGNOSIS — E669 Obesity, unspecified: Secondary | ICD-10-CM

## 2015-06-26 DIAGNOSIS — F32A Depression, unspecified: Secondary | ICD-10-CM

## 2015-06-26 DIAGNOSIS — F329 Major depressive disorder, single episode, unspecified: Secondary | ICD-10-CM

## 2015-06-26 DIAGNOSIS — Z720 Tobacco use: Secondary | ICD-10-CM

## 2015-06-26 DIAGNOSIS — E785 Hyperlipidemia, unspecified: Secondary | ICD-10-CM | POA: Diagnosis not present

## 2015-06-26 DIAGNOSIS — E8881 Metabolic syndrome: Secondary | ICD-10-CM

## 2015-07-17 MED FILL — METFORMIN HCL ER 500 MG TAB: 500 | 30 days supply | Qty: 120 | Fill #1

## 2015-07-17 MED FILL — VICTOZA 18 MG/3 ML INJECT P: 18 | 30 days supply | Qty: 9 | Fill #3

## 2015-07-17 MED FILL — INVOKANA 100 MG TABLET: 100 | 30 days supply | Qty: 30 | Fill #2

## 2015-07-18 NOTE — Progress Notes (Signed)
   Subjective:    Patient ID: Ashley Gilmore, female    DOB: [birth year], 50 y.o.   MRN: [id number]  HPI 50 year old Female with history of diabetes mellitus and essential hypertension for six-month follow-up. Fortunately has not been able to lose weight. In December was 251 pounds and now 252.5 pounds. Continues to smoke. In addition,  has hyperlipidemia, insomnia, GE reflux, metabolic syndrome. She is followed by Dr. Maryellen Pile, endocrinologist and has follow-up appointment in July. She also has follow-up appointment in late July with [facility] in pharmacist for support in managing diabetes. Currently maintained on Invega, metformin and glipizide as well as Victoza. Remains on Zoloft for depression. Blood pressure stable on current regimen. She is on statin medication.    Review of Systems as above     Objective:   Physical Exam  No JVD thyromegaly or carotid bruits. Chest clear to auscultation. Cardiac exam regular rate and rhythm normal S1 and S2. Extremities without edema.      Assessment & Plan:  Stable blood pressure control on current regimen  Type 2 diabetes mellitus-to have hemoglobin A1c when she sees endocrinologist next month. Does not want to have lab work today due to upcoming endocrinology appointment  Obesity  Hyperlipidemia  Depression  Metabolic syndrome  Plan: Continue to encourage her to diet exercise and lose weight. She has follow-up appointment here December for physical exam. Of course she was encouraged quit smoking. Reminded about diabetic eye exam.

## 2015-07-18 NOTE — Patient Instructions (Signed)
It was pleasure to see today. Follow-up with endocrinologist and with [facility] and pharmacist. Continue diet exercise and weight loss efforts. Please quit smoking. Return here to sever for physical exam. No lab work was done with this visit.

## 2015-07-28 MED FILL — UNIFINE PENTIPS 32GX5/32: 32G X 4 MM | 90 days supply | Qty: 100 | Fill #1

## 2015-07-29 ENCOUNTER — Encounter: Payer: Self-pay | Admitting: Internal Medicine

## 2015-07-29 ENCOUNTER — Ambulatory Visit (INDEPENDENT_AMBULATORY_CARE_PROVIDER_SITE_OTHER): Payer: 59 | Admitting: Internal Medicine

## 2015-07-29 VITALS — BP 124/74 | HR 95 | Ht 66.0 in | Wt 252.0 lb

## 2015-07-29 DIAGNOSIS — E1165 Type 2 diabetes mellitus with hyperglycemia: Secondary | ICD-10-CM | POA: Diagnosis not present

## 2015-07-29 DIAGNOSIS — IMO0001 Reserved for inherently not codable concepts without codable children: Secondary | ICD-10-CM

## 2015-07-29 LAB — POCT GLYCOSYLATED HEMOGLOBIN (HGB A1C): Hemoglobin A1C: 6.8

## 2015-07-29 MED ORDER — METFORMIN HCL ER 500 MG PO TB24: ORAL_TABLET | ORAL | Status: DC

## 2015-07-29 NOTE — Patient Instructions (Addendum)
Please continue: - Glipizide XL 10 mg in am  - Invokana 100 mg daily in am - Metformin XR 500 mg 2x a day - Victoza 1.8 mg daily in a.m.  Please return in 3 month with your sugar log.

## 2015-07-29 NOTE — Progress Notes (Signed)
Patient ID: Ashley Gilmore, female   DOB: [birth year], 50 y.o.   MRN: [id number]  HPI: Ashley Gilmore is a 50 y.o.-year-old female, returning for f/u for DM2, dx in GDM in 1995 and 1997, then DM2 in ~2010, non-insulin-dependent, uncontrolled, without complications. Last visit 3 mo ago.  She lost 7 lbs since last visit! (Victoza)  Last hemoglobin A1c was: Lab Results  Component Value Date   HGBA1C 9.2 04/29/2015   HGBA1C 8.0* 12/19/2014   HGBA1C 7.9 10/14/2014  04/30/2014: HbA1c 7.5%  Pt is on a regimen of: - Glipizide XL 10 mg in am - Metformin XR 500 mg with lunch and 500 mg with dinner - started 01/2014 (higher doses >> diarrhea) - Invokana 100 mg daily in am - started 02/2014 - Victoza 1.8 mg daily - started 04/2015 She tried regular Metformin when prediabetic >> severe diarrhea.  Pt checks her sugars 0-1x a day and they are: - am: 170-314 >> 155, 197-263 >> 127-155 >> 134-220, 233 >> 179-231 >> 180-200 >> 112-161, 191, 204 - 2h after b'fast: n/c >> 110 >> n/c - before lunch: n/c >> 174-239 >> 120, 121, 160 >> 149 >> 108-160 >> ? >> 78-97 - 2h after lunch: n/c >> 157 >> n/c >> 131 - before dinner: n/c >> 124, 165-233, 284 (snack) >> 97-127 >> 64-123 >> 72-120 >> ? >> 131-171, 239 - 2h after dinner: n/c >> 88, 89 - bedtime: n/c 153-270 >> 111, 134, 190 >> 94-130 >> 101-200 >> 200s >> 107-172 - nighttime: n/c No lows. Lowest sugar was 70 (not recently) >> 78; she has hypoglycemia awareness at 70.  Highest sugar was 273 >> 190 >> 233 >> 231 >> 200s >> 239.  Glucometer:  [employer] Next  Pt's meals are: - Breakfast: protein bar - mid-am snack: apple + PB - Lunch: sandwich or salad - Dinner: meat + green veggie + starch - Snacks: 2 fruit, [name], rarely sweet tea  - no CKD, last BUN/creatinine:  Lab Results  Component Value Date   BUN 10 12/19/2014   CREATININE 0.72 12/19/2014  On Losartan - last set of lipids: Lab Results  Component Value Date   CHOL  144 12/19/2014   HDL 42* 12/19/2014   LDLCALC 77 12/19/2014   TRIG 126 12/19/2014   CHOLHDL 3.4 12/19/2014  On Zocor 10. - last eye exam was in 05/2015. Reportedly no DR.  - no numbness and tingling in her feet.  ROS: Constitutional: no weight gain, + fatigue Eyes: no blurry vision, no xerophthalmia ENT: no sore throat, no nodules palpated in throat, no dysphagia/odynophagia, no hoarseness Cardiovascular: no CP/SOB/palpitations/leg swelling Respiratory: no cough/SOB/wheezing Gastrointestinal: no N/V/D/C/no acid reflux Musculoskeletal: + muscle/+ joint aches Skin: no rashes Neurological: no tremors/numbness/tingling/dizziness, no HA  I reviewed pt's medications, allergies, [name], social hx, family hx, [name], unchanged from my initial visit note:  Past Medical History  Diagnosis Date  . Hyperlipidemia   . Insomnia   . Obesity    Past Surgical History  Procedure Laterality Date  . Tubal ligation     History   Social History  . Marital Status: married    Spouse Name: N/A    Number of Children: 2   Occupational History  . Scientist, research (life sciences)   Social History Main Topics  . Smoking status: Current Every Day Smoker -- 0.5 packs/day    Types: Cigarettes  . Smokeless  tobacco: Never Used  . Alcohol Use: No  . Drug Use: No   Current Outpatient Prescriptions on File Prior to Visit  Medication Sig Dispense Refill  . ALPRAZolam (XANAX) 0.25 MG tablet TAKE 1/2 TO 1 TABLET BY MOUTH DAILY AS NEEDED 30 tablet 2  . b complex vitamins tablet Take 1 tablet by mouth daily.    Marland Kitchen BAYER MICROLET LANCETS lancets Use to test blood sugar 2 times daily as instructed. Dx: E11.65 100 each 5  . Blood Glucose Monitoring Suppl (BAYER CONTOUR NEXT MONITOR) w/Device KIT Use to test blood sugar daily. 1 kit 0  . buPROPion (WELLBUTRIN XL) 150 MG 24 hr tablet TAKE 1 TABLET BY MOUTH ONCE DAILY 90 tablet 1  . cetirizine (ZYRTEC) 10 MG  tablet Take 10 mg by mouth daily.      . cholecalciferol (VITAMIN D) 1000 UNITS tablet Take 1,000 Units by mouth daily.     Marland Kitchen glipiZIDE (GLUCOTROL XL) 10 MG 24 hr tablet TAKE 1 TABLET BY MOUTH ONCE DAILY 90 tablet 1  . glucose blood (BAYER CONTOUR NEXT TEST) test strip Use to test blood sugar 2 times daily as instructed. Dx: E11.65 100 each 5  . Insulin Pen Needle 32G X 4 MM MISC Use 1 daily. 90 each 3  . INVOKANA 100 MG TABS tablet TAKE 1 TABLET BY MOUTH ONCE DAILY 30 tablet 2  . Liraglutide (VICTOZA) 18 MG/3ML SOPN Inject under skin 1.8 mg daily in a.m. 6 mL 5  . losartan (COZAAR) 50 MG tablet TAKE 1 TABLET BY MOUTH ONCE DAILY 90 tablet 1  . metFORMIN (GLUCOPHAGE-XR) 500 MG 24 hr tablet TAKE 2 TABLETS BY MOUTH TWICE DAILY WITH A MEAL 120 tablet 2  . Multiple Vitamins-Minerals (MULTIVITAMIN WITH MINERALS) tablet Take 1 tablet by mouth daily.    . pantoprazole (PROTONIX) 40 MG tablet TAKE 1 TABLET BY MOUTH ONCE DAILY 90 tablet 1  . Probiotic Product (CVS PROBIOTIC) CAPS Take 1 daily 60 capsule 2  . simvastatin (ZOCOR) 10 MG tablet TAKE 1 TABLET BY MOUTH AT BEDTIME 90 tablet 1  . VENTOLIN HFA 108 (90 Base) MCG/ACT inhaler USE 2 PUFFS BY MOUTH 4 TIMES DAILY 54 g 12   No current facility-administered medications on file prior to visit.   Allergies  Allergen Reactions  . Metformin And Related Diarrhea  . Penicillins Rash   Family History  Problem Relation Age of Onset  . Diabetes Mother   . Hypertension Mother   . Diabetes Father   . Hypertension Father   . Hypertension Sister    PE: BP 124/74 mmHg  Pulse 95  Ht '5\' 6"'$  (1.676 m)  Wt 252 lb (114.306 kg)  BMI 40.69 kg/m2  SpO2 92% Body mass index is 40.69 kg/(m^2).  Wt Readings from Last 3 Encounters:  07/29/15 252 lb (114.306 kg)  06/26/15 252 lb 8 oz (114.533 kg)  04/29/15 259 lb (117.482 kg)   Constitutional: overweight, in NAD Eyes: PERRLA, EOMI, no exophthalmos ENT: moist mucous membranes, no thyromegaly, no cervical  lymphadenopathy Cardiovascular: RRR, No MRG, no LE edema Respiratory: CTA B Gastrointestinal: abdomen soft, NT, ND, BS+ Musculoskeletal: no deformities, strength intact in all 4; L ankle in brace  Skin: moist, warm, + stasis dermatitis rash B LE Neurological: no tremor with outstretched hands, DTR normal in all 4  ASSESSMENT: 1. DM2, non-insulin-dependent, uncontrolled, without complications  2. Obesity  PLAN:  1. Patient with long-standing, uncontrolled diabetes, on oral antidiabetic regimen, with better control. We tried to  move all Metformin at night, but this upset her stomach >> now taking it with lunch and dinner. Since she refuses insulin, we started a GLP1 R agonist at last visit (Victoza). Sugars much better except when she forgets her Victoza >> strongly advised not to skip doses.  - I suggested to:  Patient Instructions  Please continue: - Glipizide XL 10 mg in am  - Invokana 100 mg daily in am - Metformin XR 1000 mg 2x a day - Victoza 1.8 mg daily in a.m.  Please return in 3 month with your sugar log.   - continue checking sugars at different times of the day - check 2 times a day, rotating checks - advised for yearly eye exams >> UTD - checked HbA1c now: HbA1c 6.8% (much better!) - Return to clinic in 3 mo with sugar log   2. Obesity - congratulated her on the 7 lb weight loss (on Victoza)

## 2015-07-29 NOTE — Addendum Note (Signed)
Addended by: Caprice Beaver T on: 07/29/2015 04:45 PM   Modules accepted: Orders

## 2015-08-11 MED FILL — METFORMIN HCL ER 500 MG TAB: 500 | 30 days supply | Qty: 120 | Fill #2

## 2015-08-18 ENCOUNTER — Other Ambulatory Visit: Payer: Self-pay | Admitting: Pharmacist

## 2015-08-19 ENCOUNTER — Encounter: Payer: Self-pay | Admitting: Pharmacist

## 2015-08-19 NOTE — Patient Outreach (Signed)
[facility]) Care Management  [place]   08/19/2015  Ashley Gilmore Nov 30, 1965 [id number]  Subjective: Ashley Gilmore is a 50 year old female reporting for her follow up employee plan Link to Wellness visit.  She did not bring meter to clinic today but reports fasting blood glucose ranging from 130 to 170 mg/dL. She reports non-adherence to her medications occasionally (couple times per month) due to forgetting them in the morning. She does report 3-5 lb weight loss since she was initiated on Victoza.  She denies side effects to her medications.    Exercise- Patient states is still walking around 3 times per week for 15-30 minutes but this has decreased due to the summer heat.   Diet: eating more fresh frouit and limiting animal protein Breakfast: Protein bar and fresh fruit Snack: Almonds/Nuts and/or fruit Lunch: Leftovers or salad Snack: Granola or Protein bar Dinner: Meat + Green vegetable + starchy vegetable  Objective:  04/29/15 A1C; 9.2 07/29/15 A1C: 6.8  Encounter Medications: Outpatient Encounter Prescriptions as of 08/18/2015  Medication Sig  . ALPRAZolam (XANAX) 0.25 MG tablet TAKE 1/2 TO 1 TABLET BY MOUTH DAILY AS NEEDED  . b complex vitamins tablet Take 1 tablet by mouth daily.  Marland Kitchen BAYER MICROLET LANCETS lancets Use to test blood sugar 2 times daily as instructed. Dx: E11.65  . Blood Glucose Monitoring Suppl (BAYER CONTOUR NEXT MONITOR) w/Device KIT Use to test blood sugar daily.  Marland Kitchen buPROPion (WELLBUTRIN XL) 150 MG 24 hr tablet TAKE 1 TABLET BY MOUTH ONCE DAILY  . cetirizine (ZYRTEC) 10 MG tablet Take 10 mg by mouth daily.    . cholecalciferol (VITAMIN D) 1000 UNITS tablet Take 1,000 Units by mouth daily.   Marland Kitchen glipiZIDE (GLUCOTROL XL) 10 MG 24 hr tablet TAKE 1 TABLET BY MOUTH ONCE DAILY  . glucose blood (BAYER CONTOUR NEXT TEST) test strip Use to test blood sugar 2 times daily as instructed. Dx: E11.65  . Insulin Pen Needle 32G X 4 MM MISC Use 1 daily.  .  INVOKANA 100 MG TABS tablet TAKE 1 TABLET BY MOUTH ONCE DAILY  . Liraglutide (VICTOZA) 18 MG/3ML SOPN Inject under skin 1.8 mg daily in a.m.  Marland Kitchen losartan (COZAAR) 50 MG tablet TAKE 1 TABLET BY MOUTH ONCE DAILY  . metFORMIN (GLUCOPHAGE-XR) 500 MG 24 hr tablet TAKE 1 TABLETS BY MOUTH TWICE DAILY WITH A MEAL  . Multiple Vitamins-Minerals (MULTIVITAMIN WITH MINERALS) tablet Take 1 tablet by mouth daily.  . pantoprazole (PROTONIX) 40 MG tablet TAKE 1 TABLET BY MOUTH ONCE DAILY  . Probiotic Product (CVS PROBIOTIC) CAPS Take 1 daily  . simvastatin (ZOCOR) 10 MG tablet TAKE 1 TABLET BY MOUTH AT BEDTIME  . VENTOLIN HFA 108 (90 Base) MCG/ACT inhaler USE 2 PUFFS BY MOUTH 4 TIMES DAILY   No facility-administered encounter medications on file as of 08/18/2015.     Functional Status: In your present state of health, do you have any difficulty performing the following activities: 08/18/2015 05/05/2015  Hearing? N N  Vision? N N  Difficulty concentrating or making decisions? N N  Walking or climbing stairs? N N  Dressing or bathing? N N  Doing errands, shopping? N N  Some recent data might be hidden    Fall/Depression Screening: PHQ 2/9 Scores 08/18/2015 05/05/2015 07/18/2014  PHQ - 2 Score 2 0 3  PHQ- 9 Score 6 - 6    Assessment: Diabetes: A1C currently at goal of <7% at 6.8% on Invokana, glipizide, metformin and  Victoza.  Patient currently on statin but not currently on Aspirin. She reports adhering to low carbohydrate and low animal protein diet most of the time but her exercise has decreased due to the summer heat.  She also reports that she occasionally misses doses of her medications.    Plan: 1. Counseled pt on maintaining consistent exercise, low carbohydrate diet, and smoking cessation  2. Discussed goals of reducing stress by having a 15 minute walk per day by herself, not missing doses of medications by utilizing pill box or taking within ~12 hours if she forgets, and a goal of 10 lbs weight  loss over the next 90 days.  3. Patient has set 3 personal goals and will follow up in 3 months via telephone and 6 months clinic visit for review of diabetes management    [facility] CM Care Plan Problem One   Flowsheet Row Most Recent Value  Care Plan Problem One  Medication Non-adherence  Role Documenting the Problem One  Clinical Pharmacist  Care Plan for Problem One  Active  [facility] Long Term Goal (31-90 days)  patient will try to not miss doses of medications over the next 90 days as measured by patient report  [place] [facility] Term Goal Start Date  08/18/15  Interventions for Problem One Long Term Goal  Discussed adherence tools including the use of pill box and taking medications later in the day if she forgets the AM doses    [facility] CM Care Plan Problem Two   Flowsheet Row Most Recent Value  Care Plan Problem Two  Elevated Stress   Role Documenting the Problem Two  Clinical Pharmacist  Care Plan for Problem Two  Active  Interventions for Problem Two Long Term Goal   Provided stress reduction and exercise counseling   [facility] Long Term Goal (31-90) days  Increase exercise with one 15 min walk per day by herself without animals or other people to reduce stress as measured by patient report  [facility] Long Term Goal Start Date  04/28/15    [facility] CM Care Plan Problem Three   Flowsheet Row Most Recent Value  Care Plan Problem Three  Weight Management  Role Documenting the Problem Three  Clinical Pharmacist  Care Plan for Problem Three  Active  [facility] Long Term Goal (31-90) days  To achieve a 10 lb weight loss as measured by patient report over the next 90 days  [facility] Long Term Goal Start Date  08/18/15  Interventions for Problem Three Long Term Goal  Provided diet and exercise counseling and recommendations

## 2015-08-20 ENCOUNTER — Other Ambulatory Visit: Payer: Self-pay | Admitting: Internal Medicine

## 2015-08-21 ENCOUNTER — Other Ambulatory Visit: Payer: Self-pay

## 2015-08-21 MED ORDER — LIRAGLUTIDE 18 MG/3ML ~~LOC~~ SOPN: PEN_INJECTOR | SUBCUTANEOUS | 5 refills | Status: DC

## 2015-08-21 MED ORDER — CANAGLIFLOZIN 100 MG PO TABS: 100.0000 mg | ORAL_TABLET | Freq: Every day | ORAL | 2 refills | Status: DC

## 2015-08-21 MED FILL — VICTOZA 18 MG/3 ML INJECT P: 18 | 30 days supply | Qty: 9 | Fill #0

## 2015-08-21 MED FILL — INVOKANA 100 MG TABLET: 100 | 90 days supply | Qty: 90 | Fill #0

## 2015-09-09 ENCOUNTER — Other Ambulatory Visit: Payer: Self-pay | Admitting: Internal Medicine

## 2015-09-09 MED FILL — glipiZIDE XL 10 MG TB24: 10 | 90 days supply | Qty: 90 | Fill #0

## 2015-09-15 MED FILL — VICTOZA 18 MG/3 ML INJECT P: 18 | 30 days supply | Qty: 9 | Fill #1

## 2015-10-08 ENCOUNTER — Telehealth: Payer: Self-pay | Admitting: *Deleted

## 2015-10-08 ENCOUNTER — Other Ambulatory Visit: Payer: Self-pay | Admitting: Internal Medicine

## 2015-10-08 MED FILL — MICROLET LANCETS: 50 days supply | Qty: 100 | Fill #1

## 2015-10-08 MED FILL — CONTOUR NEXT STRIPS: 50 days supply | Qty: 100 | Fill #0

## 2015-10-08 MED FILL — SIMVASTATIN 10 MG TABLET: 10 | 90 days supply | Qty: 90 | Fill #0

## 2015-10-08 MED FILL — ALPRAZolam 0.25 MG TABS: 0.25 | 30 days supply | Qty: 30 | Fill #0

## 2015-10-08 MED FILL — VICTOZA 18 MG/3 ML INJECT P: 18 | 30 days supply | Qty: 9 | Fill #2

## 2015-10-08 MED FILL — PANTOPRAZOLE SOD DR 40 MG T: 40 | 90 days supply | Qty: 90 | Fill #0

## 2015-10-08 MED FILL — METFORMIN HCL ER 500 MG TAB: 500 | 30 days supply | Qty: 120 | Fill #0

## 2015-10-08 MED FILL — LOSARTAN POTASSIUM 50 MG TA: 50 | 90 days supply | Qty: 90 | Fill #0

## 2015-10-08 NOTE — Telephone Encounter (Signed)
Called in Refill per verbal request from Dr. Renold Genta.  Alprazolam 0.25 MG Tablet Sig: Take 1/2 to 1 tab by mouth daily PRN Qty: 30 Refill: 2

## 2015-10-30 ENCOUNTER — Ambulatory Visit: Payer: 59 | Admitting: Internal Medicine

## 2015-11-03 MED FILL — UNIFINE PENTIPS 32GX5/32": 32G X 4 MM | 90 days supply | Qty: 100 | Fill #2

## 2015-11-03 MED FILL — METFORMIN HCL ER 500 MG TAB: 500 | 30 days supply | Qty: 120 | Fill #1

## 2015-11-03 MED FILL — UNIFINE PENTIPS 32GX5/32: 32G X 4 MM | 90 days supply | Qty: 100 | Fill #2

## 2015-11-03 MED FILL — VICTOZA 18 MG/3 ML INJECT P: 18 | 30 days supply | Qty: 9 | Fill #3

## 2015-12-01 ENCOUNTER — Other Ambulatory Visit: Payer: Self-pay | Admitting: Internal Medicine

## 2015-12-01 MED FILL — METFORMIN HCL ER 500 MG TAB: 500 | 30 days supply | Qty: 120 | Fill #2

## 2015-12-01 MED FILL — glipiZIDE XL 10 MG TB24: 10 | 90 days supply | Qty: 90 | Fill #1

## 2015-12-02 MED FILL — VICTOZA 18 MG/3 ML INJECT P: 18 | 30 days supply | Qty: 9 | Fill #0

## 2015-12-02 MED FILL — INVOKANA 100 MG TABLET: 100 | 30 days supply | Qty: 30 | Fill #0

## 2015-12-05 ENCOUNTER — Ambulatory Visit (INDEPENDENT_AMBULATORY_CARE_PROVIDER_SITE_OTHER): Payer: 59 | Admitting: Internal Medicine

## 2015-12-05 ENCOUNTER — Encounter: Payer: Self-pay | Admitting: Internal Medicine

## 2015-12-05 VITALS — BP 140/82 | HR 87 | Wt 251.0 lb

## 2015-12-05 DIAGNOSIS — E1165 Type 2 diabetes mellitus with hyperglycemia: Secondary | ICD-10-CM

## 2015-12-05 DIAGNOSIS — IMO0001 Reserved for inherently not codable concepts without codable children: Secondary | ICD-10-CM

## 2015-12-05 LAB — POCT GLYCOSYLATED HEMOGLOBIN (HGB A1C): Hemoglobin A1C: 6.9

## 2015-12-05 NOTE — Patient Instructions (Addendum)
Please continue: - Glipizide XL 10 mg in am  - Invokana 100 mg daily in am - Metformin XR 1000 mg 2x a day - Victoza 1.8 mg daily in a.m.  Please add 1/2 a Glipizide XL tablet (5 mg) before a larger meal or if you have dessert.  Please come back for labs fasting.  Please return in 3 month with your sugar log.

## 2015-12-05 NOTE — Progress Notes (Addendum)
Patient ID: Ashley Gilmore, female   DOB: Sep 29, 1965, 50 y.o.   MRN: [id number]  HPI: Ashley Gilmore is a 50 y.o.-year-old female, returning for f/u for DM2, dx in GDM in 1995 and 1997, then DM2 in ~2010, non-insulin-dependent, uncontrolled, without complications. Last visit 3 mo ago.  She lost 7 lbs since last visit! (Victoza)  Last hemoglobin A1c was: Lab Results  Component Value Date   HGBA1C 6.8 07/29/2015   HGBA1C 9.2 04/29/2015   HGBA1C 8.0 (H) 12/19/2014  04/30/2014: HbA1c 7.5%  Pt is on a regimen of: - Glipizide XL 10 mg in am - Metformin XR 500 mg with lunch and 500 mg with dinner - started 01/2014 (higher doses >> diarrhea) - Invokana 100 mg daily in am - started 02/2014 - Victoza 1.8 mg daily - started 04/2015 She tried regular Metformin when prediabetic >> severe diarrhea.  Pt checks her sugars 0-1x a day and they are: - am: 127-155 >> 134-220, 233 >> 179-231 >> 180-200 >> 112-161, 191, 204 >> 136-201 - 2h after b'fast: n/c >> 110 >> n/c >> 184 - before lunch: n/c >> 174-239 >> 120, 121, 160 >> 149 >> 108-160 >> ? >> 78-97 >> 103-130, 240 - 2h after lunch: n/c >> 157 >> n/c >> 131 >> 163 - before dinner: 97-127 >> 64-123 >> 72-120 >> ? >> 131-171, 239 >> 69, 78-184, 240 - 2h after dinner: n/c >> 88, 89 >> n/c - bedtime: 111, 134, 190 >> 94-130 >> 101-200 >> 200s >> 107-172 >> 96, 121-248, 276 - nighttime: n/c No lows. Lowest sugar was 70 (not recently) >> 78 >> 78; she has hypoglycemia awareness at 70.  Highest sugar was 273 >> 190 >> 233 >> 231 >> 200s >> 239.  Glucometer:  [employer] Next  Pt's meals are: - Breakfast: protein bar - mid-am snack: apple + PB - Lunch: sandwich or salad - Dinner: meat + green veggie + starch - Snacks: 2 fruit, [name], rarely sweet tea  - no CKD, last BUN/creatinine:  Lab Results  Component Value Date   BUN 10 12/19/2014   CREATININE 0.72 12/19/2014  On Losartan - last set of lipids: Lab Results  Component  Value Date   CHOL 144 12/19/2014   HDL 42 (L) 12/19/2014   LDLCALC 77 12/19/2014   TRIG 126 12/19/2014   CHOLHDL 3.4 12/19/2014  On Zocor 10  - qod b/c mm pain. - last eye exam was in 05/2015. Reportedly no DR.  - no numbness and tingling in her feet.  ROS: Constitutional: no weight gain, no fatigue Eyes: no blurry vision, no xerophthalmia ENT: no sore throat, no nodules palpated in throat, no dysphagia/odynophagia, no hoarseness Cardiovascular: no CP/SOB/palpitations/leg swelling Respiratory: no cough/SOB/wheezing Gastrointestinal: no N/V/D/C/no acid reflux Musculoskeletal: + muscle/+ joint aches Skin: no rashes Neurological: no tremors/numbness/tingling/dizziness, no HA  I reviewed pt's medications, allergies, [name], social hx, family hx, [name], unchanged from my initial visit note:  Past Medical History:  Diagnosis Date  . Hyperlipidemia   . Insomnia   . Obesity    Past Surgical History:  Procedure Laterality Date  . TUBAL LIGATION     History   Social History  . Marital Status: married    Spouse Name: N/A    Number of Children: 2   Occupational History  . Music therapist   Social History Main Topics  . Smoking status: Current  Every Day Smoker -- 0.5 packs/day    Types: Cigarettes  . Smokeless tobacco: Never Used  . Alcohol Use: No  . Drug Use: No   Current Outpatient Prescriptions on File Prior to Visit  Medication Sig Dispense Refill  . ALPRAZolam (XANAX) 0.25 MG tablet TAKE 1/2 TO 1 TABLET BY MOUTH DAILY AS NEEDED FOR ANXIETY 30 tablet 2  . b complex vitamins tablet Take 1 tablet by mouth daily.    Marland Kitchen BAYER MICROLET LANCETS lancets Use to test blood sugar 2 times daily as instructed. Dx: E11.65 100 each 5  . Blood Glucose Monitoring Suppl (BAYER CONTOUR NEXT MONITOR) w/Device KIT Use to test blood sugar daily. 1 kit 0  . buPROPion (WELLBUTRIN XL) 150 MG 24 hr tablet TAKE 1 TABLET BY MOUTH  ONCE DAILY 90 tablet 1  . cetirizine (ZYRTEC) 10 MG tablet Take 10 mg by mouth daily.      . cholecalciferol (VITAMIN D) 1000 UNITS tablet Take 1,000 Units by mouth daily.     Marland Kitchen GLIPIZIDE XL 10 MG 24 hr tablet TAKE 1 TABLET BY MOUTH ONCE DAILY 90 tablet 1  . glucose blood (BAYER CONTOUR NEXT TEST) test strip Use to test blood sugar 2 times daily as instructed. Dx: E11.65 100 each 5  . Insulin Pen Needle 32G X 4 MM MISC Use 1 daily. 90 each 3  . INVOKANA 100 MG TABS tablet TAKE 1 TABLET BY MOUTH ONCE DAILY 30 tablet 2  . losartan (COZAAR) 50 MG tablet TAKE 1 TABLET BY MOUTH ONCE DAILY 90 tablet 1  . metFORMIN (GLUCOPHAGE-XR) 500 MG 24 hr tablet TAKE 1 TABLETS BY MOUTH TWICE DAILY WITH A MEAL 180 tablet 2  . metFORMIN (GLUCOPHAGE-XR) 500 MG 24 hr tablet TAKE 2 TABLETS BY MOUTH TWICE DAILY WITH A MEAL 120 tablet 2  . Multiple Vitamins-Minerals (MULTIVITAMIN WITH MINERALS) tablet Take 1 tablet by mouth daily.    . pantoprazole (PROTONIX) 40 MG tablet TAKE 1 TABLET BY MOUTH ONCE DAILY 90 tablet 1  . Probiotic Product (CVS PROBIOTIC) CAPS Take 1 daily 60 capsule 2  . simvastatin (ZOCOR) 10 MG tablet TAKE 1 TABLET BY MOUTH AT BEDTIME 90 tablet 1  . VENTOLIN HFA 108 (90 Base) MCG/ACT inhaler USE 2 PUFFS BY MOUTH 4 TIMES DAILY 54 g 12  . VICTOZA 18 MG/3ML SOPN INJECT UNDER SKIN 1.8 MG DAILY IN A.M. 6 mL 5   No current facility-administered medications on file prior to visit.    Allergies  Allergen Reactions  . Metformin And Related Diarrhea  . Penicillins Rash   Family History  Problem Relation Age of Onset  . Diabetes Mother   . Hypertension Mother   . Diabetes Father   . Hypertension Father   . Hypertension Sister    PE: BP 140/82   Pulse 87   Wt 251 lb (113.9 kg)   SpO2 97%   BMI 40.51 kg/m  Body mass index is 40.51 kg/m.  Wt Readings from Last 3 Encounters:  12/05/15 251 lb (113.9 kg)  08/18/15 246 lb 6.4 oz (111.8 kg)  07/29/15 252 lb (114.3 kg)   Constitutional: overweight,  in NAD Eyes: PERRLA, EOMI, no exophthalmos ENT: moist mucous membranes, no thyromegaly, no cervical lymphadenopathy Cardiovascular: RRR, No MRG, no LE edema Respiratory: CTA B Gastrointestinal: abdomen soft, NT, ND, BS+ Musculoskeletal: no deformities, strength intact in all 4; L ankle in brace  Skin: moist, warm Neurological: no tremor with outstretched hands, DTR normal in all 4  ASSESSMENT: 1. DM2, non-insulin-dependent, uncontrolled, without long term complications, but with hyperglycemia  PLAN:  1. Patient with long-standing, uncontrolled diabetes, on oral antidiabetic regimen + Injectable GLP1 R agonist, with slightly worse  Control c/w last visit (more hyperglycemic spikes, especially after dinner). We tried to move all Metformin at night, but this upset her stomach. Since she refused insulin, we started a GLP1 R agonist at last visit (Victoza). Will continue this but also advised her to add 5 mg Glipizide before a larger meal. - I suggested to:  Patient Instructions  Please continue: - Glipizide XL 10 mg in am  - Invokana 100 mg daily in am - Metformin XR 1000 mg 2x a day - Victoza 1.8 mg daily in a.m.  Please add 1/2 a Glipizide XL tablet (5 mg) before a larger meal or if you have dessert.  Please come back for labs fasting.  Please return in 3 month with your sugar log.   - continue checking sugars at different times of the day - check 2 times a day, rotating checks - advised for yearly eye exams >> UTD - she will return fasting for Lipid panel and CMP - checked HbA1c now: HbA1c 6.9% (stable!) - Return to clinic in 3 mo with sugar log   Component     Latest Ref Rng & Units 12/10/2015  Sodium     135 - 146 mmol/L 142  Potassium     3.5 - 5.3 mmol/L 4.8  Chloride     98 - 110 mmol/L 105  CO2     20 - 31 mmol/L 30  Glucose     65 - 99 mg/dL 128 (H)  BUN     7 - 25 mg/dL 12  Creatinine     0.50 - 1.05 mg/dL 0.73  Total Bilirubin     0.2 - 1.2 mg/dL 0.5   Alkaline Phosphatase     33 - 130 U/L 101  AST     10 - 35 U/L 16  ALT     6 - 29 U/L 17  Total Protein     6.1 - 8.1 g/dL 6.4  Albumin     3.6 - 5.1 g/dL 3.7  Calcium     8.6 - 10.4 mg/dL 8.9  GFR, Est African American     >=60 mL/min >89  GFR, Est Non African American     >=60 mL/min >89  Cholesterol     0 - 200 mg/dL 200  Triglycerides     0.0 - 149.0 mg/dL 140.0  HDL Cholesterol     >39.00 mg/dL 44.00  VLDL     0.0 - 40.0 mg/dL 28.0  LDL (calc)     0 - 99 mg/dL 128 (H)  Total CHOL/HDL Ratio      5  NonHDL      155.80   LDL higher. Will advise to cut down saturated fat and conc. Sweets. We'll also check with her if she is taking the Zocor consistently.  Philemon Kingdom, MD PhD [facility] Endocrinology

## 2015-12-05 NOTE — Addendum Note (Signed)
Addended by: Inocencio Homes on: 12/05/2015 03:34 PM   Modules accepted: Orders

## 2015-12-10 ENCOUNTER — Other Ambulatory Visit (INDEPENDENT_AMBULATORY_CARE_PROVIDER_SITE_OTHER): Payer: 59

## 2015-12-10 DIAGNOSIS — E1165 Type 2 diabetes mellitus with hyperglycemia: Secondary | ICD-10-CM

## 2015-12-10 DIAGNOSIS — IMO0001 Reserved for inherently not codable concepts without codable children: Secondary | ICD-10-CM

## 2015-12-10 LAB — COMPLETE METABOLIC PANEL WITH GFR
ALT: 17 U/L (ref 6–29)
AST: 16 U/L (ref 10–35)
Albumin: 3.7 g/dL (ref 3.6–5.1)
Alkaline Phosphatase: 101 U/L (ref 33–130)
BUN: 12 mg/dL (ref 7–25)
CO2: 30 mmol/L (ref 20–31)
Calcium: 8.9 mg/dL (ref 8.6–10.4)
Chloride: 105 mmol/L (ref 98–110)
Creat: 0.73 mg/dL (ref 0.50–1.05)
GFR, Est African American: >89 mL/min (ref >=60)
GFR, Est Non African American: >89 mL/min (ref >=60)
Glucose, Bld: 128 mg/dL — ABNORMAL HIGH (ref 65–99)
Potassium: 4.8 mmol/L (ref 3.5–5.3)
Sodium: 142 mmol/L (ref 135–146)
Total Bilirubin: 0.5 mg/dL (ref 0.2–1.2)
Total Protein: 6.4 g/dL (ref 6.1–8.1)

## 2015-12-10 LAB — LIPID PANEL
Cholesterol: 200 mg/dL (ref 0–200)
HDL: 44 mg/dL (ref >39.00)
LDL Cholesterol: 128 mg/dL — ABNORMAL HIGH (ref 0–99)
NonHDL: 155.8
Total CHOL/HDL Ratio: 5
Triglycerides: 140 mg/dL (ref 0.0–149.0)
VLDL: 28 mg/dL (ref 0.0–40.0)

## 2015-12-19 MED FILL — ALPRAZolam 0.25 MG TABS: 0.25 | 30 days supply | Qty: 30 | Fill #1

## 2015-12-19 MED FILL — VENTOLIN HFA 90 MCG INHALER: 108 (90 BAS | 75 days supply | Qty: 54 | Fill #1

## 2015-12-25 MED FILL — VICTOZA 18 MG/3 ML INJECT P: 18 | 30 days supply | Qty: 9 | Fill #1

## 2015-12-25 MED FILL — INVOKANA 100 MG TABLET: 100 | 30 days supply | Qty: 30 | Fill #1

## 2015-12-26 ENCOUNTER — Encounter: Payer: 59 | Admitting: Internal Medicine

## 2015-12-30 ENCOUNTER — Other Ambulatory Visit: Payer: Self-pay | Admitting: Internal Medicine

## 2015-12-30 MED FILL — METFORMIN HCL ER 500 MG TAB: 500 | 30 days supply | Qty: 120 | Fill #0

## 2015-12-30 MED FILL — PANTOPRAZOLE SOD DR 40 MG T: 40 | 90 days supply | Qty: 90 | Fill #1

## 2015-12-30 MED FILL — LOSARTAN POTASSIUM 50 MG TA: 50 | 90 days supply | Qty: 90 | Fill #1

## 2015-12-30 MED FILL — SIMVASTATIN 10 MG TABLET: 10 | 90 days supply | Qty: 90 | Fill #1

## 2016-01-20 MED FILL — INVOKANA 100 MG TABLET: 100 | 30 days supply | Qty: 30 | Fill #2

## 2016-01-20 MED FILL — VICTOZA 18 MG/3 ML INJECT P: 18 | 30 days supply | Qty: 9 | Fill #2

## 2016-01-20 MED FILL — ALPRAZolam 0.25 MG TABS: 0.25 | 30 days supply | Qty: 30 | Fill #2

## 2016-01-22 ENCOUNTER — Ambulatory Visit (INDEPENDENT_AMBULATORY_CARE_PROVIDER_SITE_OTHER): Payer: 59 | Admitting: Internal Medicine

## 2016-01-22 ENCOUNTER — Encounter: Payer: Self-pay | Admitting: Internal Medicine

## 2016-01-22 VITALS — BP 144/66 | HR 106 | Temp 98.0°F | Resp 18 | Wt 246.0 lb

## 2016-01-22 DIAGNOSIS — E119 Type 2 diabetes mellitus without complications: Secondary | ICD-10-CM | POA: Diagnosis not present

## 2016-01-22 DIAGNOSIS — J040 Acute laryngitis: Secondary | ICD-10-CM | POA: Diagnosis not present

## 2016-01-22 DIAGNOSIS — J069 Acute upper respiratory infection, unspecified: Secondary | ICD-10-CM

## 2016-01-22 DIAGNOSIS — H6691 Otitis media, unspecified, right ear: Secondary | ICD-10-CM

## 2016-01-22 MED ORDER — MAGIC MOUTHWASH: 10.0000 mL | Freq: Four times a day (QID) | ORAL | 0 refills | Status: DC

## 2016-01-22 MED ORDER — AZITHROMYCIN 250 MG PO TABS: ORAL_TABLET | ORAL | 0 refills | Status: DC

## 2016-01-22 MED ORDER — PREDNISONE 10 MG PO TABS: ORAL_TABLET | ORAL | 0 refills | Status: DC

## 2016-01-22 MED ORDER — HYDROCODONE-HOMATROPINE 5-1.5 MG/5ML PO SYRP: 5.0000 mL | ORAL_SOLUTION | Freq: Three times a day (TID) | ORAL | 0 refills | Status: DC | PRN

## 2016-01-22 MED FILL — HYDROCODONE-HOMATROPINE SYR: 5-1.5 | 8 days supply | Qty: 120 | Fill #0

## 2016-01-22 MED FILL — predniSONE 10 MG (21) TBPK: 10 | 6 days supply | Qty: 21 | Fill #0

## 2016-01-22 MED FILL — AZITHROMYCIN 250 MG TABLET: 250 | 5 days supply | Qty: 6 | Fill #0

## 2016-01-22 NOTE — Patient Instructions (Signed)
Zithromax Z-PAK as directed. Hycodan as needed for cough. Magic mouthwash if needed for stomatitis. Sterapred DS 10 mg six-day Dosepak. Rest and drink plenty of fluids.

## 2016-01-22 NOTE — Progress Notes (Signed)
   Subjective:    Patient ID: Ashley Gilmore, female    DOB: 07-12-1965, 51 y.o.   MRN: [id number]  HPI  51 year old White Female with 2 week history URI. Says symptoms started after getting a live Christmas tree. Has had hoarseness and congestion. No fever or chills. Right ear feels full. She does have diabetes mellitus. History of asthma. She does have a Ventolin inhaler. Has had some wheezing.    Review of Systems  See above     Objective:   Physical Exam Skin warm and dry. Nodes none. Right TM is full and pale. Pharynx is injected without exudate. Neck is supple without adenopathy. Chest-essentially clear without rales.  She sounds very hoarse when she speaks       Assessment & Plan:  Laryngitis  Right otitis media  Acute URI  Diabetes mellitus  History of asthma  Hycodan 1 teaspoon by mouth every 6 hours when necessary cough. Ventolin inhaler 2 sprays by mouth 4 times daily. Sterapred DS 10 mg 6 day dosepak. Zithromax Z-PAK take 2 tablets day one followed by 1 tablet days 2 through 5. Magic mouthwash should stomatitis developed while on steroids and antibiotics.

## 2016-01-22 NOTE — Progress Notes (Signed)
   Subjective:    Patient ID: Ashley Gilmore, female    DOB: 12-03-1965, 51 y.o.   MRN: [id number]  HPI    Review of Systems     Objective:   Physical Exam        Assessment & Plan:

## 2016-01-28 MED FILL — METFORMIN HCL ER 500 MG TAB: 500 | 30 days supply | Qty: 120 | Fill #1

## 2016-01-28 MED FILL — UNIFINE PENTIPS 32GX5/32: 32G X 4 MM | 90 days supply | Qty: 100 | Fill #3

## 2016-01-28 MED FILL — MAGIC MOUTHWASH BOP FORM: 6 days supply | Qty: 240 | Fill #0

## 2016-01-28 MED FILL — UNIFINE PENTIPS 32GX5/32": 32G X 4 MM | 90 days supply | Qty: 100 | Fill #3

## 2016-02-12 ENCOUNTER — Other Ambulatory Visit (HOSPITAL_COMMUNITY)
Admission: RE | Admit: 2016-02-12 | Discharge: 2016-02-12 | Disposition: A | Discharge status: Home / Self Care | Payer: 59 | Source: Ambulatory Visit | Attending: Internal Medicine | Admitting: Internal Medicine

## 2016-02-12 ENCOUNTER — Ambulatory Visit (INDEPENDENT_AMBULATORY_CARE_PROVIDER_SITE_OTHER): Payer: 59 | Admitting: Internal Medicine

## 2016-02-12 ENCOUNTER — Encounter: Payer: 59 | Admitting: Internal Medicine

## 2016-02-12 ENCOUNTER — Encounter: Payer: Self-pay | Admitting: Internal Medicine

## 2016-02-12 VITALS — BP 120/70 | HR 90 | Resp 12 | Ht 65.5 in | Wt 247.0 lb

## 2016-02-12 DIAGNOSIS — Z87891 Personal history of nicotine dependence: Secondary | ICD-10-CM | POA: Diagnosis not present

## 2016-02-12 DIAGNOSIS — Z01419 Encounter for gynecological examination (general) (routine) without abnormal findings: Secondary | ICD-10-CM | POA: Insufficient documentation

## 2016-02-12 DIAGNOSIS — E785 Hyperlipidemia, unspecified: Secondary | ICD-10-CM | POA: Diagnosis not present

## 2016-02-12 DIAGNOSIS — E1165 Type 2 diabetes mellitus with hyperglycemia: Secondary | ICD-10-CM | POA: Diagnosis not present

## 2016-02-12 DIAGNOSIS — Z8659 Personal history of other mental and behavioral disorders: Secondary | ICD-10-CM

## 2016-02-12 DIAGNOSIS — E8881 Metabolic syndrome: Secondary | ICD-10-CM

## 2016-02-12 DIAGNOSIS — J069 Acute upper respiratory infection, unspecified: Secondary | ICD-10-CM

## 2016-02-12 DIAGNOSIS — I10 Essential (primary) hypertension: Secondary | ICD-10-CM

## 2016-02-12 DIAGNOSIS — Z1329 Encounter for screening for other suspected endocrine disorder: Secondary | ICD-10-CM | POA: Diagnosis not present

## 2016-02-12 DIAGNOSIS — Z0000 Encounter for general adult medical examination without abnormal findings: Secondary | ICD-10-CM | POA: Diagnosis not present

## 2016-02-12 DIAGNOSIS — K219 Gastro-esophageal reflux disease without esophagitis: Secondary | ICD-10-CM

## 2016-02-12 LAB — POC URINALSYSI DIPSTICK (AUTOMATED)
Bilirubin, UA: NEGATIVE
Glucose, UA: 2
Ketones, UA: NEGATIVE
Leukocytes, UA: NEGATIVE
Nitrite, UA: NEGATIVE
Protein, UA: NEGATIVE
Spec Grav, UA: 1.02
Urobilinogen, UA: NEGATIVE
pH, UA: 7

## 2016-02-12 LAB — TSH: TSH: 1.55 mIU/L

## 2016-02-12 MED ORDER — PREDNISONE 10 MG PO TABS: ORAL_TABLET | ORAL | 0 refills | Status: DC

## 2016-02-12 MED FILL — predniSONE 10 MG (21) TBPK: 10 | 6 days supply | Qty: 21 | Fill #0

## 2016-02-12 NOTE — Progress Notes (Signed)
Subjective:    Patient ID: Ashley Gilmore, female    DOB: 26-Sep-1965, 51 y.o.   MRN: [id number]  HPI 51 year old Female for health maintenance exam and evaluation of medical issues. Saw Endocrinologist recently and AIC was 6.9%.  Has protracted cough. On Jan. 4,  treated with prednisone and Zithromax but still coughing. Smoking 5 cigarettes daily.  She has a history of controlled type 2 diabetes mellitus, GE reflux, obesity, insomnia, migraine headaches and asthma.  Past medical history: Oral surgery 1976 at which time wisdom teeth extracted 4. Bilateral tubal ligation 2002. She's had 2 pregnancies and no miscarriages. History of postpartum depression after birth of her children for 1 or 2 years. History of seasonal allergic rhinitis. She had gestational diabetes.  Doesn't really have time to exercise. Works full-time.  She is allergic to penicillin-causes shortness of breath and a rash.  Social history: She is married. 2 daughters both of whom attend [employer]. She does not consume alcohol. Smokes 5 cigarettes daily. Husband is a press man. She works as a Clinical biochemist at [employer]. Completed 2 years of college.  Family history: Parents living. Father with history of hypertension and diabetes. Mother with history of hypertension and diabetes. Her mother is blind. One sister with history of hypertension.    Review of Systems  Constitutional: Positive for fatigue.  Respiratory: Positive for cough.   Cardiovascular: Negative.   Genitourinary: Negative.   Psychiatric/Behavioral: Negative.        Objective:   Physical Exam  Constitutional: She is oriented to person, place, and time. She appears well-developed and well-nourished. No distress.  HENT:  Head: Normocephalic and atraumatic.  Right Ear: External ear normal.  Left Ear: External ear normal.  Mouth/Throat: Oropharynx is clear and moist.  Eyes: Conjunctivae and EOM are normal. Pupils are equal,  round, and reactive to light. Right eye exhibits no discharge. Left eye exhibits no discharge. No scleral icterus.  Neck: Neck supple. No JVD present. No thyromegaly present.  Cardiovascular: Normal rate, regular rhythm, normal heart sounds and intact distal pulses.   No murmur heard. Pulmonary/Chest: Effort normal and breath sounds normal. No respiratory distress. She has no wheezes. She has no rales.  Abdominal: Soft. Bowel sounds are normal. She exhibits no distension and no mass. There is no tenderness. There is no rebound and no guarding.  Genitourinary:  Genitourinary Comments: Pap taken. Bimanual normal.  Musculoskeletal: She exhibits no edema.  Lymphadenopathy:    She has no cervical adenopathy.  Neurological: She is alert and oriented to person, place, and time. She has normal reflexes. No cranial nerve deficit. Coordination normal.  Skin: Skin is warm and dry. No rash noted. She is not diaphoretic.  Psychiatric: She has a normal mood and affect. Her behavior is normal. Judgment and thought content normal.  Vitals reviewed.         Assessment & Plan:  Patient had lipid panel November 2017 with an LDL cholesterol of 128. Previously LDL had been normal. CBC and TSH drawn January 25 are within normal limits. Urinalysis shows trace blood on dipstick. A1c was 6.9% in November.  Diabetes mellitus-currently on Victoza, metformin, Invokana and glipizide  Obesity-encouraged diet exercise and weight loss  History of smoking-smokes 5 cigarettes daily and not interested in quitting  Elevated LDL  GERD-on generic Protonix  Protracted cough-repeat  prednisone dosepak  History of migraine headaches  Allergic rhinitis  Essential hypertension on losartan 50 mg daily  Anxiety depression-has  Xanax and Wellbutrin prescribed  Plan: Encouraged diet exercise and weight loss. Continue same medications. For cough, refill prednisone in tapering course as directed #20 one 10 mg tablets.  Ventolin inhaler 2 sprays by mouth 4 times daily. Referral to GI for colonoscopy.

## 2016-02-13 LAB — CBC WITH DIFFERENTIAL/PLATELET

## 2016-02-13 LAB — MICROALBUMIN / CREATININE URINE RATIO
Creatinine, Urine: 77 mg/dL (ref 20–320)
Microalb Creat Ratio: 3 mcg/mg creat (ref <30)
Microalb, Ur: 0.2 mg/dL

## 2016-02-13 MED FILL — VICTOZA 18 MG/3 ML INJECT P: 18 | 30 days supply | Qty: 9 | Fill #3

## 2016-02-23 LAB — CYTOLOGY - PAP
Adequacy: ABSENT
Diagnosis: NEGATIVE

## 2016-03-05 ENCOUNTER — Ambulatory Visit: Payer: 59 | Admitting: Internal Medicine

## 2016-03-08 ENCOUNTER — Other Ambulatory Visit: Payer: Self-pay | Admitting: Internal Medicine

## 2016-03-08 MED FILL — INVOKANA 100 MG TABLET: 100 | 30 days supply | Qty: 30 | Fill #0

## 2016-03-08 MED FILL — glipiZIDE XL 10 MG TB24: 10 | 90 days supply | Qty: 90 | Fill #0

## 2016-03-08 MED FILL — VICTOZA 18 MG/3 ML INJECT P: 18 | 30 days supply | Qty: 9 | Fill #0

## 2016-03-08 MED FILL — METFORMIN HCL ER 500 MG TAB: 500 | 30 days supply | Qty: 120 | Fill #2

## 2016-03-16 NOTE — Patient Instructions (Signed)
Take another course of prednisone tapering over 6 days as directed for cough. Continue same medications and return in 6 months. Continue working with endocrinologist. Have colonoscopy.

## 2016-03-24 ENCOUNTER — Other Ambulatory Visit: Payer: Self-pay | Admitting: Internal Medicine

## 2016-03-24 MED FILL — PANTOPRAZOLE SOD DR 40 MG T: 40 | 90 days supply | Qty: 90 | Fill #0

## 2016-03-24 MED FILL — LOSARTAN POTASSIUM 50 MG TA: 50 | 90 days supply | Qty: 90 | Fill #0

## 2016-03-24 MED FILL — ALPRAZolam 0.25 MG TABS: 0.25 | 30 days supply | Qty: 30 | Fill #0

## 2016-03-24 MED FILL — SIMVASTATIN 10 MG TABLET: 10 | 90 days supply | Qty: 90 | Fill #0

## 2016-03-24 NOTE — Telephone Encounter (Signed)
Refill x 6 months 

## 2016-04-01 MED FILL — VICTOZA 18 MG/3 ML INJECT P: 18 | 30 days supply | Qty: 9 | Fill #1

## 2016-04-01 MED FILL — INVOKANA 100 MG TABLET: 100 | 30 days supply | Qty: 30 | Fill #1

## 2016-04-20 ENCOUNTER — Other Ambulatory Visit: Payer: Self-pay

## 2016-04-20 ENCOUNTER — Ambulatory Visit (INDEPENDENT_AMBULATORY_CARE_PROVIDER_SITE_OTHER): Payer: 59 | Admitting: Internal Medicine

## 2016-04-20 ENCOUNTER — Encounter: Payer: Self-pay | Admitting: Internal Medicine

## 2016-04-20 VITALS — BP 130/80 | HR 90 | Ht 66.0 in | Wt 245.0 lb

## 2016-04-20 DIAGNOSIS — E1165 Type 2 diabetes mellitus with hyperglycemia: Secondary | ICD-10-CM

## 2016-04-20 LAB — POCT GLYCOSYLATED HEMOGLOBIN (HGB A1C): Hemoglobin A1C: 7.7

## 2016-04-20 MED ORDER — GLUCOSE BLOOD VI STRP: ORAL_STRIP | 5 refills | Status: DC

## 2016-04-20 MED FILL — ACCU-CHEK GUIDE TEST STRIP: 90 days supply | Qty: 200 | Fill #0

## 2016-04-20 NOTE — Progress Notes (Signed)
Patient ID: Aza Dantes Bowie, female   DOB: 20-Aug-1965, 51 y.o.   MRN: [id number]  HPI: Miquel Lamson Stoker is a 51 y.o.-year-old female, returning for f/u for DM2, dx in GDM in 1995 and 1997, then DM2 in ~2010, non-insulin-dependent, uncontrolled, without complications. Last visit 5 mo ago.  She had a lot of stress in her life (nephew with traumatic brain injury after a bar fight, sister-in-law died due to cardiac disease) and she also was on Prednisone for URI >> she was not paying attention to how she took her medications >> sugars higher.  She is now in the [place] [facility].   Last hemoglobin A1c was: Lab Results  Component Value Date   HGBA1C 6.9 12/05/2015   HGBA1C 6.8 07/29/2015   HGBA1C 9.2 04/29/2015  04/30/2014: HbA1c 7.5%  Pt is on a regimen of: - Glipizide XL 10 mg in am + 1/2 tab before a large meal - Metformin XR 500 mg with b'fast and 500 mg with dinner -  (higher doses >> diarrhea) - Invokana 100 mg daily in am - started 02/2014 - Victoza 1.8 mg daily - started 04/2015 She tried regular Metformin when prediabetic >> severe diarrhea.  Pt checks her sugars 0-1x a day and they are: - am:  112-161, 191, 204 >> 136-201 >> 128-201, 236 - 2h after b'fast: n/c >> 110 >> n/c >> 184  - before lunch:  108-160 >> ? >> 78-97 >> 103-130, 240 >> 107, 122 - 2h after lunch: n/c >> 157 >> n/c >> 131 >> 163 >> n/c - before dinner:131-171, 239 >> 69, 78-184, 240 >> 79, 85-106 - 2h after dinner: n/c >> 88, 89 >> n/c - bedtime: 101-200 >> 200s >> 107-172 >> 96, 121-248, 276 >> 94-117 - nighttime: n/c No lows. Lowest sugar was 70 (not recently) >> 78 >> 78 >> 79; she has hypoglycemia awareness at 70.  Highest sugar was 273 >> 190 >> 233 >> 231 >> 200s >> 239 >> 236.  Glucometer:  [employer] Next  Pt's meals are: - Breakfast: protein bar - mid-am snack: apple + PB - Lunch: sandwich or salad - Dinner: meat + green veggie + starch - Snacks: 2 fruit, [name], rarely sweet tea  -  no CKD, last BUN/creatinine:  Lab Results  Component Value Date   BUN 12 12/10/2015   CREATININE 0.73 12/10/2015  On Losartan - last set of lipids: Lab Results  Component Value Date   CHOL 200 12/10/2015   HDL 44.00 12/10/2015   LDLCALC 128 (H) 12/10/2015   TRIG 140.0 12/10/2015   CHOLHDL 5 12/10/2015  On Zocor 10  - qod b/c mm pain. - last eye exam was in 05/2015. Reportedly no DR.  - no numbness and tingling in her feet.  ROS: Constitutional: no weight gain, no fatigue Eyes: no blurry vision, no xerophthalmia ENT: no sore throat, no nodules palpated in throat, no dysphagia/odynophagia, no hoarseness Cardiovascular: no CP/SOB/palpitations/leg swelling Respiratory: no cough/SOB/wheezing Gastrointestinal: no N/V/D/C/no acid reflux Musculoskeletal: + muscle/+ joint aches Skin: no rashes Neurological: no tremors/numbness/tingling/dizziness, no HA  I reviewed pt's medications, allergies, [name], social hx, family hx, [name], unchanged from my initial visit note:  Past Medical History:  Diagnosis Date  . Hyperlipidemia   . Insomnia   . Obesity    Past Surgical History:  Procedure Laterality Date  . TUBAL LIGATION     History  Social History  . Marital Status: married    Spouse Name: N/A    Number of Children: 2   Occupational History  . Music therapist   Social History Main Topics  . Smoking status: Current Every Day Smoker -- 0.5 packs/day    Types: Cigarettes  . Smokeless tobacco: Never Used  . Alcohol Use: No  . Drug Use: No   Current Outpatient Prescriptions on File Prior to Visit  Medication Sig Dispense Refill  . ALPRAZolam (XANAX) 0.25 MG tablet TAKE 1/2 TO 1 TABLET BY MOUTH DAILY AS NEEDED FOR ANXIETY 30 tablet 5  . b complex vitamins tablet Take 1 tablet by mouth daily.    Marland Kitchen BAYER MICROLET LANCETS lancets Use to test blood sugar 2 times daily as instructed. Dx: E11.65 100 each 5  .  Blood Glucose Monitoring Suppl (BAYER CONTOUR NEXT MONITOR) w/Device KIT Use to test blood sugar daily. 1 kit 0  . cetirizine (ZYRTEC) 10 MG tablet Take 10 mg by mouth daily.      . cholecalciferol (VITAMIN D) 1000 UNITS tablet Take 1,000 Units by mouth daily.     Marland Kitchen GLIPIZIDE XL 10 MG 24 hr tablet TAKE 1 TABLET BY MOUTH ONCE DAILY 90 tablet 1  . glucose blood (BAYER CONTOUR NEXT TEST) test strip Use to test blood sugar 2 times daily as instructed. Dx: E11.65 100 each 5  . Insulin Pen Needle 32G X 4 MM MISC Use 1 daily. 90 each 3  . INVOKANA 100 MG TABS tablet TAKE 1 TABLET BY MOUTH ONCE DAILY 30 tablet 2  . losartan (COZAAR) 50 MG tablet TAKE 1 TABLET BY MOUTH ONCE DAILY 90 tablet 1  . metFORMIN (GLUCOPHAGE-XR) 500 MG 24 hr tablet TAKE 2 TABLETS BY MOUTH TWICE DAILY WITH A MEAL 120 tablet 2  . Multiple Vitamins-Minerals (MULTIVITAMIN WITH MINERALS) tablet Take 1 tablet by mouth daily.    . pantoprazole (PROTONIX) 40 MG tablet TAKE 1 TABLET BY MOUTH ONCE DAILY 90 tablet 1  . Probiotic Product (CVS PROBIOTIC) CAPS Take 1 daily 60 capsule 2  . simvastatin (ZOCOR) 10 MG tablet TAKE 1 TABLET BY MOUTH AT BEDTIME 90 tablet 1  . VENTOLIN HFA 108 (90 Base) MCG/ACT inhaler USE 2 PUFFS BY MOUTH 4 TIMES DAILY 54 g 12  . VICTOZA 18 MG/3ML SOPN INJECT 1.8 MG UNDER SKIN DAILY IN A.M. AS DIRECTED 6 mL 5  . buPROPion (WELLBUTRIN XL) 150 MG 24 hr tablet TAKE 1 TABLET BY MOUTH ONCE DAILY (Patient not taking: Reported on 01/22/2016) 90 tablet 1  . HYDROcodone-homatropine (HYCODAN) 5-1.5 MG/5ML syrup Take 5 mLs by mouth every 8 (eight) hours as needed for cough. (Patient not taking: Reported on 04/20/2016) 120 mL 0   No current facility-administered medications on file prior to visit.    Allergies  Allergen Reactions  . Metformin And Related Diarrhea  . Penicillins Rash   Family History  Problem Relation Age of Onset  . Diabetes Mother   . Hypertension Mother   . Diabetes Father   . Hypertension Father   .  Hypertension Sister    PE: BP 130/80 (BP Location: Left Arm, Patient Position: Sitting)   Pulse 90   Ht '5\' 6"'$  (1.676 m)   Wt 245 lb (111.1 kg)   SpO2 96%   BMI 39.54 kg/m  Body mass index is 39.54 kg/m.  Wt Readings from Last 3 Encounters:  04/20/16 245 lb (111.1 kg)  02/12/16 247 lb (112 kg)  01/22/16 246 lb (  111.6 kg)   Constitutional: overweight, in NAD Eyes: PERRLA, EOMI, no exophthalmos ENT: moist mucous membranes, no thyromegaly, no cervical lymphadenopathy Cardiovascular: RRR, No MRG, no LE edema Respiratory: CTA B Gastrointestinal: abdomen soft, NT, ND, BS+ Musculoskeletal: no deformities, strength intact in all 4 Skin: moist, warm Neurological: no tremor with outstretched hands, DTR normal in all 4  ASSESSMENT: 1. DM2, non-insulin-dependent, uncontrolled, without long term complications, but with hyperglycemia  PLAN:  1. Patient with long-standing, uncontrolled diabetes, on oral antidiabetic regimen + Injectable GLP1 R agonist, with slightly worse  Control c/w last visit, especially in the morning. The sugars later in the day are at goal. We tried to move all Metformin at night, but this upset her stomach. Since she refused insulin, we started a GLP1 R agonist (Victoza). If at next visit her sugars in the morning are still high, I may suggest a motor Victoza at night to see if this makes a difference, although this is not ideal. - I suggested to:  Patient Instructions  Please continue: - Glipizide XL 10 mg in am + 1/2 a Glipizide XL tablet (5 mg) before a larger meal or if you have dessert. - Invokana 100 mg daily in am - Metformin XR 1000 mg 2x a day - Victoza 1.8 mg daily in a.m.  Please return in 3 month with your sugar log.   - continue checking sugars at different times of the day - check 2 times a day, rotating checks - advised for yearly eye exams >> UTD but needs one soon - checked HbA1c now: HbA1c 7.7% (higher) - Return to clinic in 3 mo with sugar  log   Philemon Kingdom, MD PhD [facility] Endocrinology

## 2016-04-20 NOTE — Patient Instructions (Signed)
Please continue: - Glipizide XL 10 mg in am + 1/2 a Glipizide XL tablet (5 mg) before a larger meal or if you have dessert. - Invokana 100 mg daily in am - Metformin XR 1000 mg 2x a day - Victoza 1.8 mg daily in a.m.  Please return in 3 month with your sugar log.

## 2016-04-26 ENCOUNTER — Other Ambulatory Visit: Payer: Self-pay | Admitting: Internal Medicine

## 2016-04-26 MED FILL — METFORMIN HCL ER 500 MG TAB: 500 | 30 days supply | Qty: 120 | Fill #0

## 2016-04-26 MED FILL — MICROLET LANCETS: 50 days supply | Qty: 100 | Fill #2

## 2016-04-26 MED FILL — INVOKANA 100 MG TABLET: 100 | 30 days supply | Qty: 30 | Fill #2

## 2016-04-26 MED FILL — UNIFINE PENTIPS 32GX5/32: 32G X 4 MM | 90 days supply | Qty: 100 | Fill #0

## 2016-04-26 MED FILL — UNIFINE PENTIPS 32GX5/32": 32G X 4 MM | 90 days supply | Qty: 100 | Fill #0

## 2016-04-26 MED FILL — VICTOZA 18 MG/3 ML INJECT P: 18 | 30 days supply | Qty: 9 | Fill #2

## 2016-05-14 MED FILL — VENTOLIN HFA 90 MCG INHALER: 108 (90 BAS | 75 days supply | Qty: 54 | Fill #2

## 2016-05-14 MED FILL — ALPRAZolam 0.25 MG TABS: 0.25 | 30 days supply | Qty: 30 | Fill #1

## 2016-05-20 MED FILL — METFORMIN HCL ER 500 MG TAB: 500 | 30 days supply | Qty: 120 | Fill #1

## 2016-05-20 MED FILL — VICTOZA 18 MG/3 ML INJECT P: 18 | 30 days supply | Qty: 9 | Fill #3

## 2016-05-31 MED FILL — glipiZIDE XL 10 MG TB24: 10 | 90 days supply | Qty: 90 | Fill #1

## 2016-06-11 MED FILL — ALPRAZolam 0.25 MG TABS: 0.25 | 30 days supply | Qty: 30 | Fill #2

## 2016-06-15 ENCOUNTER — Other Ambulatory Visit: Payer: Self-pay | Admitting: Internal Medicine

## 2016-06-15 MED FILL — LOSARTAN POTASSIUM 50 MG TA: 50 | 90 days supply | Qty: 90 | Fill #1

## 2016-06-15 MED FILL — PANTOPRAZOLE SOD DR 40 MG T: 40 | 90 days supply | Qty: 90 | Fill #1

## 2016-06-15 MED FILL — INVOKANA 100 MG TABLET: 100 | 30 days supply | Qty: 30 | Fill #0

## 2016-06-15 MED FILL — SIMVASTATIN 10 MG TABLET: 10 | 90 days supply | Qty: 90 | Fill #1

## 2016-06-15 MED FILL — METFORMIN HCL ER 500 MG TAB: 500 | 30 days supply | Qty: 120 | Fill #2

## 2016-06-15 MED FILL — VICTOZA 18 MG/3 ML INJECT P: 18 | 30 days supply | Qty: 9 | Fill #0

## 2016-07-09 MED FILL — INVOKANA 100 MG TABLET: 100 | 30 days supply | Qty: 30 | Fill #1

## 2016-07-12 MED FILL — VICTOZA 18 MG/3 ML INJECT P: 18 | 30 days supply | Qty: 9 | Fill #1

## 2016-07-19 MED FILL — UNIFINE PENTIPS 32GX5/32": 32G X 4 MM | 90 days supply | Qty: 100 | Fill #1

## 2016-07-19 MED FILL — UNIFINE PENTIPS 32GX5/32: 32G X 4 MM | 90 days supply | Qty: 100 | Fill #1

## 2016-08-04 ENCOUNTER — Other Ambulatory Visit: Payer: Self-pay | Admitting: Internal Medicine

## 2016-08-04 MED FILL — METFORMIN HCL ER 500 MG TAB: 500 | 30 days supply | Qty: 120 | Fill #0

## 2016-08-04 MED FILL — INVOKANA 100 MG TABLET: 100 | 30 days supply | Qty: 30 | Fill #2

## 2016-08-04 MED FILL — ALPRAZolam 0.25 MG TABS: 0.25 | 30 days supply | Qty: 30 | Fill #3

## 2016-08-06 ENCOUNTER — Telehealth: Payer: Self-pay

## 2016-08-06 ENCOUNTER — Ambulatory Visit (INDEPENDENT_AMBULATORY_CARE_PROVIDER_SITE_OTHER): Payer: 59 | Admitting: Internal Medicine

## 2016-08-06 ENCOUNTER — Encounter: Payer: Self-pay | Admitting: Internal Medicine

## 2016-08-06 VITALS — BP 120/64 | HR 107 | Ht 66.0 in | Wt 239.0 lb

## 2016-08-06 DIAGNOSIS — E1165 Type 2 diabetes mellitus with hyperglycemia: Secondary | ICD-10-CM

## 2016-08-06 DIAGNOSIS — IMO0001 Reserved for inherently not codable concepts without codable children: Secondary | ICD-10-CM

## 2016-08-06 DIAGNOSIS — Z6838 Body mass index (BMI) 38.0-38.9, adult: Secondary | ICD-10-CM | POA: Diagnosis not present

## 2016-08-06 DIAGNOSIS — E669 Obesity, unspecified: Secondary | ICD-10-CM | POA: Diagnosis not present

## 2016-08-06 MED ORDER — GLIPIZIDE ER 5 MG PO TB24: 5.0000 mg | ORAL_TABLET | Freq: Two times a day (BID) | ORAL | 3 refills | Status: DC

## 2016-08-06 MED FILL — glipiZIDE ER 5 MG TB24: 5 | 90 days supply | Qty: 180 | Fill #0

## 2016-08-06 MED FILL — VICTOZA 18 MG/3 ML INJECT P: 18 | 30 days supply | Qty: 9 | Fill #2

## 2016-08-06 NOTE — Patient Instructions (Addendum)
Please continue: - Invokana 100 mg daily in am - Metformin XR 500 mg 2x a day - Victoza 1.8 mg daily in a.m.  Please decrease Glipizide to: -  Glipizide XL 5 mg before b'fast + 5 mg (crushed) before a larger meal or if you have dessert.  Please return in 3 month with your sugar log.

## 2016-08-06 NOTE — Progress Notes (Signed)
Patient ID: Ashley Gilmore, female   DOB: October 21, 1965, 51 y.o.   MRN: [id number]  HPI: Ashley Gilmore is a 51 y.o.-year-old female, returning for f/u for DM2, dx in GDM in 1995 and 1997, then DM2 in ~2010, non-insulin-dependent, uncontrolled, without complications. Last visit 4 months ago.   She continues in the [employer]   Since last visit, she started to change her diet (eat less fatty foods and less red meat) and her sugars have improved. She also lost 6 pounds since last visit by our scale.  Last hemoglobin A1c was: Lab Results  Component Value Date   HGBA1C 7.7 04/20/2016   HGBA1C 6.9 12/05/2015   HGBA1C 6.8 07/29/2015  04/30/2014: HbA1c 7.5%  Pt is on a regimen of: - Glipizide XL 10 mg in am + 1/2 tab before a large meal (ocassionally) - Metformin XR 500 mg with b'fast and 500 mg with dinner -  (higher doses >> diarrhea) - Invokana 100 mg daily in am - started 02/2014 - Victoza 1.8 mg daily - started 04/2015 She tried regular Metformin when prediabetic >> severe diarrhea.  Pt checks her sugars 0-1x a da: - am:  112-161, 191, 204 >> 136-201 >> 128-201, 236 >> 97, 118-154, 170 - 2h after b'fast: n/c >> 110 >> n/c >> 184  - before lunch:  108-160 >> ? >> 78-97 >> 103-130, 240 >> 107, 122 >> 111-128 - 2h after lunch: n/c >> 157 >> n/c >> 131 >> 163 >> n/c - before dinner:131-171, 239 >> 69, 78-184, 240 >> 79, 85-106 >> 57, 67-88, 101 - 2h after dinner: n/c >> 88, 89 >> n/c - bedtime: 101-200 >> 200s >> 107-172 >> 96, 121-248, 276 >> 94-117 >> 63, 117-140, 180 - nighttime: n/c >> 70-130 No lows. Lowest sugar was 79 >> 67; she has hypoglycemia awareness at 70.  Highest sugar was  236 >> 170.  Glucometer:  [employer] Next  Pt's meals are: - Breakfast: protein bar - mid-am snack: apple + PB - Lunch: sandwich or salad - Dinner: meat + green veggie + starch - Snacks: 2 fruit, [name], rarely sweet tea  - No CKD, last BUN/creatinine:  Lab Results   Component Value Date   BUN 12 12/10/2015   CREATININE 0.73 12/10/2015  On Losartan - last set of lipids: Lab Results  Component Value Date   CHOL 200 12/10/2015   HDL 44.00 12/10/2015   LDLCALC 128 (H) 12/10/2015   TRIG 140.0 12/10/2015   CHOLHDL 5 12/10/2015  She takes Zocor 10 mg every other day because of muscle pain. - last eye exam was in 05/2015. Reportedly no DR.  - She denies numbness and tingling in her feet.  ROS: Constitutional: + weight loss, no fatigue, no subjective hyperthermia, no subjective hypothermia Eyes: no blurry vision, no xerophthalmia ENT: no sore throat, no nodules palpated in throat, no dysphagia, no odynophagia, no hoarseness Cardiovascular: no CP/no SOB/no palpitations/no leg swelling Respiratory: no cough/no SOB/no wheezing Gastrointestinal: no N/no V/no D/no C/no acid reflux Musculoskeletal: + muscle aches/+ joint aches Skin: no rashes, no hair loss Neurological: no tremors/no numbness/no tingling/no dizziness  I reviewed pt's medications, allergies, [name], social hx, family hx, [name], unchanged from my initial visit note.  Past Medical History:  Diagnosis Date  . Hyperlipidemia   . Insomnia   . Obesity    Past Surgical History:  Procedure Laterality Date  .  TUBAL LIGATION     History   Social History  . Marital Status: married    Spouse Name: N/A    Number of Children: 2   Occupational History  . Music therapist   Social History Main Topics  . Smoking status: Current Every Day Smoker -- 0.5 packs/day    Types: Cigarettes  . Smokeless tobacco: Never Used  . Alcohol Use: No  . Drug Use: No   Current Outpatient Prescriptions on File Prior to Visit  Medication Sig Dispense Refill  . ALPRAZolam (XANAX) 0.25 MG tablet TAKE 1/2 TO 1 TABLET BY MOUTH DAILY AS NEEDED FOR ANXIETY 30 tablet 5  . b complex vitamins tablet Take 1 tablet by mouth daily.    Marland Kitchen BAYER MICROLET  LANCETS lancets Use to test blood sugar 2 times daily as instructed. Dx: E11.65 100 each 5  . cetirizine (ZYRTEC) 10 MG tablet Take 10 mg by mouth daily.      . cholecalciferol (VITAMIN D) 1000 UNITS tablet Take 1,000 Units by mouth daily.     Marland Kitchen GLIPIZIDE XL 10 MG 24 hr tablet TAKE 1 TABLET BY MOUTH ONCE DAILY 90 tablet 1  . glucose blood (ACCU-CHEK GUIDE) test strip Use as instructed to check sugar 2 times daily 200 each 5  . INVOKANA 100 MG TABS tablet TAKE 1 TABLET BY MOUTH ONCE DAILY 30 tablet 2  . losartan (COZAAR) 50 MG tablet TAKE 1 TABLET BY MOUTH ONCE DAILY 90 tablet 1  . metFORMIN (GLUCOPHAGE-XR) 500 MG 24 hr tablet TAKE 2 TABLETS BY MOUTH TWICE DAILY WITH A MEAL 120 tablet 2  . Multiple Vitamins-Minerals (MULTIVITAMIN WITH MINERALS) tablet Take 1 tablet by mouth daily.    . pantoprazole (PROTONIX) 40 MG tablet TAKE 1 TABLET BY MOUTH ONCE DAILY 90 tablet 1  . Probiotic Product (CVS PROBIOTIC) CAPS Take 1 daily 60 capsule 2  . simvastatin (ZOCOR) 10 MG tablet TAKE 1 TABLET BY MOUTH AT BEDTIME 90 tablet 1  . UNIFINE PENTIPS 32G X 4 MM MISC USE ONCE DAILY AS DIRECTED 100 each 3  . VENTOLIN HFA 108 (90 Base) MCG/ACT inhaler USE 2 PUFFS BY MOUTH 4 TIMES DAILY 54 g 12  . VICTOZA 18 MG/3ML SOPN INJECT 1.8 MG UNDER SKIN DAILY IN A.M. AS DIRECTED 6 mL 5  . Blood Glucose Monitoring Suppl (BAYER CONTOUR NEXT MONITOR) w/Device KIT Use to test blood sugar daily. (Patient not taking: Reported on 08/06/2016) 1 kit 0  . buPROPion (WELLBUTRIN XL) 150 MG 24 hr tablet TAKE 1 TABLET BY MOUTH ONCE DAILY (Patient not taking: Reported on 01/22/2016) 90 tablet 1  . HYDROcodone-homatropine (HYCODAN) 5-1.5 MG/5ML syrup Take 5 mLs by mouth every 8 (eight) hours as needed for cough. (Patient not taking: Reported on 04/20/2016) 120 mL 0   No current facility-administered medications on file prior to visit.    Allergies  Allergen Reactions  . Metformin And Related Diarrhea  . Penicillins Rash   Family History   Problem Relation Age of Onset  . Diabetes Mother   . Hypertension Mother   . Diabetes Father   . Hypertension Father   . Hypertension Sister    PE: BP 120/64 (BP Location: Left Arm, Patient Position: Sitting)   Pulse (!) 107   Ht '5\' 6"'$  (1.676 m)   Wt 239 lb (108.4 kg)   SpO2 96%   BMI 38.58 kg/m  Body mass index is 38.58 kg/m.  Wt Readings from Last 3 Encounters:  08/06/16 239 lb (  108.4 kg)  04/20/16 245 lb (111.1 kg)  02/12/16 247 lb (112 kg)   Constitutional: overweight, in NAD Eyes: PERRLA, EOMI, no exophthalmos ENT: moist mucous membranes, no thyromegaly, no cervical lymphadenopathy Cardiovascular: RRR, No MRG Respiratory: CTA B Gastrointestinal: abdomen soft, NT, ND, BS+ Musculoskeletal: no deformities, strength intact in all 4 Skin: moist, warm, no rashes Neurological: no tremor with outstretched hands, DTR normal in all 4  ASSESSMENT: 1. DM2, non-insulin-dependent, uncontrolled, without long term complications, but with am hyperglycemia   BMI Classification:  < 18.5 underweight   18.5-24.9 normal weight   25.0-29.9 overweight   30.0-34.9 class I obesity   35.0-39.9 class II obesity   ? 40.0 class III obesity   PLAN:  1. Patient with long-standing, Uncontrolled diabetes, on oral antidiabetic regimen and injectable GLP 1 receptor agonist, with improving control. Her sugars are overall lower, with still slightly high CBGs in the morning, however, they decreased throughout the day. In fact, she developed hypoglycemia before dinner, so at this visit, we'll decrease her a.m. glipizide. I advised her to let me know if she has any more low blood sugars, and in that case, we need to decrease it even more or even stop. -  Her high a.m. sugars are likely caused by Dawn phenomenon but they continued to improve after the change in diet. Would not change the rest of her medicines for now.   - I suggested to:  Patient Instructions  Please continue: - Invokana 100 mg  daily in am - Metformin XR 500 mg 2x a day - Victoza 1.8 mg daily in a.m.  Please decrease Glipizide to: -  Glipizide XL 5 mg before b'fast + 5 mg (crushed) before a larger meal or if you have dessert.  Please return in 3 month with your sugar log.   - We'll check a new HbA1c in 2 days >> orders placed by PCP - I will obtain the results from PCP after they are resulted  - continue checking sugars at different times of the day - check 1x a day, rotating checks - advised for yearly eye exams >> she needs a new one  - return to clinic in 3 mo with sugar log   2. Obesity class 2 - I congratulated the patient regarding her change in diet and subsequent improvement in sugars and her weight. I strongly advised her to continue   Philemon Kingdom, MD PhD [facility] Endocrinology

## 2016-08-06 NOTE — Telephone Encounter (Signed)
Please advise. I contacted [employer] because they had sent [place] patients recent sugar log and I could not see it. They emailed me and told me that it was now under the Media tab date is 07/26/16. They scanned them in and wanted you to look over them on if any changes needed to be made.   Thank you!

## 2016-08-09 ENCOUNTER — Other Ambulatory Visit: Payer: 59 | Admitting: Internal Medicine

## 2016-08-09 DIAGNOSIS — E1165 Type 2 diabetes mellitus with hyperglycemia: Secondary | ICD-10-CM

## 2016-08-09 DIAGNOSIS — E8881 Metabolic syndrome: Secondary | ICD-10-CM

## 2016-08-09 DIAGNOSIS — E785 Hyperlipidemia, unspecified: Secondary | ICD-10-CM | POA: Diagnosis not present

## 2016-08-09 LAB — HEPATIC FUNCTION PANEL
ALT: 25 U/L (ref 6–29)
AST: 16 U/L (ref 10–35)
Albumin: 3.9 g/dL (ref 3.6–5.1)
Alkaline Phosphatase: 110 U/L (ref 33–130)
Bilirubin, Direct: 0.1 mg/dL (ref <=0.2)
Indirect Bilirubin: 0.4 mg/dL (ref 0.2–1.2)
Total Bilirubin: 0.5 mg/dL (ref 0.2–1.2)
Total Protein: 6.4 g/dL (ref 6.1–8.1)

## 2016-08-09 LAB — LIPID PANEL
Cholesterol: 187 mg/dL (ref <200)
HDL: 45 mg/dL — ABNORMAL LOW (ref >50)
LDL Cholesterol: 118 mg/dL — ABNORMAL HIGH (ref <100)
Total CHOL/HDL Ratio: 4.2 Ratio (ref <5.0)
Triglycerides: 118 mg/dL (ref <150)
VLDL: 24 mg/dL (ref <30)

## 2016-08-10 LAB — HEMOGLOBIN A1C
Hgb A1c MFr Bld: 6.4 % — ABNORMAL HIGH (ref <5.7)
Mean Plasma Glucose: 137 mg/dL

## 2016-08-13 ENCOUNTER — Ambulatory Visit (INDEPENDENT_AMBULATORY_CARE_PROVIDER_SITE_OTHER): Payer: 59 | Admitting: Internal Medicine

## 2016-08-13 ENCOUNTER — Encounter: Payer: Self-pay | Admitting: Internal Medicine

## 2016-08-13 VITALS — BP 120/70 | HR 88 | Temp 98.5°F | Wt 242.0 lb

## 2016-08-13 DIAGNOSIS — Z87891 Personal history of nicotine dependence: Secondary | ICD-10-CM | POA: Diagnosis not present

## 2016-08-13 DIAGNOSIS — E7800 Pure hypercholesterolemia, unspecified: Secondary | ICD-10-CM | POA: Diagnosis not present

## 2016-08-13 DIAGNOSIS — Z6839 Body mass index (BMI) 39.0-39.9, adult: Secondary | ICD-10-CM

## 2016-08-13 DIAGNOSIS — K219 Gastro-esophageal reflux disease without esophagitis: Secondary | ICD-10-CM

## 2016-08-13 DIAGNOSIS — E119 Type 2 diabetes mellitus without complications: Secondary | ICD-10-CM

## 2016-08-13 DIAGNOSIS — E8881 Metabolic syndrome: Secondary | ICD-10-CM

## 2016-08-13 DIAGNOSIS — I10 Essential (primary) hypertension: Secondary | ICD-10-CM | POA: Diagnosis not present

## 2016-08-13 NOTE — Progress Notes (Signed)
   Subjective:    Patient ID: Ashley Gilmore, female    DOB: 1965-12-18, 51 y.o.   MRN: [id number]  HPI  51 year old Female in today for follow-up of type 2 diabetes mellitus, GE reflux, obesity, insomnia, migraine headaches and asthma. Continues to smoke about 5 cigarettes daily. Is followed by endocrinologist, Dr. De Blanch. Recent hemoglobin A1c 6.4% in 3 months ago was 7.7%. LDL cholesterol is 118 and 8 months ago was 128.  She is on low-dose Zocor 10 mg daily, Protonix for GE reflux, Cozaar 50 mg daily. She is off  Invokana. She is on Victoza and glipizide 5 mg twice daily.  She takes Xanax sparingly for anxiety.  One of her daughters recently graduated from [employer] and the other one is a Therapist, art there.     Review of Systems see above     Objective:   Physical Exam  Neck is supple without JVD thyromegaly or carotid bruits. Chest clear. Cardiac exam regular rate and rhythm normal S1 and S2. Extremities without edema.      Assessment & Plan:  GE reflux-treated with PPI  Controlled type 2 diabetes mellitus-A1c has improved  Hyperlipidemia-stable on low-dose statin therapy  Anxiety depression treated with Xanax and Wellbutrin  Obesity-is enrolled in hospital wellness program  History of smoking-reminded once again to quit  History of migraine headaches  Allergic rhinitis treated with Zyrtec  Plan: Continue same medications and return in 6 months. Keep up the good work with control of diabetes.

## 2016-08-14 NOTE — Patient Instructions (Signed)
It was a pleasure to see you today. Keep up the good work with controlling diabetes and lipids. Return in 6 months. Please stop smoking. Consider seeing Dr. Leafy Ro for obesity.

## 2016-08-27 MED FILL — ACCU-CHEK GUIDE TEST STRIP: 90 days supply | Qty: 200 | Fill #1

## 2016-08-28 MED FILL — METFORMIN HCL ER 500 MG TAB: 500 | 30 days supply | Qty: 120 | Fill #1

## 2016-08-31 MED FILL — VICTOZA 18 MG/3 ML INJECT P: 18 | 30 days supply | Qty: 9 | Fill #3

## 2016-09-21 MED FILL — METFORMIN HCL ER 500 MG TAB: 500 | 30 days supply | Qty: 120 | Fill #2

## 2016-09-24 ENCOUNTER — Other Ambulatory Visit: Payer: Self-pay | Admitting: Internal Medicine

## 2016-09-24 MED FILL — SIMVASTATIN 10 MG TAB: 10 | 90 days supply | Qty: 90 | Fill #0

## 2016-09-24 MED FILL — LOSARTAN POTASSIUM 50 MG TA: 50 | 90 days supply | Qty: 90 | Fill #0

## 2016-09-24 MED FILL — ALPRAZolam 0.25 MG TABS: 0.25 | 30 days supply | Qty: 30 | Fill #0

## 2016-09-24 NOTE — Telephone Encounter (Signed)
Refill x 6 months 

## 2016-09-27 ENCOUNTER — Other Ambulatory Visit: Payer: Self-pay | Admitting: Internal Medicine

## 2016-09-27 MED FILL — VICTOZA 18 MG/3 ML INJECT P: 18 | 30 days supply | Qty: 9 | Fill #0

## 2016-10-11 MED FILL — UNIFINE PENTIPS 32GX5/32": 32G X 4 MM | 90 days supply | Qty: 100 | Fill #2

## 2016-10-11 MED FILL — UNIFINE PENTIPS 32GX5/32: 32G X 4 MM | 90 days supply | Qty: 100 | Fill #2

## 2016-10-21 MED FILL — VICTOZA 18 MG/3 ML INJECT P: 18 | 30 days supply | Qty: 9 | Fill #1

## 2016-11-08 ENCOUNTER — Ambulatory Visit: Payer: 59 | Admitting: Internal Medicine

## 2016-11-09 ENCOUNTER — Other Ambulatory Visit: Payer: Self-pay | Admitting: Internal Medicine

## 2016-11-09 MED FILL — glipiZIDE ER 5 MG TB24: 5 | 90 days supply | Qty: 180 | Fill #1

## 2016-11-09 MED FILL — METFORMIN HCL ER 500 MG TAB: 500 | 30 days supply | Qty: 120 | Fill #0

## 2016-11-09 MED FILL — ALPRAZolam 0.25 MG TABS: 0.25 | 30 days supply | Qty: 30 | Fill #1

## 2016-11-15 MED FILL — VICTOZA 18 MG/3 ML INJECT P: 18 | 30 days supply | Qty: 9 | Fill #2

## 2016-11-25 ENCOUNTER — Encounter: Payer: Self-pay | Admitting: Internal Medicine

## 2016-11-25 ENCOUNTER — Ambulatory Visit: Payer: 59 | Admitting: Internal Medicine

## 2016-11-25 VITALS — BP 140/80 | HR 87 | Wt 245.0 lb

## 2016-11-25 DIAGNOSIS — Z6838 Body mass index (BMI) 38.0-38.9, adult: Secondary | ICD-10-CM

## 2016-11-25 DIAGNOSIS — E1165 Type 2 diabetes mellitus with hyperglycemia: Secondary | ICD-10-CM | POA: Diagnosis not present

## 2016-11-25 LAB — POCT GLYCOSYLATED HEMOGLOBIN (HGB A1C): Hemoglobin A1C: 6.3

## 2016-11-25 NOTE — Progress Notes (Signed)
Patient ID: Ashley Gilmore, female   DOB: Apr 21, 1965, 51 y.o.   MRN: [id number]  HPI: Verneda Hollopeter Vonbehren is a 51 y.o.-year-old female, returning for f/u for DM2, dx in GDM in 1995 and 1997, then DM2 in ~2010, non-insulin-dependent, uncontrolled, without complications. Last visit 3.5 months ago.   Last hemoglobin A1c was: Lab Results  Component Value Date   HGBA1C 6.4 (H) 08/09/2016   HGBA1C 7.7 04/20/2016   HGBA1C 6.9 12/05/2015  04/30/2014: HbA1c 7.5%  Pt is on a regimen of: - Glipizide XL 10 mg in am + 1/2 tab before a large meal (ocassionally) >> 5 mg 2x a day (the tab before dinner: crushed) - Metformin XR 500 mg with b'fast and dinner -  (higher doses >> diarrhea) - Victoza 1.8 mg daily - started 04/2015 Stopped Invokana 100 mg daily in am b/c of an yeast inf- started 02/2014  She tried regular Metformin when prediabetic >> severe diarrhea.  Pt checks her sugars 0-1x a day: - am: 128-201, 236 >> 97, 118-154, 170 >> 99-154, 182 - 2h after b'fast: n/c >> 110 >> n/c >> 184   - before lunch:  107, 122 >> 111-128 >> n/c - 2h after lunch:131 >> 163 >> n/c - before dinner: 79, 85-106 >> 57, 67-88, 101 >> 64-111 - 2h after dinner: n/c >> 88, 89 >> n/c - bedtime:  94-117 >> 63, 117-140, 180 >> 111-152, 161 - nighttime: n/c >> 70-130 >> n/c Lowest sugar was 79 >> 67 >> 60; she has hypoglycemia awareness at 70.  Highest sugar was  236 >> 170 >> 182  Glucometer:  Bayer Contour Next  Pt's meals are: - Breakfast: protein bar - mid-am snack: apple + PB - Lunch: sandwich or salad - Dinner: meat + green veggie + starch - Snacks: 2 fruit, [name], rarely sweet tea  - No CKD, last BUN/creatinine:  Lab Results  Component Value Date   BUN 12 12/10/2015   CREATININE 0.73 12/10/2015  On Losartan - last set of lipids: Lab Results  Component Value Date   CHOL 187 08/09/2016   HDL 45 (L) 08/09/2016   LDLCALC 118 (H) 08/09/2016   TRIG 118 08/09/2016   CHOLHDL 4.2 08/09/2016   On Zocor qod 2/2 mm pain - last eye exam was in 10/2015 >> No DR - no numbness and tingling in her feet.  ROS: Constitutional: no weight gain/no weight loss, no fatigue, no subjective hyperthermia, no subjective hypothermia Eyes: no blurry vision, no xerophthalmia ENT: no sore throat, no nodules palpated in throat, no dysphagia, no odynophagia, no hoarseness Cardiovascular: no CP/no SOB/no palpitations/no leg swelling Respiratory: no cough/no SOB/no wheezing Gastrointestinal: no N/no V/no D/no C/no acid reflux Musculoskeletal: + muscle aches/+ joint aches Skin: no rashes, no hair loss Neurological: no tremors/no numbness/no tingling/no dizziness  I reviewed pt's medications, allergies, [name], social hx, family hx, [name], unchanged from my initial visit note.  Past Medical History:  Diagnosis Date  . Hyperlipidemia   . Insomnia   . Obesity    Past Surgical History:  Procedure Laterality Date  . TUBAL LIGATION     History   Social History  . Marital Status: married    Spouse Name: N/A    Number of Children: 2   Occupational History  . Music therapist   Social History Main Topics  . Smoking status: Current Every Day Smoker -- 0.5 packs/day  Types: Cigarettes  . Smokeless tobacco: Never Used  . Alcohol Use: No  . Drug Use: No   Current Outpatient Medications on File Prior to Visit  Medication Sig Dispense Refill  . ALPRAZolam (XANAX) 0.25 MG tablet TAKE 1/2 TO 1 TABLET BY MOUTH ONCE DAILY AS NEEDED 30 tablet 5  . b complex vitamins tablet Take 1 tablet by mouth daily.    Marland Kitchen BAYER MICROLET LANCETS lancets Use to test blood sugar 2 times daily as instructed. Dx: E11.65 100 each 5  . Blood Glucose Monitoring Suppl (BAYER CONTOUR NEXT MONITOR) w/Device KIT Use to test blood sugar daily. 1 kit 0  . cetirizine (ZYRTEC) 10 MG tablet Take 10 mg by mouth daily.      . cholecalciferol (VITAMIN D) 1000 UNITS  tablet Take 1,000 Units by mouth daily.     Marland Kitchen glipiZIDE (GLUCOTROL XL) 5 MG 24 hr tablet Take 1 tablet (5 mg total) by mouth 2 (two) times daily before a meal. 180 tablet 3  . glucose blood (ACCU-CHEK GUIDE) test strip Use as instructed to check sugar 2 times daily 200 each 5  . INVOKANA 100 MG TABS tablet TAKE 1 TABLET BY MOUTH ONCE DAILY (Patient not taking: Reported on 08/13/2016) 30 tablet 2  . losartan (COZAAR) 50 MG tablet TAKE 1 TABLET BY MOUTH ONCE DAILY 90 tablet 1  . metFORMIN (GLUCOPHAGE-XR) 500 MG 24 hr tablet TAKE 2 TABLETS BY MOUTH TWICE DAILY WITH A MEAL 120 tablet 2  . pantoprazole (PROTONIX) 40 MG tablet TAKE 1 TABLET BY MOUTH ONCE DAILY 90 tablet 1  . Probiotic Product ([employer]S PROBIOTIC) CAPS Take 1 daily 60 capsule 2  . simvastatin (ZOCOR) 10 MG tablet TAKE 1 TABLET BY MOUTH AT BEDTIME 90 tablet 3  . UNIFINE PENTIPS 32G X 4 MM MISC USE ONCE DAILY AS DIRECTED 100 each 3  . VENTOLIN HFA 108 (90 Base) MCG/ACT inhaler USE 2 PUFFS BY MOUTH 4 TIMES DAILY (Patient not taking: Reported on 08/13/2016) 54 g 12  . VICTOZA 18 MG/3ML SOPN INJECT 1.8 MG UNDER SKIN DAILY IN THE MORNING AS DIRECTED 6 mL 5   No current facility-administered medications on file prior to visit.    Allergies  Allergen Reactions  . Metformin And Related Diarrhea  . Penicillins Rash   Family History  Problem Relation Age of Onset  . Diabetes Mother   . Hypertension Mother   . Diabetes Father   . Hypertension Father   . Hypertension Sister    PE: BP 140/80 (BP Location: Left Arm, Patient Position: Sitting)   Pulse 87   Wt 245 lb (111.1 kg)   SpO2 94%   BMI 39.54 kg/m  Body mass index is 39.54 kg/m.  Wt Readings from Last 3 Encounters:  11/25/16 245 lb (111.1 kg)  08/13/16 242 lb (109.8 kg)  08/06/16 239 lb (108.4 kg)   Constitutional: overweight, in NAD Eyes: PERRLA, EOMI, no exophthalmos ENT: moist mucous membranes, no thyromegaly, no cervical lymphadenopathy Cardiovascular: RRR, No  MRG Respiratory: CTA B Gastrointestinal: abdomen soft, NT, ND, BS+ Musculoskeletal: no deformities, strength intact in all 4 Skin: moist, warm, no rashes Neurological: no tremor with outstretched hands, DTR normal in all 4  ASSESSMENT: 1. DM2, non-insulin-dependent, uncontrolled, without long term complications, but with am hyperglycemia  2. Obesity class 2 BMI Classification:  < 18.5 underweight   18.5-24.9 normal weight   25.0-29.9 overweight   30.0-34.9 class I obesity   35.0-39.9 class II obesity   ? 40.0  class III obesity   PLAN:  1. Patient with long-standing, uncontrolled DM, on po and injectable DM regimen, but w/o insulin. At last visit, she had low CBGs before dinner >> we decreased Glipizide dose. Her sugars were still higher than target in am (Dawn phenomenon), but she was working on her diet >> we did not change regimen - at this visit, she tells me she stopped Invokana b/c of an yeast infection >> then did not restart as her sugars were not worse - reviewing her logs >> sugars are at goal later in the day (few mild lows), and still Hgly spikes in am, but likely related to dietary indiscretions at night >> no change needed - at next visit, after the Holidays, we may be able to stop the am Glipizide - I suggested to:  Patient Instructions  Please continue: - Metformin XR 500 mg 2x a day - Victoza 1.8 mg daily in a.m. - Glipizide XL 5 mg before b'fast and  5 mg (crushed) before a larger meal or if you have dessert.  You can stay off [place] for now.  Let me know if sugars decrease.  Please return in 4 months with your sugar log.   - today, HbA1c is 6.3% (slightly better) - continue checking sugars at different times of the day - check 1x a day, rotating checks - advised for yearly eye exams >> she is UTD - UTD with flu shot - Return to clinic in 4 mo with sugar log   2. Obesity class 2 - she did well on her diet before last visit >> lost 6 lbs, but  relaxed diet since then and had some stomach pbs >> PCP [facility]  Philemon Kingdom, MD PhD [facility] Endocrinology

## 2016-11-25 NOTE — Addendum Note (Signed)
Addended by: Caprice Beaver T on: 11/25/2016 04:52 PM   Modules accepted: Orders

## 2016-11-25 NOTE — Patient Instructions (Addendum)
Please continue: - Metformin XR 500 mg 2x a day - Victoza 1.8 mg daily in a.m. - Glipizide XL 5 mg before b'fast and  5 mg (crushed) before a larger meal or if you have dessert.  You can stay off [place] for now.  Let me know if sugars decrease.  Please return in 4 months with your sugar log.

## 2016-12-06 MED FILL — METFORMIN HCL ER 500 MG TAB: 500 | 30 days supply | Qty: 120 | Fill #1

## 2016-12-06 MED FILL — ACCU-CHEK GUIDE TEST STRIP: 90 days supply | Qty: 200 | Fill #2

## 2016-12-13 MED FILL — VICTOZA 18 MG/3 ML INJECT P: 18 | 30 days supply | Qty: 9 | Fill #3

## 2016-12-13 MED FILL — ALPRAZolam 0.25 MG TABS: 0.25 | 30 days supply | Qty: 30 | Fill #2

## 2016-12-14 ENCOUNTER — Encounter: Payer: Self-pay | Admitting: Internal Medicine

## 2016-12-14 DIAGNOSIS — E119 Type 2 diabetes mellitus without complications: Secondary | ICD-10-CM | POA: Diagnosis not present

## 2016-12-14 LAB — HM DIABETES EYE EXAM

## 2016-12-20 MED FILL — SIMVASTATIN 10 MG TAB: 10 | 90 days supply | Qty: 90 | Fill #1

## 2016-12-20 MED FILL — LOSARTAN POTASSIUM 50 MG TA: 50 | 90 days supply | Qty: 90 | Fill #1

## 2016-12-31 MED FILL — METFORMIN HCL ER 500 MG TAB: 500 | 30 days supply | Qty: 120 | Fill #2

## 2017-01-04 MED FILL — UNIFINE PENTIPS 32GX5/32: 32G X 4 MM | 90 days supply | Qty: 100 | Fill #3

## 2017-01-04 MED FILL — UNIFINE PENTIPS 32GX5/32": 32G X 4 MM | 90 days supply | Qty: 100 | Fill #3

## 2017-01-12 ENCOUNTER — Other Ambulatory Visit: Payer: Self-pay | Admitting: Internal Medicine

## 2017-01-12 MED FILL — VICTOZA 18 MG/3 ML INJECT P: 18 | 30 days supply | Qty: 9 | Fill #0

## 2017-02-07 MED FILL — VICTOZA 18 MG/3 ML INJECT P: 18 | 30 days supply | Qty: 9 | Fill #1

## 2017-02-07 MED FILL — glipiZIDE ER 5 MG TB24: 5 | 90 days supply | Qty: 180 | Fill #2

## 2017-02-14 ENCOUNTER — Encounter: Payer: 59 | Admitting: Internal Medicine

## 2017-03-01 ENCOUNTER — Other Ambulatory Visit: Payer: Self-pay | Admitting: Internal Medicine

## 2017-03-01 MED FILL — VENTOLIN HFA 90 MCG INHALER: 108 (90 BAS | 25 days supply | Qty: 18 | Fill #0

## 2017-03-01 MED FILL — ALPRAZolam 0.25 MG TABS: 0.25 | 30 days supply | Qty: 30 | Fill #3

## 2017-03-01 MED FILL — ACCU-CHEK GUIDE TEST STRIP: 90 days supply | Qty: 200 | Fill #3

## 2017-03-07 MED FILL — VICTOZA 18 MG/3 ML INJECT P: 18 | 30 days supply | Qty: 9 | Fill #2

## 2017-03-25 ENCOUNTER — Ambulatory Visit: Payer: 59 | Admitting: Internal Medicine

## 2017-03-30 ENCOUNTER — Other Ambulatory Visit: Payer: Self-pay | Admitting: Internal Medicine

## 2017-03-30 MED FILL — UNIFINE PENTIPS 32GX5/32: 32G X 4 MM | 90 days supply | Qty: 100 | Fill #0

## 2017-03-30 MED FILL — VENTOLIN HFA 90 MCG INHALER: 108 (90 BAS | 25 days supply | Qty: 18 | Fill #1

## 2017-03-30 MED FILL — METFORMIN HCL ER 500 MG TAB: 500 | 30 days supply | Qty: 120 | Fill #0

## 2017-03-30 MED FILL — UNIFINE PENTIPS 32GX5/32": 32G X 4 MM | 90 days supply | Qty: 100 | Fill #0

## 2017-03-31 MED FILL — VICTOZA 18 MG/3 ML INJECT P: 18 | 30 days supply | Qty: 9 | Fill #3

## 2017-04-05 ENCOUNTER — Other Ambulatory Visit: Payer: Self-pay | Admitting: Internal Medicine

## 2017-04-05 DIAGNOSIS — E8881 Metabolic syndrome: Secondary | ICD-10-CM

## 2017-04-05 DIAGNOSIS — I10 Essential (primary) hypertension: Secondary | ICD-10-CM

## 2017-04-05 DIAGNOSIS — Z8639 Personal history of other endocrine, nutritional and metabolic disease: Secondary | ICD-10-CM

## 2017-04-05 DIAGNOSIS — E7800 Pure hypercholesterolemia, unspecified: Secondary | ICD-10-CM

## 2017-04-05 DIAGNOSIS — Z1329 Encounter for screening for other suspected endocrine disorder: Secondary | ICD-10-CM

## 2017-04-05 DIAGNOSIS — E119 Type 2 diabetes mellitus without complications: Secondary | ICD-10-CM

## 2017-04-11 ENCOUNTER — Other Ambulatory Visit: Payer: 59 | Admitting: Internal Medicine

## 2017-04-11 ENCOUNTER — Ambulatory Visit (INDEPENDENT_AMBULATORY_CARE_PROVIDER_SITE_OTHER): Payer: 59 | Admitting: Internal Medicine

## 2017-04-11 ENCOUNTER — Encounter: Payer: Self-pay | Admitting: Internal Medicine

## 2017-04-11 VITALS — BP 130/78 | HR 84 | Ht 65.5 in | Wt 248.0 lb

## 2017-04-11 DIAGNOSIS — E7800 Pure hypercholesterolemia, unspecified: Secondary | ICD-10-CM

## 2017-04-11 DIAGNOSIS — Z6841 Body Mass Index (BMI) 40.0 and over, adult: Secondary | ICD-10-CM | POA: Diagnosis not present

## 2017-04-11 DIAGNOSIS — Z0000 Encounter for general adult medical examination without abnormal findings: Secondary | ICD-10-CM | POA: Diagnosis not present

## 2017-04-11 DIAGNOSIS — Z23 Encounter for immunization: Secondary | ICD-10-CM | POA: Diagnosis not present

## 2017-04-11 DIAGNOSIS — Z1329 Encounter for screening for other suspected endocrine disorder: Secondary | ICD-10-CM

## 2017-04-11 DIAGNOSIS — E8881 Metabolic syndrome: Secondary | ICD-10-CM

## 2017-04-11 DIAGNOSIS — Z8639 Personal history of other endocrine, nutritional and metabolic disease: Secondary | ICD-10-CM

## 2017-04-11 DIAGNOSIS — I10 Essential (primary) hypertension: Secondary | ICD-10-CM | POA: Diagnosis not present

## 2017-04-11 DIAGNOSIS — E119 Type 2 diabetes mellitus without complications: Secondary | ICD-10-CM | POA: Diagnosis not present

## 2017-04-11 LAB — POCT URINALYSIS DIPSTICK
Appearance: NORMAL
Bilirubin, UA: NEGATIVE
Blood, UA: NEGATIVE
Glucose, UA: NEGATIVE
Ketones, UA: NEGATIVE
Leukocytes, UA: NEGATIVE
Nitrite, UA: NEGATIVE
Odor: NORMAL
Protein, UA: NEGATIVE
Spec Grav, UA: 1.01 (ref 1.010–1.025)
Urobilinogen, UA: 0.2 E.U./dL
pH, UA: 6.5 (ref 5.0–8.0)

## 2017-04-11 MED ORDER — HYDROCODONE-HOMATROPINE 5-1.5 MG/5ML PO SYRP
5.0000 mL | ORAL_SOLUTION | Freq: Three times a day (TID) | ORAL | 0 refills | Status: DC | PRN
Start: 2017-04-11 — End: 2017-09-27

## 2017-04-11 NOTE — Progress Notes (Signed)
   Subjective:    Patient ID: Ashley Gilmore, female    DOB: Jan 27, 1965, 52 y.o.   MRN: [id number]  HPI 53 year old Female for health maintenance exam and evaluation of medical issues.  History of controlled type 2 diabetes mellitus, GE reflux, obesity, insomnia, migraine headaches and asthma.  Does not really exercise.  Works full-time.  Past medical history: Oral surgery 1976 at which time wisdom teeth were extracted x4.  Bilateral tubal ligation 2002.  She has had 2 pregnancies and miscarriages.  History of postpartum depression after birth of her children for 1 or 2 years.  History of seasonal allergic rhinitis.  She had gestational diabetes.  She is allergic to penicillin causes shortness of breath and a rash.  Getting over a cold and would like to have Hycodan to take at night for cough  Smokes 1/4 pack of cigarettes daily.  Social history: She is married.  2 daughters both of whom attended [employer].  One  graduated last year and the other one will graduate in May.  She does not consume alcohol.  Husband is a press man.  She works as a Clinical biochemist at [employer].  She completed 2 years of college.  Still sees Endocrinologist and takes metformin 500 mg bid, Victoza, and glipizide. No longer taking Invokanna.  Family history: Parents living.  Father with history of hypertension and diabetes.  Mother with history of hypertension and diabetes.  Her mother is blind.  One sister with history of hypertension.      Review of Systems  Respiratory: Negative.   Cardiovascular: Negative.   Gastrointestinal: Negative.   Genitourinary: Negative.   Musculoskeletal: Negative.   Psychiatric/Behavioral: Negative.        Objective:   Physical Exam  Constitutional: She is oriented to person, place, and time. She appears well-developed and well-nourished. No distress.  HENT:  Head: Normocephalic and atraumatic.  Right Ear: External ear normal.  Left Ear: External  ear normal.  Mouth/Throat: Oropharynx is clear and moist.  Eyes: Pupils are equal, round, and reactive to light. Conjunctivae and EOM are normal. Right eye exhibits no discharge. Left eye exhibits no discharge. No scleral icterus.  Neck: Neck supple. No JVD present. No thyromegaly present.  Cardiovascular: Normal rate, regular rhythm and normal heart sounds.  No murmur heard. Pulmonary/Chest: Effort normal and breath sounds normal. No respiratory distress. She has no wheezes. She has no rales. She exhibits no tenderness.  Abdominal: Soft. Bowel sounds are normal. She exhibits no distension and no mass. There is no tenderness. There is no rebound and no guarding.  Musculoskeletal: She exhibits no edema.  Lymphadenopathy:    She has no cervical adenopathy.  Neurological: She is alert and oriented to person, place, and time. She has normal reflexes. No cranial nerve deficit. Coordination normal.  Skin: Skin is warm and dry. No rash noted. She is not diaphoretic.  Psychiatric: She has a normal mood and affect. Her behavior is normal. Judgment and thought content normal.  Vitals reviewed.         Assessment & Plan:  Acute URI-resolving.  Take Hycodan sparingly at night for cough prescription written.  Controlled type 2 diabetes mellitus-stable and followed by endocrinologist  Essential hypertension-stable on current regimen  Metabolic syndrome  History of vitamin D deficiency  Hyperlipidemia-labs drawn and pending  BMI 40.64-diet and exercise discussed  History of smoking-discussed  Plan: Return in 6 months  Prevnar 13 given today

## 2017-04-11 NOTE — Patient Instructions (Addendum)
Labs drawn and pending.For cough, hycodan one tsp at bedtime.needs prevnar 13. Continue same meds RTC 6 months. Diet and exercise.

## 2017-04-12 LAB — CBC WITH DIFFERENTIAL/PLATELET
Basophils Absolute: 79 cells/uL (ref 0–200)
Basophils Relative: 1 %
Eosinophils Absolute: 332 cells/uL (ref 15–500)
Eosinophils Relative: 4.2 %
HCT: 49.1 % — ABNORMAL HIGH (ref 35.0–45.0)
Hemoglobin: 16.7 g/dL — ABNORMAL HIGH (ref 11.7–15.5)
Lymphs Abs: 2157 cells/uL (ref 850–3900)
MCH: 28.5 pg (ref 27.0–33.0)
MCHC: 34 g/dL (ref 32.0–36.0)
MCV: 83.9 fL (ref 80.0–100.0)
MPV: 10.2 fL (ref 7.5–12.5)
Monocytes Relative: 5.6 %
Neutro Abs: 4890 cells/uL (ref 1500–7800)
Neutrophils Relative %: 61.9 %
Platelets: 289 10*3/uL (ref 140–400)
RBC: 5.85 10*6/uL — ABNORMAL HIGH (ref 3.80–5.10)
RDW: 13 % (ref 11.0–15.0)
Total Lymphocyte: 27.3 %
WBC mixed population: 442 cells/uL (ref 200–950)
WBC: 7.9 10*3/uL (ref 3.8–10.8)

## 2017-04-12 LAB — COMPLETE METABOLIC PANEL WITH GFR
AG Ratio: 1.8 (calc) (ref 1.0–2.5)
ALT: 21 U/L (ref 6–29)
AST: 16 U/L (ref 10–35)
Albumin: 4.3 g/dL (ref 3.6–5.1)
Alkaline phosphatase (APISO): 109 U/L (ref 33–130)
BUN: 11 mg/dL (ref 7–25)
CO2: 33 mmol/L — ABNORMAL HIGH (ref 20–32)
Calcium: 9.4 mg/dL (ref 8.6–10.4)
Chloride: 105 mmol/L (ref 98–110)
Creat: 0.71 mg/dL (ref 0.50–1.05)
GFR, Est African American: 113 mL/min/{1.73_m2} (ref >=60)
GFR, Est Non African American: 97 mL/min/{1.73_m2} (ref >=60)
Globulin: 2.4 g/dL (calc) (ref 1.9–3.7)
Glucose, Bld: 160 mg/dL — ABNORMAL HIGH (ref 65–99)
Potassium: 4.6 mmol/L (ref 3.5–5.3)
Sodium: 140 mmol/L (ref 135–146)
Total Bilirubin: 0.6 mg/dL (ref 0.2–1.2)
Total Protein: 6.7 g/dL (ref 6.1–8.1)

## 2017-04-12 LAB — VITAMIN D 25 HYDROXY (VIT D DEFICIENCY, FRACTURES): Vit D, 25-Hydroxy: 22 ng/mL — ABNORMAL LOW (ref 30–100)

## 2017-04-12 LAB — LIPID PANEL
Cholesterol: 198 mg/dL (ref <200)
HDL: 48 mg/dL — ABNORMAL LOW (ref >50)
LDL Cholesterol (Calc): 124 mg/dL (calc) — ABNORMAL HIGH
Non-HDL Cholesterol (Calc): 150 mg/dL (calc) — ABNORMAL HIGH (ref <130)
Total CHOL/HDL Ratio: 4.1 (calc) (ref <5.0)
Triglycerides: 138 mg/dL (ref <150)

## 2017-04-12 LAB — HEMOGLOBIN A1C
Hgb A1c MFr Bld: 7 % of total Hgb — ABNORMAL HIGH (ref <5.7)
Mean Plasma Glucose: 154 (calc)
eAG (mmol/L): 8.5 (calc)

## 2017-04-12 LAB — MICROALBUMIN / CREATININE URINE RATIO
Creatinine, Urine: 15 mg/dL — ABNORMAL LOW (ref 20–275)
Microalb, Ur: <0.2 mg/dL

## 2017-04-12 LAB — TSH: TSH: 1.31 mIU/L

## 2017-04-27 MED FILL — METFORMIN HCL ER 500 MG TAB: 500 | 30 days supply | Qty: 120 | Fill #1

## 2017-05-03 ENCOUNTER — Other Ambulatory Visit: Payer: Self-pay | Admitting: Internal Medicine

## 2017-05-03 MED FILL — VICTOZA 18 MG/3 ML INJECT P: 18 | 90 days supply | Qty: 27 | Fill #0

## 2017-05-03 MED FILL — glipiZIDE ER 5 MG TB24: 5 | 90 days supply | Qty: 180 | Fill #3

## 2017-05-21 MED FILL — METFORMIN HCL ER 500 MG TAB: 500 | 30 days supply | Qty: 120 | Fill #2

## 2017-05-31 ENCOUNTER — Ambulatory Visit: Payer: 59 | Admitting: Internal Medicine

## 2017-06-01 ENCOUNTER — Telehealth: Payer: Self-pay | Admitting: Internal Medicine

## 2017-06-01 NOTE — Telephone Encounter (Signed)
I don't see where she has had an MMR titer/injection.

## 2017-06-01 NOTE — Telephone Encounter (Signed)
Merla Stocking Self [phone]  Cher called to see if she had MMR

## 2017-06-01 NOTE — Telephone Encounter (Signed)
Called patient and let her know that we did not see where she had a MMR titer

## 2017-06-24 ENCOUNTER — Other Ambulatory Visit: Payer: Self-pay | Admitting: Internal Medicine

## 2017-06-24 MED FILL — UNIFINE PENTIPS 32GX5/32": 32G X 4 MM | 90 days supply | Qty: 100 | Fill #1

## 2017-06-24 MED FILL — ACCU-CHEK GUIDE TEST STRIP: 90 days supply | Qty: 200 | Fill #0

## 2017-06-24 MED FILL — UNIFINE PENTIPS 32GX5/32: 32G X 4 MM | 90 days supply | Qty: 100 | Fill #1

## 2017-06-24 NOTE — Telephone Encounter (Signed)
Is this okay to refill? 

## 2017-06-24 NOTE — Telephone Encounter (Signed)
OK 

## 2017-07-07 ENCOUNTER — Other Ambulatory Visit: Payer: Self-pay | Admitting: Internal Medicine

## 2017-07-07 MED FILL — METFORMIN HCL ER 500 MG TAB: 500 | 90 days supply | Qty: 360 | Fill #0

## 2017-07-26 ENCOUNTER — Other Ambulatory Visit: Payer: Self-pay | Admitting: Internal Medicine

## 2017-07-26 MED FILL — VENTOLIN HFA 90 MCG INHALER: 108 (90 BAS | 25 days supply | Qty: 18 | Fill #2

## 2017-07-26 MED FILL — glipiZIDE ER 5 MG TB24: 5 | 90 days supply | Qty: 180 | Fill #0

## 2017-07-26 MED FILL — VICTOZA 18 MG/3 ML INJECT P: 18 | 30 days supply | Qty: 9 | Fill #1

## 2017-08-18 ENCOUNTER — Emergency Department (INDEPENDENT_AMBULATORY_CARE_PROVIDER_SITE_OTHER): Payer: 59

## 2017-08-18 ENCOUNTER — Emergency Department (INDEPENDENT_AMBULATORY_CARE_PROVIDER_SITE_OTHER)
Admission: EM | Admit: 2017-08-18 | Discharge: 2017-08-18 | Disposition: A | Discharge status: Home / Self Care | Payer: 59 | Source: Home / Self Care | Attending: Family Medicine | Admitting: Family Medicine

## 2017-08-18 ENCOUNTER — Other Ambulatory Visit: Payer: Self-pay

## 2017-08-18 DIAGNOSIS — M25562 Pain in left knee: Secondary | ICD-10-CM

## 2017-08-18 DIAGNOSIS — M1712 Unilateral primary osteoarthritis, left knee: Secondary | ICD-10-CM | POA: Diagnosis not present

## 2017-08-18 DIAGNOSIS — G8929 Other chronic pain: Secondary | ICD-10-CM

## 2017-08-18 MED ORDER — MELOXICAM 15 MG PO TABS: 15.0000 mg | ORAL_TABLET | Freq: Every day | ORAL | 0 refills | Status: DC

## 2017-08-18 MED FILL — MELOXICAM 15 MG TABLET: 15 | 14 days supply | Qty: 14 | Fill #0

## 2017-08-18 NOTE — Discharge Instructions (Signed)
°  Meloxicam (Mobic) is an antiinflammatory to help with pain and inflammation.  Do not take ibuprofen, Advil, Aleve, or any other medications that contain NSAIDs while taking meloxicam as this may cause stomach upset or even ulcers if taken in large amounts for an extended period of time.   Please follow up with Sports Medicine or a previously established Orthopedist for further evaluation and treatment.

## 2017-08-18 NOTE — ED Provider Notes (Signed)
Ashley Gilmore [facility]    CSN: [id number] Arrival date & time: 08/18/17  1115     History   Chief Complaint Chief Complaint  Patient presents with  . Knee Pain    HPI Ashley Gilmore is a 52 y.o. female.   HPI  Ashley Gilmore is a 52 y.o. female presenting to UC with c/o Left knee pain for about 2-3 days, gradually worsening with associated swelling. Pain is aching with occasional sharp shooting pain up and down her leg. No known injury but she is on her feet often at work. Pain is moderate in severity with walking but minimal while sitting. She has tried ibuprofen and ice with mild relief.    Past Medical History:  Diagnosis Date  . Hyperlipidemia   . Insomnia   . Obesity     Patient Active Problem List   Diagnosis Date Noted  . Type 2 diabetes mellitus with hyperglycemia ([place]) 04/29/2015  . Metabolic syndrome 24/09/7351  . GE reflux 09/19/2011  . Hyperlipidemia 04/11/2011  . History of smoking 04/11/2011  . Asthma 09/06/2010  . Allergic rhinitis 09/06/2010  . Insomnia   . History of migraine headaches   . Class 2 severe obesity with serious comorbidity and body mass index (BMI) of 38.0 to 38.9 in adult [facility])     Past Surgical History:  Procedure Laterality Date  . TUBAL LIGATION      OB History   None      Home Medications    Prior to Admission medications   Medication Sig Start Date End Date Taking? Authorizing Provider  ACCU-CHEK GUIDE test strip USE AS INSTRUCTED TO CHECK SUGAR 2 TIMES DAILY 06/24/17   Philemon Kingdom, MD  ALPRAZolam Duanne Moron) 0.25 MG tablet TAKE 1/2 TO 1 TABLET BY MOUTH ONCE DAILY AS NEEDED 09/24/16   Elby Showers, MD  b complex vitamins tablet Take 1 tablet by mouth daily.    [provider]  BAYER MICROLET LANCETS lancets Use to test blood sugar 2 times daily as instructed. Dx: E11.65 04/29/15   Philemon Kingdom, MD  Blood Glucose Monitoring Suppl (BAYER CONTOUR NEXT MONITOR) w/Device KIT Use to test blood sugar daily. 04/29/15    Philemon Kingdom, MD  cetirizine (ZYRTEC) 10 MG tablet Take 10 mg by mouth daily.      [provider]  cholecalciferol (VITAMIN D) 1000 UNITS tablet Take 1,000 Units by mouth daily.     [provider]  glipiZIDE (GLUCOTROL XL) 5 MG 24 hr tablet TAKE 1 TABLET BY MOUTH TWICE A DAY BEFORE A MEAL 07/26/17   Philemon Kingdom, MD  HYDROcodone-homatropine [facility]) 5-1.5 MG/5ML syrup Take 5 mLs by mouth every 8 (eight) hours as needed for cough. 04/11/17   Elby Showers, MD  losartan (COZAAR) 50 MG tablet TAKE 1 TABLET BY MOUTH ONCE DAILY 09/24/16   Elby Showers, MD  meloxicam (MOBIC) 15 MG tablet Take 1 tablet (15 mg total) by mouth daily. 08/18/17   Noe Gens, PA-C  metFORMIN (GLUCOPHAGE-XR) 500 MG 24 hr tablet TAKE 2 TABLETS BY MOUTH TWICE DAILY WITH A MEAL 07/07/17   Philemon Kingdom, MD  pantoprazole (PROTONIX) 40 MG tablet TAKE 1 TABLET BY MOUTH ONCE DAILY 03/24/16   Elby Showers, MD  Probiotic Product (CVS PROBIOTIC) CAPS Take 1 daily 03/11/14   Philemon Kingdom, MD  simvastatin (ZOCOR) 10 MG tablet TAKE 1 TABLET BY MOUTH AT BEDTIME 09/24/16   Baxley, Cresenciano Lick, MD  UNIFINE PENTIPS 32G X  4 MM MISC USE ONCE DAILY AS DIRECTED 03/30/17   Philemon Kingdom, MD  VENTOLIN HFA 108 [phone] Base) MCG/ACT inhaler INHALE 2 PUFFS BY MOUTH FOUR TIMES DAILY 03/01/17   Elby Showers, MD  VICTOZA 18 MG/3ML SOPN INJECT 1.8 MG UNDER SKIN DAILY IN THE MORNING AS DIRECTED 05/03/17   Philemon Kingdom, MD    Family History Family History  Problem Relation Age of Onset  . Diabetes Mother   . Hypertension Mother   . Diabetes Father   . Hypertension Father   . Hypertension Sister     Social History Social History   Tobacco Use  . Smoking status: Current Every Day Smoker    Packs/day: 0.25    Types: Cigarettes  . Smokeless tobacco: Never Used  Substance Use Topics  . Alcohol use: No  . Drug use: No     Allergies   Metformin and related and Penicillins   Review of Systems Review of  Systems  Musculoskeletal: Positive for arthralgias, joint swelling and myalgias.  Skin: Negative for color change and wound.  Neurological: Negative for weakness and numbness.     Physical Exam Triage Vital Signs ED Triage Vitals  Enc Vitals Group     BP 08/18/17 1149 131/77     Pulse Rate 08/18/17 1149 80     Resp --      Temp 08/18/17 1149 97.6 F (36.4 C)     Temp Source 08/18/17 1149 Oral     SpO2 08/18/17 1149 97 %     Weight 08/18/17 1150 248 lb (112.5 kg)     Height 08/18/17 1150 _0  (1.702 m)     Head Circumference --      Peak Flow --      Pain Score 08/18/17 1150 7     Pain Loc --      Pain Edu? --      Excl. in [place]? --    No data found.  Updated Vital Signs BP 131/77 (BP Location: Right Arm)   Pulse 80   Temp 97.6 F (36.4 C) (Oral)   Ht _1  (1.702 m)   Wt 248 lb (112.5 kg)   SpO2 97%   BMI 38.84 kg/m   Visual Acuity Right Eye Distance:   Left Eye Distance:   Bilateral Distance:    Right Eye Near:   Left Eye Near:    Bilateral Near:     Physical Exam  Constitutional: She is oriented to person, place, and time. She appears well-developed and well-nourished. No distress.  HENT:  Head: Normocephalic and atraumatic.  Eyes: EOM are normal.  Neck: Normal range of motion.  Cardiovascular: Normal rate.  Pulmonary/Chest: Effort normal.  Musculoskeletal: Normal range of motion. She exhibits edema and tenderness.  Left knee: no obvious edema. Full ROM w/o crepitus. Increased pain with full knee flexion. No focal tenderness.  Calf is soft, non-tender.  Neurological: She is alert and oriented to person, place, and time.  Skin: Skin is warm and dry. She is not diaphoretic.  Left knee: skin in tact. No ecchymosis or erythema.   Psychiatric: She has a normal mood and affect. Her behavior is normal.  Nursing note and vitals reviewed.    UC Treatments / Results  Labs (all labs ordered are listed, but only abnormal results are displayed) Labs Reviewed  - No data to display  EKG None  Radiology Dg Knee Complete 4 Views Left  Result Date: 08/18/2017 CLINICAL DATA:  Chronic LEFT knee  pain, awoke yesterday with increased pain and swelling EXAM: LEFT KNEE - COMPLETE 4+ VIEW COMPARISON:  None FINDINGS: Low normal osseous mineralization. Joint space narrowing and spur formation greatest at patellofemoral joint. No acute fracture, dislocation, or bone destruction. Probable bone island proximal tibia. No knee joint effusion. IMPRESSION: Osteoarthritic changes LEFT knee greatest at patellofemoral joint. Electronically Signed   By: Lavonia Dana M.D.   On: 08/18/2017 12:19    Procedures Procedures (including critical care time)  Medications Ordered in UC Medications - No data to display  Initial Impression / Assessment and Plan / UC Course  I have reviewed the triage vital signs and the nursing notes.  Pertinent labs & imaging results that were available during my care of the patient were reviewed by me and considered in my medical decision making (see chart for details).     Discussed imaging with pt Knee sleeve provided for comfort Home care instructions provided below.   Final Clinical Impressions(s) / UC Diagnoses   Final diagnoses:  Left knee pain  Osteoarthritis of left knee, unspecified osteoarthritis type     Discharge Instructions      Meloxicam (Mobic) is an antiinflammatory to help with pain and inflammation.  Do not take ibuprofen, Advil, Aleve, or any other medications that contain NSAIDs while taking meloxicam as this may cause stomach upset or even ulcers if taken in large amounts for an extended period of time.   Please follow up with Sports Medicine or a previously established Orthopedist for further evaluation and treatment.     ED Prescriptions    Medication Sig Dispense Auth. Provider   meloxicam (MOBIC) 15 MG tablet Take 1 tablet (15 mg total) by mouth daily. 14 tablet Noe Gens, [place]     Controlled  Substance Prescriptions [place] Controlled Substance Registry consulted? Not Applicable   Tyrell Antonio 08/18/17 1244

## 2017-08-18 NOTE — ED Triage Notes (Signed)
Pt had knee pain Tuesday, yesterday increased, and swelled.

## 2017-09-12 ENCOUNTER — Other Ambulatory Visit: Payer: Self-pay | Admitting: Internal Medicine

## 2017-09-26 ENCOUNTER — Ambulatory Visit: Payer: 59 | Admitting: Internal Medicine

## 2017-09-26 ENCOUNTER — Encounter: Payer: Self-pay | Admitting: Internal Medicine

## 2017-09-26 VITALS — BP 140/80 | HR 100 | Temp 99.3°F | Ht 65.5 in | Wt 250.0 lb

## 2017-09-26 DIAGNOSIS — K047 Periapical abscess without sinus: Secondary | ICD-10-CM | POA: Diagnosis not present

## 2017-09-26 MED ORDER — DOXYCYCLINE HYCLATE 100 MG PO TABS: 100.0000 mg | ORAL_TABLET | Freq: Two times a day (BID) | ORAL | 0 refills | Status: DC

## 2017-09-26 MED FILL — DOXYCYCLINE HYCLATE 100 MG: 100 | 10 days supply | Qty: 20 | Fill #0

## 2017-09-26 NOTE — Progress Notes (Signed)
   Subjective:    Patient ID: Helene Kelp Risdon, female    DOB: November 25, 1965, 52 y.o.   MRN: [id number]  HPI 52 year old Female in today complaining of left infraorbital pressure and swelling.  Thinks she may have a sinus infection.  However she subsequently showed me what appears to be an abscess left maxillary gingival area.  Has not seen a dentist in 2 years.  Patient has adult onset diabetes mellitus.  No fever or chills.  Has malaise and fatigue.    Review of Systems     Objective:   Physical Exam She has an abscess above her left maxillary bicuspid.  Pharynx is clear.  Neck is supple.  Chest clear.  No facial erythema and minimal left infraorbital swelling.         Assessment & Plan:  Dental abscess  Plan: Doxycycline 100 mg twice daily for 10 days.  Patient must call dentist immediately.

## 2017-09-27 ENCOUNTER — Ambulatory Visit: Payer: 59 | Admitting: Internal Medicine

## 2017-09-27 ENCOUNTER — Encounter: Payer: Self-pay | Admitting: Internal Medicine

## 2017-09-27 VITALS — BP 154/80 | HR 93 | Resp 16 | Ht 65.5 in | Wt 250.0 lb

## 2017-09-27 DIAGNOSIS — E1165 Type 2 diabetes mellitus with hyperglycemia: Secondary | ICD-10-CM

## 2017-09-27 DIAGNOSIS — Z6838 Body mass index (BMI) 38.0-38.9, adult: Secondary | ICD-10-CM | POA: Diagnosis not present

## 2017-09-27 LAB — POCT GLYCOSYLATED HEMOGLOBIN (HGB A1C): Hemoglobin A1C: 7.5 % — AB (ref 4.0–5.6)

## 2017-09-27 NOTE — Patient Instructions (Signed)
Doxycycline 100 mg twice daily for 10 days.  Patient must call dentist immediately.

## 2017-09-27 NOTE — Progress Notes (Signed)
Patient ID: Ashley Gilmore, female   DOB: 30-Jul-1965, 52 y.o.   MRN: [id number]  HPI: Ashley Gilmore is a 52 y.o.-year-old female, returning for f/u for DM2, dx in GDM in 1995 and 1997, then DM2 in ~2010, non-insulin-dependent, uncontrolled, without complications. Last visit 10 mo ago.  Since last visit, she had a lot of stress in her life: Mom dx'ed with BrCA. Daughter was also sick-viral infection. She has L Knee OA and had a lot of pain since last visit.  She also has an abscessed tooth currently and is on antibiotics.  Sugars have been higher.  However, she feels that things are now more under control and she expects them to decrease in the next few months.  Last hemoglobin A1c was: Lab Results  Component Value Date   HGBA1C 7.0 (H) 04/11/2017   HGBA1C 6.3 11/25/2016   HGBA1C 6.4 (H) 08/09/2016  04/30/2014: HbA1c 7.5%  Pt is on a regimen of: - Glipizide XL 10 mg in am + 1/2 tab before a large meal (ocassionally) >> 5 mg 2x a day (the tab before dinner: crushed) - Metformin XR 500 mg with b'fast and dinner (higher doses gave her diarrhea) - Victoza 1.8 mg daily - started 04/2015 Stopped Invokana 100 mg daily in am b/c of an yeast inf- started 02/2014  She tried regular Metformin when prediabetic >> severe diarrhea.  Pt checks her sugars once a day-per review of her log: - am:  97, 118-154, 170 >> 99-154, 182 >> 138-172, 180 - 2h after b'fast: n/c >> 110 >> n/c >> 184  >> n/c - before lunch:  107, 122 >> 111-128 >> n/c - 2h after lunch:131 >> 163 >> n/c - before dinner:  57, 67-88, 101 >> 64-111 >> 62-128, 160 - 2h after dinner: n/c >> 88, 89 >> n/c >> 88-172 - bedtime:  63, 117-140, 180 >> 111-152, 161 >> 140, 171 - nighttime: n/c >> 70-130 >> n/c Lowest sugar was 60 >> 62; she has hypoglycemia awareness in the 70s Highest sugar was  182 >> 180.  Glucometer:  [employer] Next  Pt's meals are: - Breakfast: protein bar - mid-am snack: apple + PB - Lunch: sandwich or salad - Dinner:  meat + green veggie + starch - Snacks: 2 fruit, [name], rarely sweet tea  - No CKD, last BUN/creatinine:  Lab Results  Component Value Date   BUN 11 04/11/2017   CREATININE 0.71 04/11/2017  On Losartan - + HL; last set of lipids: Lab Results  Component Value Date   CHOL 198 04/11/2017   HDL 48 (L) 04/11/2017   LDLCALC 124 (H) 04/11/2017   TRIG 138 04/11/2017   CHOLHDL 4.1 04/11/2017  On Zocor >> but + mm cramps. - last eye exam was in Fall 2018: No DR - no numbness and tingling in her feet.  ROS: Constitutional: no weight gain/no weight loss, no fatigue, no subjective hyperthermia, no subjective hypothermia Eyes: no blurry vision, no xerophthalmia ENT: no sore throat, no nodules palpated in throat, no dysphagia, no odynophagia, no hoarseness Cardiovascular: no CP/no SOB/no palpitations/no leg swelling Respiratory: no cough/no SOB/no wheezing Gastrointestinal: no N/no V/no D/no C/no acid reflux Musculoskeletal: + muscle aches/no joint aches Skin: no rashes, no hair loss Neurological: no tremors/no numbness/no tingling/no dizziness  I reviewed pt's medications, allergies, [name], social hx, family hx, [name], unchanged from my initial visit note.  Past  Medical History:  Diagnosis Date  . Hyperlipidemia   . Insomnia   . Obesity    Past Surgical History:  Procedure Laterality Date  . TUBAL LIGATION     History   Social History  . Marital Status: married    Spouse Name: N/A    Number of Children: 2   Occupational History  . Music therapist   Social History Main Topics  . Smoking status: Current Every Day Smoker -- 0.5 packs/day    Types: Cigarettes  . Smokeless tobacco: Never Used  . Alcohol Use: No  . Drug Use: No   Current Outpatient Medications on File Prior to Visit  Medication Sig Dispense Refill  . ACCU-CHEK GUIDE test strip USE AS INSTRUCTED TO CHECK SUGAR 2 TIMES  DAILY 200 each 5  . ALPRAZolam (XANAX) 0.25 MG tablet TAKE 1/2 TO 1 TABLET BY MOUTH ONCE DAILY AS NEEDED 30 tablet 5  . b complex vitamins tablet Take 1 tablet by mouth daily.    Marland Kitchen BAYER MICROLET LANCETS lancets Use to test blood sugar 2 times daily as instructed. Dx: E11.65 100 each 5  . Blood Glucose Monitoring Suppl (BAYER CONTOUR NEXT MONITOR) w/Device KIT Use to test blood sugar daily. 1 kit 0  . cetirizine (ZYRTEC) 10 MG tablet Take 10 mg by mouth daily.      . cholecalciferol (VITAMIN D) 1000 UNITS tablet Take 1,000 Units by mouth daily.     Marland Kitchen doxycycline (VIBRA-TABS) 100 MG tablet Take 1 tablet (100 mg total) by mouth 2 (two) times daily. 20 tablet 0  . glipiZIDE (GLUCOTROL XL) 5 MG 24 hr tablet TAKE 1 TABLET BY MOUTH TWICE A DAY BEFORE A MEAL 180 tablet 3  . HYDROcodone-homatropine (HYCODAN) 5-1.5 MG/5ML syrup Take 5 mLs by mouth every 8 (eight) hours as needed for cough. 120 mL 0  . losartan (COZAAR) 50 MG tablet TAKE 1 TABLET BY MOUTH ONCE DAILY 90 tablet 1  . meloxicam (MOBIC) 15 MG tablet Take 1 tablet (15 mg total) by mouth daily. 14 tablet 0  . metFORMIN (GLUCOPHAGE-XR) 500 MG 24 hr tablet TAKE 2 TABLETS BY MOUTH TWICE DAILY WITH A MEAL 360 tablet 0  . pantoprazole (PROTONIX) 40 MG tablet TAKE 1 TABLET BY MOUTH ONCE DAILY 90 tablet 1  . Probiotic Product ([employer]S PROBIOTIC) CAPS Take 1 daily 60 capsule 2  . simvastatin (ZOCOR) 10 MG tablet TAKE 1 TABLET BY MOUTH AT BEDTIME 90 tablet 3  . UNIFINE PENTIPS 32G X 4 MM MISC USE ONCE DAILY AS DIRECTED 100 each 3  . VENTOLIN HFA 108 (90 Base) MCG/ACT inhaler INHALE 2 PUFFS BY MOUTH FOUR TIMES DAILY 54 g 12  . VICTOZA 18 MG/3ML SOPN INJECT 1.8 MG UNDER SKIN DAILY IN THE MORNING AS DIRECTED 6 mL 5   No current facility-administered medications on file prior to visit.    Allergies  Allergen Reactions  . Metformin And Related Diarrhea  . Penicillins Rash   Family History  Problem Relation Age of Onset  . Diabetes Mother   . Hypertension  Mother   . Diabetes Father   . Hypertension Father   . Hypertension Sister    PE: BP (!) 154/80   Pulse 93   Resp 16   Ht 5' 5.5" (1.664 m)   Wt 250 lb (113.4 kg)   SpO2 93%   BMI 40.97 kg/m  Body mass index is 40.97 kg/m.  Wt Readings from Last 3 Encounters:  09/27/17 250 lb (113.4 kg)  09/26/17 250 lb (113.4 kg)  08/18/17 248 lb (112.5 kg)   Constitutional: overweight, in NAD Eyes: PERRLA, EOMI, no exophthalmos ENT: moist mucous membranes, no thyromegaly, no cervical lymphadenopathy Cardiovascular: tachycardia, RR, No MRG Respiratory: CTA B Gastrointestinal: abdomen soft, NT, ND, BS+ Musculoskeletal: no deformities, strength intact in all 4 Skin: moist, warm, no rashes Neurological: no tremor with outstretched hands, DTR normal in all 4  ASSESSMENT: 1. DM2, non-insulin-dependent, uncontrolled, without long term complications, but with am hyperglycemia  2. Obesity class 2 BMI Classification:  < 18.5 underweight   18.5-24.9 normal weight   25.0-29.9 overweight   30.0-34.9 class I obesity   35.0-39.9 class II obesity   ? 40.0 class III obesity   3. HL  PLAN:  1. Patient with standing, uncontrolled, type 2 diabetes, on oral diabetes regimen and also GLP-1 receptor agonist.  She was previously on Invokana, but she stopped due to yeast infection.  At last visit, sugars did not worsen after stopping Invokana, and as her HbA1c was at goal, at 6.3%, we continue off the [place].  She had higher blood sugars in the morning (dawn phenomenon) but she was planning to start working on her diet, so we did not change her regimen.  However, since last visit, she had an HbA1c of 7.0 in 03/2017.  She now returns after a longer absence, of 10 months. - At this visit, sugars are higher due to increased stress and other medical issues since last visit.  However, she feels that she is not doing better and feels that her sugars are most likely going to improve in the next 3 months.   She would like to not add any new medicine at this point.  We discussed that if the sugars do not improve, we may need to add Invokana back.  I advised her to send me sugars in 1 month and we may need to start this if they are not improved.  She agrees. - I suggested to:  Patient Instructions  Please continue: - Metformin XR 500 mg 2x a day - Victoza 1.8 mg daily in a.m. - Glipizide XL 5 mg before b'fast and 5 mg (crushed) before dinner.  Please let me know if the sugars do not improve in ~1 month.  Please return in 3 months with your sugar log  - today, HbA1c is 7.5% (higher) - continue checking sugars at different times of the day - check 1x a day, rotating checks - advised for yearly eye exams >> she is UTD - Return to clinic in 4 mo with sugar log    2. Obesity class 2 - Continues to gain weight - PCP [facility] >> did not start - continue GLP1 R agonist which should help with wt loss  3. HL - Reviewed latest lipid panel from 03/2017: LDL high, HDL slightly low Lab Results  Component Value Date   CHOL 198 04/11/2017   HDL 48 (L) 04/11/2017   LDLCALC 124 (H) 04/11/2017   TRIG 138 04/11/2017   CHOLHDL 4.1 04/11/2017  - Continues Zocor.    Philemon Kingdom, MD PhD [facility]

## 2017-09-27 NOTE — Patient Instructions (Signed)
Please continue: - Metformin XR 500 mg 2x a day - Victoza 1.8 mg daily in a.m. - Glipizide XL 5 mg before b'fast and 5 mg (crushed) before dinner.  Please let me know if the sugars do not improve in ~1 month.  Please return in 3 months with your sugar log

## 2017-09-30 MED FILL — HYDROCODON-APAP 5-325: 5-325 | 3 days supply | Qty: 16 | Fill #0

## 2017-10-03 ENCOUNTER — Other Ambulatory Visit: Payer: Self-pay | Admitting: Internal Medicine

## 2017-10-03 MED FILL — UNIFINE PENTIPS 32GX5/32: 32G X 4 MM | 90 days supply | Qty: 100 | Fill #2

## 2017-10-03 MED FILL — VICTOZA 18 MG/3 ML INJECT P: 18 | 30 days supply | Qty: 9 | Fill #0

## 2017-10-03 MED FILL — UNIFINE PENTIPS 32GX5/32": 32G X 4 MM | 90 days supply | Qty: 100 | Fill #2

## 2017-10-03 MED FILL — ACCU-CHEK GUIDE TEST STRIP: 90 days supply | Qty: 200 | Fill #1

## 2017-10-03 MED FILL — METFORMIN HCL ER 500 MG TAB: 500 | 90 days supply | Qty: 360 | Fill #0

## 2017-10-05 MED FILL — CLINDAMYCIN HCL 150 MG CAPS: 150 | 7 days supply | Qty: 28 | Fill #0

## 2017-10-05 MED FILL — CHLORHEXIDINE 0.12% RINSE: 0.12 | 16 days supply | Qty: 473 | Fill #0

## 2017-10-10 ENCOUNTER — Other Ambulatory Visit: Payer: 59 | Admitting: Internal Medicine

## 2017-10-14 ENCOUNTER — Ambulatory Visit: Payer: 59 | Admitting: Internal Medicine

## 2017-10-17 MED FILL — glipiZIDE ER 5 MG TB24: 5 | 90 days supply | Qty: 180 | Fill #1

## 2017-10-26 ENCOUNTER — Other Ambulatory Visit: Payer: Self-pay | Admitting: Internal Medicine

## 2017-10-26 DIAGNOSIS — E7800 Pure hypercholesterolemia, unspecified: Secondary | ICD-10-CM

## 2017-10-26 DIAGNOSIS — Z79899 Other long term (current) drug therapy: Secondary | ICD-10-CM

## 2017-10-26 DIAGNOSIS — Z5181 Encounter for therapeutic drug level monitoring: Secondary | ICD-10-CM

## 2017-10-26 DIAGNOSIS — E119 Type 2 diabetes mellitus without complications: Secondary | ICD-10-CM

## 2017-10-27 MED FILL — VICTOZA 18 MG/3 ML INJECT P: 18 | 30 days supply | Qty: 9 | Fill #1

## 2017-10-27 MED FILL — VENTOLIN HFA 90 MCG INHALER: 108 (90 BAS | 25 days supply | Qty: 18 | Fill #3

## 2017-10-31 ENCOUNTER — Other Ambulatory Visit: Payer: 59 | Admitting: Internal Medicine

## 2017-10-31 DIAGNOSIS — Z5181 Encounter for therapeutic drug level monitoring: Secondary | ICD-10-CM

## 2017-10-31 DIAGNOSIS — E119 Type 2 diabetes mellitus without complications: Secondary | ICD-10-CM | POA: Diagnosis not present

## 2017-10-31 DIAGNOSIS — E7800 Pure hypercholesterolemia, unspecified: Secondary | ICD-10-CM | POA: Diagnosis not present

## 2017-10-31 DIAGNOSIS — Z79899 Other long term (current) drug therapy: Secondary | ICD-10-CM | POA: Diagnosis not present

## 2017-11-01 LAB — HEPATIC FUNCTION PANEL
AG Ratio: 1.8 (calc) (ref 1.0–2.5)
ALT: 22 U/L (ref 6–29)
AST: 14 U/L (ref 10–35)
Albumin: 4.2 g/dL (ref 3.6–5.1)
Alkaline phosphatase (APISO): 114 U/L (ref 33–130)
Bilirubin, Direct: 0.1 mg/dL (ref 0.0–0.2)
Globulin: 2.4 g/dL (calc) (ref 1.9–3.7)
Indirect Bilirubin: 0.5 mg/dL (calc) (ref 0.2–1.2)
Total Bilirubin: 0.6 mg/dL (ref 0.2–1.2)
Total Protein: 6.6 g/dL (ref 6.1–8.1)

## 2017-11-01 LAB — HEMOGLOBIN A1C
Hgb A1c MFr Bld: 7.4 % of total Hgb — ABNORMAL HIGH (ref <5.7)
Mean Plasma Glucose: 166 (calc)
eAG (mmol/L): 9.2 (calc)

## 2017-11-01 LAB — LIPID PANEL
Cholesterol: 215 mg/dL — ABNORMAL HIGH (ref <200)
HDL: 47 mg/dL — ABNORMAL LOW (ref >50)
LDL Cholesterol (Calc): 134 mg/dL (calc) — ABNORMAL HIGH
Non-HDL Cholesterol (Calc): 168 mg/dL (calc) — ABNORMAL HIGH (ref <130)
Total CHOL/HDL Ratio: 4.6 (calc) (ref <5.0)
Triglycerides: 197 mg/dL — ABNORMAL HIGH (ref <150)

## 2017-11-01 LAB — MICROALBUMIN / CREATININE URINE RATIO
Creatinine, Urine: 51 mg/dL (ref 20–275)
Microalb Creat Ratio: 4 mcg/mg creat (ref <30)
Microalb, Ur: 0.2 mg/dL

## 2017-11-03 ENCOUNTER — Ambulatory Visit (INDEPENDENT_AMBULATORY_CARE_PROVIDER_SITE_OTHER): Payer: 59 | Admitting: Internal Medicine

## 2017-11-03 ENCOUNTER — Encounter: Payer: Self-pay | Admitting: Internal Medicine

## 2017-11-03 VITALS — BP 130/70 | HR 86 | Temp 98.3°F | Ht 65.5 in | Wt 248.0 lb

## 2017-11-03 DIAGNOSIS — Z6841 Body Mass Index (BMI) 40.0 and over, adult: Secondary | ICD-10-CM | POA: Diagnosis not present

## 2017-11-03 DIAGNOSIS — E782 Mixed hyperlipidemia: Secondary | ICD-10-CM

## 2017-11-03 DIAGNOSIS — E8881 Metabolic syndrome: Secondary | ICD-10-CM

## 2017-11-03 DIAGNOSIS — Z87891 Personal history of nicotine dependence: Secondary | ICD-10-CM | POA: Diagnosis not present

## 2017-11-03 DIAGNOSIS — E119 Type 2 diabetes mellitus without complications: Secondary | ICD-10-CM | POA: Diagnosis not present

## 2017-11-03 DIAGNOSIS — I10 Essential (primary) hypertension: Secondary | ICD-10-CM | POA: Diagnosis not present

## 2017-11-03 DIAGNOSIS — F411 Generalized anxiety disorder: Secondary | ICD-10-CM

## 2017-11-03 DIAGNOSIS — F439 Reaction to severe stress, unspecified: Secondary | ICD-10-CM | POA: Diagnosis not present

## 2017-11-03 MED ORDER — LOSARTAN POTASSIUM 50 MG PO TABS: 50.0000 mg | ORAL_TABLET | Freq: Every day | ORAL | 1 refills | Status: DC

## 2017-11-03 MED ORDER — ROSUVASTATIN CALCIUM 5 MG PO TABS: ORAL_TABLET | ORAL | 3 refills | Status: DC

## 2017-11-03 MED FILL — LOSARTAN POTASSIUM 50 MG TA: 50 | 30 days supply | Qty: 30 | Fill #0

## 2017-11-03 MED FILL — ROSUVASTATIN CALCIUM 5 MG T: 5 | 84 days supply | Qty: 36 | Fill #0

## 2017-11-14 ENCOUNTER — Other Ambulatory Visit: Payer: Self-pay | Admitting: Internal Medicine

## 2017-11-14 ENCOUNTER — Encounter: Payer: Self-pay | Admitting: Internal Medicine

## 2017-11-14 MED ORDER — CANAGLIFLOZIN 100 MG PO TABS: 100.0000 mg | ORAL_TABLET | Freq: Every day | ORAL | 3 refills | Status: DC

## 2017-11-16 NOTE — Patient Instructions (Signed)
Please try to take good care of yourself.  Watch diet.  Try to get some exercise.  Continue same medications and follow-up in 6 months.  Please consider Dr. Migdalia Dk clinic.

## 2017-11-16 NOTE — Progress Notes (Signed)
   Subjective:    Patient ID: Ashley Gilmore, female    DOB: 22-Jun-1965, 52 y.o.   MRN: [id number]  HPI 52 year old Female in today for 57-month recheck on essential hypertension diabetes and hyperlipidemia.  She does see Dr. Maryellen Pile for endocrinology care.  There was a scare recently when her mother was diagnosed with breast cancer.  The entire family was very upset thinking it was a poor prognosis but as it turns out the prognosis is much more positive according to the patient.  She says she has not been taking good care of herself due to the stress.  Has been trying to help out her mother.  Unfortunately continues to smoke.  Does not really exercise.  Works full-time.  Labs discussed with her hemoglobin A1c 7.4%.  Total cholesterol 215 and was 198 7 months ago.  HDL is low at 47.  Triglycerides have increased from 138 to 97.  LDL has increased from 124 to 134.    Review of Systems no new complaints just anxiety from situational stress     Objective:   Physical Exam  Skin warm and dry.  Nodes none.  Neck is supple without JVD thyromegaly or carotid bruits.  Blood pressure 130/70.  Weight 248 pounds.  Pulse 86 regular.  BMI 40.64  Chest clear to auscultation.  Cardiac exam regular rate and rhythm.  Extremities without edema.      Assessment & Plan:  Patient would be an excellent candidate for Dr. Migdalia Dk clinic.  We have discussed this previously.  She will consider it but has a lot of stress  with mother being ill.  Prognosis is better than expected for her mother.  Patient needs to take better care of herself.  Encourage diet exercise and weight loss.  Follow-up in 6 months.  25 minutes spent with patient

## 2017-11-18 ENCOUNTER — Telehealth: Payer: Self-pay

## 2017-11-18 MED ORDER — EMPAGLIFLOZIN 25 MG PO TABS: 25.0000 mg | ORAL_TABLET | Freq: Every day | ORAL | 5 refills | Status: DC

## 2017-11-18 MED FILL — JARDIANCE 25 MG TABLET: 25 | 30 days supply | Qty: 30 | Fill #0

## 2017-11-18 NOTE — Addendum Note (Signed)
Addended by: Cardell Peach I on: 11/18/2017 10:41 AM   Modules accepted: Orders

## 2017-11-18 NOTE — Telephone Encounter (Signed)
Received fax that Anastasio Auerbach is not covered by insurance.  Covered meds are:  Januvia Janumet Tradjenta Lovie Macadamia

## 2017-11-18 NOTE — Telephone Encounter (Signed)
Let's send Jardiance 25 mg daily before breakfast #30 with 5 refills.

## 2017-11-18 NOTE — Telephone Encounter (Signed)
New RX sent, patient notified.

## 2017-11-22 MED FILL — VICTOZA 18 MG/3 ML INJECT P: 18 | 30 days supply | Qty: 9 | Fill #2

## 2017-11-28 MED FILL — LOSARTAN POTASSIUM 50 MG TA: 50 | 30 days supply | Qty: 30 | Fill #1

## 2017-12-05 ENCOUNTER — Other Ambulatory Visit: Payer: Self-pay | Admitting: Internal Medicine

## 2017-12-05 DIAGNOSIS — Z79899 Other long term (current) drug therapy: Secondary | ICD-10-CM

## 2017-12-05 DIAGNOSIS — E782 Mixed hyperlipidemia: Secondary | ICD-10-CM

## 2017-12-05 DIAGNOSIS — Z5181 Encounter for therapeutic drug level monitoring: Secondary | ICD-10-CM

## 2017-12-12 ENCOUNTER — Other Ambulatory Visit: Payer: 59 | Admitting: Internal Medicine

## 2017-12-12 MED FILL — JARDIANCE 25 MG TABLET: 25 | 30 days supply | Qty: 30 | Fill #1

## 2017-12-12 MED FILL — VENTOLIN HFA 90 MCG INHALER: 108 (90 BAS | 25 days supply | Qty: 18 | Fill #4

## 2017-12-14 ENCOUNTER — Other Ambulatory Visit: Payer: Self-pay | Admitting: Internal Medicine

## 2017-12-19 MED FILL — VICTOZA 18 MG/3 ML INJECT P: 18 | 30 days supply | Qty: 9 | Fill #3

## 2017-12-20 ENCOUNTER — Other Ambulatory Visit: Payer: 59 | Admitting: Internal Medicine

## 2017-12-20 DIAGNOSIS — Z5181 Encounter for therapeutic drug level monitoring: Secondary | ICD-10-CM

## 2017-12-20 DIAGNOSIS — Z79899 Other long term (current) drug therapy: Secondary | ICD-10-CM

## 2017-12-20 DIAGNOSIS — E782 Mixed hyperlipidemia: Secondary | ICD-10-CM

## 2017-12-20 LAB — LIPID PANEL
Cholesterol: 168 mg/dL (ref <200)
HDL: 48 mg/dL — ABNORMAL LOW (ref >50)
LDL Cholesterol (Calc): 98 mg/dL (calc)
Non-HDL Cholesterol (Calc): 120 mg/dL (calc) (ref <130)
Total CHOL/HDL Ratio: 3.5 (calc) (ref <5.0)
Triglycerides: 119 mg/dL (ref <150)

## 2017-12-20 LAB — HEPATIC FUNCTION PANEL
AG Ratio: 1.9 (calc) (ref 1.0–2.5)
ALT: 22 U/L (ref 6–29)
AST: 17 U/L (ref 10–35)
Albumin: 4.3 g/dL (ref 3.6–5.1)
Alkaline phosphatase (APISO): 108 U/L (ref 33–130)
Bilirubin, Direct: 0.1 mg/dL (ref 0.0–0.2)
Globulin: 2.3 g/dL (calc) (ref 1.9–3.7)
Indirect Bilirubin: 0.3 mg/dL (calc) (ref 0.2–1.2)
Total Bilirubin: 0.4 mg/dL (ref 0.2–1.2)
Total Protein: 6.6 g/dL (ref 6.1–8.1)

## 2017-12-22 MED FILL — LOSARTAN POTASSIUM 50 MG TA: 50 | 30 days supply | Qty: 30 | Fill #2

## 2018-01-03 ENCOUNTER — Other Ambulatory Visit: Payer: Self-pay | Admitting: Internal Medicine

## 2018-01-03 MED FILL — metFORMIN HCL ER 500 MG TB2: 500 | 90 days supply | Qty: 360 | Fill #0

## 2018-01-05 ENCOUNTER — Ambulatory Visit: Payer: 59 | Admitting: Internal Medicine

## 2018-01-13 ENCOUNTER — Other Ambulatory Visit: Payer: Self-pay | Admitting: Internal Medicine

## 2018-01-13 MED FILL — glipiZIDE ER 5 MG TB24: 5 | 90 days supply | Qty: 180 | Fill #2

## 2018-01-13 MED FILL — UNIFINE PENTIPS 32GX5/32: 32G X 4 MM | 90 days supply | Qty: 100 | Fill #3

## 2018-01-13 MED FILL — JARDIANCE 25 MG TABLET: 25 | 30 days supply | Qty: 30 | Fill #2

## 2018-01-13 MED FILL — VICTOZA 18 MG/3 ML INJECT P: 18 | 90 days supply | Qty: 27 | Fill #0

## 2018-01-13 MED FILL — VENTOLIN HFA 90 MCG INHALER: 108 (90 BAS | 25 days supply | Qty: 18 | Fill #5

## 2018-01-17 MED FILL — LOSARTAN POTASSIUM 50 MG TA: 50 | 30 days supply | Qty: 30 | Fill #3

## 2018-01-27 ENCOUNTER — Telehealth: Payer: Self-pay | Admitting: Internal Medicine

## 2018-01-27 MED ORDER — VARENICLINE TARTRATE 0.5 MG PO TABS: 0.5000 mg | ORAL_TABLET | Freq: Two times a day (BID) | ORAL | 0 refills | Status: DC

## 2018-01-27 MED FILL — CHANTIX STARTING MONTH BOX: 0.5 MG X 11 | 30 days supply | Qty: 53 | Fill #0

## 2018-01-27 NOTE — Telephone Encounter (Signed)
Patient is requesting a Rx for Chantix.    Pharmacy:  [facility]  Thank you.

## 2018-01-27 NOTE — Telephone Encounter (Signed)
Call in Chantix starter pack

## 2018-01-27 NOTE — Telephone Encounter (Signed)
Called in.

## 2018-01-30 MED FILL — ROSUVASTATIN CALCIUM 5 MG T: 5 | 84 days supply | Qty: 36 | Fill #1

## 2018-02-06 MED FILL — JARDIANCE 25 MG TABLET: 25 | 90 days supply | Qty: 90 | Fill #3

## 2018-02-06 MED FILL — VENTOLIN HFA 90 MCG INHALER: 108 (90 BAS | 25 days supply | Qty: 18 | Fill #6

## 2018-02-10 ENCOUNTER — Ambulatory Visit: Payer: 59 | Admitting: Internal Medicine

## 2018-02-27 MED FILL — LOSARTAN POTASSIUM 50 MG TA: 50 | 30 days supply | Qty: 30 | Fill #4

## 2018-02-28 ENCOUNTER — Ambulatory Visit: Payer: 59 | Admitting: Internal Medicine

## 2018-02-28 ENCOUNTER — Encounter: Payer: Self-pay | Admitting: Internal Medicine

## 2018-02-28 VITALS — BP 120/68 | HR 88 | Ht 65.5 in | Wt 246.0 lb

## 2018-02-28 DIAGNOSIS — E1165 Type 2 diabetes mellitus with hyperglycemia: Secondary | ICD-10-CM | POA: Diagnosis not present

## 2018-02-28 DIAGNOSIS — Z6838 Body mass index (BMI) 38.0-38.9, adult: Secondary | ICD-10-CM | POA: Diagnosis not present

## 2018-02-28 DIAGNOSIS — E7849 Other hyperlipidemia: Secondary | ICD-10-CM | POA: Diagnosis not present

## 2018-02-28 LAB — POCT GLYCOSYLATED HEMOGLOBIN (HGB A1C): Hemoglobin A1C: 7.4 % — AB (ref 4.0–5.6)

## 2018-02-28 MED ORDER — DULAGLUTIDE 1.5 MG/0.5ML ~~LOC~~ SOAJ: 1.5000 mg | SUBCUTANEOUS | 11 refills | Status: DC

## 2018-02-28 MED FILL — TRULICITY 1.5 MG/0.5 ML PEN: 1.5 | 28 days supply | Qty: 2 | Fill #0

## 2018-02-28 NOTE — Addendum Note (Signed)
Addended by: Cardell Peach I on: 02/28/2018 04:01 PM   Modules accepted: Orders

## 2018-02-28 NOTE — Progress Notes (Signed)
Patient ID: Ashley Gilmore, female   DOB: 1965-05-10, 53 y.o.   MRN: [id number]  HPI: Ashley Gilmore is a 53 y.o.-year-old female, returning for f/u for DM2, dx in GDM in 1995 and 1997, then DM2 in ~2010, non-insulin-dependent, uncontrolled, without long-term complications. Last visit 4 mo ago.  Last hemoglobin A1c was: Lab Results  Component Value Date   HGBA1C 7.4 (H) 10/31/2017   HGBA1C 7.5 (A) 09/27/2017   HGBA1C 7.0 (H) 04/11/2017  04/30/2014: HbA1c 7.5%  Pt is on a regimen of: - Metformin XR 500 mg 2x a day - Jardiance 25 mg before b'fast - Victoza 1.8 mg daily in a.m. - Glipizide XL 5 mg before b'fast and 5 mg (crushed) before dinner. Stopped Invokana 100 mg daily in am b/c of an yeast inf- started 02/2014  She tried regular Metformin when prediabetic >> severe diarrhea.  Pt checks her sugars once a day-we reviewed her log: - am:  99-154, 182 >> 138-172, 180 >> 129-166, 171 - 2h after b'fast:n/c >> 184  >> n/c - before lunch:  107, 122 >> 111-128 >> n/c - 2h after lunch:131 >> 163 >> n/c - before dinner:  64-111 >> 62-128, 160 >> 74-122 - 2h after dinner: 88, 89 >> n/c >> 88-172 >> 138 - bedtime: 111-152, 161 >> 140, 171 >> 128-157, 170 - nighttime: n/c >> 70-130 >> n/c Lowest sugar was 60 >> 62 >> 74; she has hypoglycemia awareness in the 70s. Highest sugar was  182 >> 180 >> 171.  Glucometer:  [employer] Next  Pt's meals are: - Breakfast: protein bar - mid-am snack: apple + PB - Lunch: sandwich or salad - Dinner: meat + green veggie + starch - Snacks: 2 fruit, [name], [name], last BUN/creatinine:  Lab Results  Component Value Date   BUN 11 04/11/2017   CREATININE 0.71 04/11/2017  On losartan. -+ HL; last set of lipids: Lab Results  Component Value Date   CHOL 168 12/20/2017   HDL 48 (L) 12/20/2017   LDLCALC 98 12/20/2017   TRIG 119 12/20/2017   CHOLHDL 3.5 12/20/2017  On Zocor.  She has muscle cramps with it. - last  eye exam was in fall 2018: No DR -No numbness and tingling in her feet.  She is trying to quit smoking >> on Chantix.  ROS: Constitutional: no weight gain/no weight loss, no fatigue, no subjective hyperthermia, no subjective hypothermia Eyes: no blurry vision, no xerophthalmia ENT: no sore throat, no nodules palpated in neck, no dysphagia, no odynophagia, no hoarseness Cardiovascular: no CP/no SOB/no palpitations/no leg swelling Respiratory: no cough/no SOB/no wheezing Gastrointestinal: + N/no V/no D/no C/+ acid reflux Musculoskeletal: + muscle aches/+ joint aches Skin: no rashes, no hair loss Neurological: no tremors/no numbness/no tingling/no dizziness  I reviewed pt's medications, allergies, [name], social hx, family hx, [name], unchanged from my initial visit note.  Past Medical History:  Diagnosis Date  . Hyperlipidemia   . Insomnia   . Obesity    Past Surgical History:  Procedure Laterality Date  . TUBAL LIGATION     History   Social History  . Marital Status: married    Spouse Name: N/A    Number of Children: 2   Occupational History  . Music therapist   Social History Main Topics  . Smoking status: Current Every Day Smoker -- 0.5 packs/day    Types:  Cigarettes  . Smokeless tobacco: Never Used  . Alcohol Use: No  . Drug Use: No   Current Outpatient Medications on File Prior to Visit  Medication Sig Dispense Refill  . ACCU-CHEK GUIDE test strip USE AS INSTRUCTED TO CHECK SUGAR 2 TIMES DAILY 200 each 5  . b complex vitamins tablet Take 1 tablet by mouth daily.    Marland Kitchen BAYER MICROLET LANCETS lancets Use to test blood sugar 2 times daily as instructed. Dx: E11.65 100 each 5  . cetirizine (ZYRTEC) 10 MG tablet Take 10 mg by mouth daily.      . cholecalciferol (VITAMIN D) 1000 UNITS tablet Take 1,000 Units by mouth daily.     . empagliflozin (JARDIANCE) 25 MG TABS tablet Take 25 mg by mouth daily before  breakfast. 30 tablet 5  . glipiZIDE (GLUCOTROL XL) 5 MG 24 hr tablet TAKE 1 TABLET BY MOUTH TWICE A DAY BEFORE A MEAL 180 tablet 3  . losartan (COZAAR) 50 MG tablet Take 1 tablet (50 mg total) by mouth daily. 90 tablet 1  . metFORMIN (GLUCOPHAGE-XR) 500 MG 24 hr tablet TAKE 2 TABLETS BY MOUTH TWICE DAILY WITH A MEAL 360 tablet 0  . pantoprazole (PROTONIX) 40 MG tablet TAKE 1 TABLET BY MOUTH ONCE DAILY 90 tablet 1  . Probiotic Product (CVS PROBIOTIC) CAPS Take 1 daily 60 capsule 2  . rosuvastatin (CRESTOR) 5 MG tablet One po 3 times a week 36 tablet 3  . UNIFINE PENTIPS 32G X 4 MM MISC USE ONCE DAILY AS DIRECTED 100 each 3  . varenicline (CHANTIX) 0.5 MG tablet Take 1 tablet (0.5 mg total) by mouth 2 (two) times daily. 60 tablet 0  . VENTOLIN HFA 108 (90 Base) MCG/ACT inhaler INHALE 2 PUFFS BY MOUTH FOUR TIMES DAILY 54 g 12  . VICTOZA 18 MG/3ML SOPN INJECT 1.8 MG UNDER SKIN DAILY IN THE MORNING AS DIRECTED 6 mL 5   No current facility-administered medications on file prior to visit.    Allergies  Allergen Reactions  . Metformin And Related Diarrhea  . Penicillins Rash   Family History  Problem Relation Age of Onset  . Diabetes Mother   . Hypertension Mother   . Diabetes Father   . Hypertension Father   . Hypertension Sister    PE: BP 120/68   Pulse 88   Ht 5' 5.5" (1.664 m)   Wt 246 lb (111.6 kg)   SpO2 98%   BMI 40.31 kg/m  Body mass index is 40.31 kg/m.  Wt Readings from Last 3 Encounters:  02/28/18 246 lb (111.6 kg)  11/03/17 248 lb (112.5 kg)  09/27/17 250 lb (113.4 kg)   Constitutional: overweight, in NAD Eyes: PERRLA, EOMI, no exophthalmos ENT: moist mucous membranes, no thyromegaly, no cervical lymphadenopathy Cardiovascular: RRR, No MRG Respiratory: CTA B Gastrointestinal: abdomen soft, NT, ND, BS+ Musculoskeletal: no deformities, strength intact in all 4 Skin: moist, warm, no rashes Neurological: no tremor with outstretched hands, DTR normal in all  4  ASSESSMENT: 1. DM2, non-insulin-dependent, uncontrolled, without long term complications, but with am hyperglycemia  2. Obesity class 2 BMI Classification:  < 18.5 underweight   18.5-24.9 normal weight   25.0-29.9 overweight   30.0-34.9 class I obesity   35.0-39.9 class II obesity   ? 40.0 class III obesity   3. HL  PLAN:  1. Patient with longstanding, uncontrolled, type 2 diabetes, on oral antidiabetic regimen and GLP-1 receptor agonist.  She was previously on Invokana but we had to  stop due to yeast infection.  At last visit, her sugars were higher and she did not want to have any other medicine.  However, they did not improve and she contacted me since then >> we started Jardiance. -At this visit, sugars are still high in the morning but they are almost all at goal later in the day. -We discussed about improving her dinner by reducing the portion and switching to a lower protein and carb dinner.  Also, exercise especially in the second half of the day can help with morning sugars.  Ultimately, we discussed about moving the entire dose of metformin with dinner.  She will try this. -We will also try to switch from Victoza to Trulicity for the ease of administration. - I suggested to:  Patient Instructions  Please continue: - Jardiance 25 mg before b'fast - Glipizide XL 5 mg before b'fast and 5 mg (crushed) before dinner.  Stop Victoza and start: - Trulicity  1.5 mg weekly - Sunday am  Please try to move: - Metformin ER 1000 mg with dinner  Please return in 3 months with your sugar log  - today, HbA1c is 7.4% (slightly lower) - continue checking sugars at different times of the day - check 1-2x a day, rotating checks - advised for yearly eye exams >> she is not UTD - Return to clinic in 3 mo with sugar log    2. Obesity class 2 -Lost 4 pounds since last visit -Continue GLP-1 receptor agonist and GLP-1 receptor inhibitor, which should also help with weight loss  3.  HL - Reviewed latest lipid panel from 12/2017: LDL lower than 100, HDL low low Lab Results  Component Value Date   CHOL 168 12/20/2017   HDL 48 (L) 12/20/2017   LDLCALC 98 12/20/2017   TRIG 119 12/20/2017   CHOLHDL 3.5 12/20/2017  - Continues Zocor >> muscle cramps.   Philemon Kingdom, MD PhD [facility]

## 2018-02-28 NOTE — Patient Instructions (Addendum)
Please continue: - Jardiance 25 mg before b'fast - Glipizide XL 5 mg before b'fast and 5 mg (crushed) before dinner.  Stop Victoza and start: - Trulicity  1.5 mg weekly - Sunday am  Please try to move: - Metformin ER 1000 mg with dinner  Please return in 3 months with your sugar log

## 2018-03-28 ENCOUNTER — Other Ambulatory Visit: Payer: Self-pay | Admitting: Internal Medicine

## 2018-03-28 MED FILL — metFORMIN HCL ER 500 MG TB2: 500 | 90 days supply | Qty: 360 | Fill #0

## 2018-03-28 MED FILL — TRULICITY 1.5 MG/0.5 ML PEN: 1.5 | 28 days supply | Qty: 2 | Fill #1

## 2018-04-03 ENCOUNTER — Other Ambulatory Visit: Payer: Self-pay | Admitting: Internal Medicine

## 2018-04-04 ENCOUNTER — Other Ambulatory Visit: Payer: Self-pay

## 2018-04-04 ENCOUNTER — Other Ambulatory Visit: Payer: Self-pay | Admitting: Internal Medicine

## 2018-04-04 MED FILL — ALBUTEROL SULFATE HFA 108 (: 108 (90 BAS | 30 days supply | Qty: 18 | Fill #0

## 2018-04-04 MED FILL — LOSARTAN POTASSIUM 50 MG TA: 50 | 90 days supply | Qty: 90 | Fill #0

## 2018-04-10 MED FILL — glipiZIDE ER 5 MG TB24: 5 | 90 days supply | Qty: 180 | Fill #3

## 2018-04-24 MED FILL — TRULICITY 1.5 MG/0.5 ML PEN: 1.5 | 28 days supply | Qty: 2 | Fill #2

## 2018-04-24 MED FILL — ROSUVASTATIN CALCIUM 5 MG T: 5 | 84 days supply | Qty: 36 | Fill #2

## 2018-05-16 ENCOUNTER — Other Ambulatory Visit: Payer: Self-pay | Admitting: Internal Medicine

## 2018-05-16 MED FILL — ALBUTEROL SULFATE HFA 108 (: 108 (90 BAS | 30 days supply | Qty: 18 | Fill #1

## 2018-05-16 MED FILL — JARDIANCE 25 MG TABLET: 25 | 30 days supply | Qty: 30 | Fill #0

## 2018-05-16 MED FILL — TRULICITY 1.5 MG/0.5 ML PEN: 1.5 | 28 days supply | Qty: 2 | Fill #3

## 2018-05-25 MED FILL — ALBUTEROL SULFATE HFA 108 (: 108 (90 BAS | 30 days supply | Qty: 18 | Fill #1

## 2018-05-25 MED FILL — JARDIANCE 25 MG TABLET: 25 | 30 days supply | Qty: 30 | Fill #0

## 2018-06-05 ENCOUNTER — Ambulatory Visit: Payer: 59 | Admitting: Internal Medicine

## 2018-06-06 ENCOUNTER — Other Ambulatory Visit: Payer: Self-pay | Admitting: Pharmacist

## 2018-06-20 MED FILL — ALBUTEROL SULFATE HFA 108 (: 108 (90 BAS | 30 days supply | Qty: 18 | Fill #2

## 2018-06-20 MED FILL — TRULICITY 1.5 MG/0.5 ML PEN: 1.5 | 28 days supply | Qty: 2 | Fill #4

## 2018-06-20 MED FILL — JARDIANCE 25 MG TABLET: 25 | 30 days supply | Qty: 30 | Fill #1

## 2018-07-07 ENCOUNTER — Other Ambulatory Visit: Payer: Self-pay | Admitting: Internal Medicine

## 2018-07-07 MED FILL — LOSARTAN POTASSIUM 50 MG TA: 50 | 90 days supply | Qty: 90 | Fill #1

## 2018-07-07 MED FILL — METFORMIN HCL ER 500 MG TB2: 500 | 30 days supply | Qty: 120 | Fill #0

## 2018-07-19 MED FILL — ALBUTEROL SULFATE HFA 108 (: 108 (90 BAS | 30 days supply | Qty: 18 | Fill #3

## 2018-07-19 MED FILL — TRULICITY 1.5 MG/0.5 ML PEN: 1.5 | 28 days supply | Qty: 2 | Fill #5

## 2018-07-19 MED FILL — JARDIANCE 25 MG TABLET: 25 | 30 days supply | Qty: 30 | Fill #2

## 2018-07-20 ENCOUNTER — Other Ambulatory Visit: Payer: Self-pay | Admitting: Internal Medicine

## 2018-07-20 MED FILL — glipiZIDE ER 5 MG TB24: 5 | 90 days supply | Qty: 180 | Fill #0

## 2018-07-20 MED FILL — ROSUVASTATIN CALCIUM 5 MG T: 5 | 84 days supply | Qty: 36 | Fill #3

## 2018-07-20 MED FILL — TRULICITY 1.5 MG/0.5 ML PEN: 1.5 | 28 days supply | Qty: 2 | Fill #5

## 2018-07-20 MED FILL — ALBUTEROL SULFATE HFA 108 (: 108 (90 BAS | 30 days supply | Qty: 18 | Fill #3

## 2018-08-02 MED FILL — METFORMIN HCL ER 500 MG TB2: 500 | 30 days supply | Qty: 120 | Fill #1

## 2018-08-25 MED FILL — ALBUTEROL SULFATE HFA 108 (: 108 (90 BAS | 30 days supply | Qty: 18 | Fill #4

## 2018-08-25 MED FILL — JARDIANCE 25 MG TABLET: 25 | 30 days supply | Qty: 30 | Fill #3

## 2018-08-25 MED FILL — TRULICITY 1.5 MG/0.5 ML PEN: 1.5 | 28 days supply | Qty: 2 | Fill #6

## 2018-09-06 MED FILL — metFORMIN HCL ER 500 MG TB2: 500 | 30 days supply | Qty: 120 | Fill #2

## 2018-09-18 MED FILL — JARDIANCE 25 MG TABLET: 25 | 30 days supply | Qty: 30 | Fill #4

## 2018-09-18 MED FILL — ALBUTEROL SULFATE HFA 108 (: 108 (90 BAS | 30 days supply | Qty: 18 | Fill #5

## 2018-09-18 MED FILL — TRULICITY 1.5 MG/0.5 ML PEN: 1.5 | 84 days supply | Qty: 6 | Fill #7

## 2018-09-18 MED FILL — metFORMIN HCL ER 500 MG TB2: 500 | 30 days supply | Qty: 120 | Fill #2

## 2018-09-21 ENCOUNTER — Other Ambulatory Visit: Payer: Self-pay

## 2018-09-26 ENCOUNTER — Encounter: Payer: Self-pay | Admitting: Internal Medicine

## 2018-09-26 ENCOUNTER — Other Ambulatory Visit: Payer: Self-pay

## 2018-09-26 ENCOUNTER — Encounter

## 2018-09-26 ENCOUNTER — Ambulatory Visit: Payer: 59 | Admitting: Internal Medicine

## 2018-09-26 VITALS — BP 140/70 | HR 89 | Ht 65.5 in | Wt 251.0 lb

## 2018-09-26 DIAGNOSIS — Z6838 Body mass index (BMI) 38.0-38.9, adult: Secondary | ICD-10-CM

## 2018-09-26 DIAGNOSIS — E7849 Other hyperlipidemia: Secondary | ICD-10-CM

## 2018-09-26 DIAGNOSIS — E1165 Type 2 diabetes mellitus with hyperglycemia: Secondary | ICD-10-CM

## 2018-09-26 LAB — LDL CHOLESTEROL, DIRECT: Direct LDL: 154 mg/dL

## 2018-09-26 LAB — LIPID PANEL
Cholesterol: 210 mg/dL — ABNORMAL HIGH (ref 0–200)
HDL: 50.6 mg/dL (ref >39.00)
NonHDL: 158.9
Total CHOL/HDL Ratio: 4
Triglycerides: 201 mg/dL — ABNORMAL HIGH (ref 0.0–149.0)
VLDL: 40.2 mg/dL — ABNORMAL HIGH (ref 0.0–40.0)

## 2018-09-26 LAB — POCT GLYCOSYLATED HEMOGLOBIN (HGB A1C): Hemoglobin A1C: 7.8 % — AB (ref 4.0–5.6)

## 2018-09-26 LAB — MICROALBUMIN / CREATININE URINE RATIO
Creatinine,U: 54.1 mg/dL
Microalb Creat Ratio: 1.3 mg/g (ref 0.0–30.0)
Microalb, Ur: <0.7 mg/dL (ref 0.0–1.9)

## 2018-09-26 NOTE — Progress Notes (Signed)
Patient ID: Ashley Gilmore, female   DOB: 1965/09/24, 53 y.o.   MRN: [id number]  HPI: Ashley Gilmore is a 53 y.o.-year-old female, returning for f/u for DM2, dx in GDM in 1995 and 1997, then DM2 in ~2010, non-insulin-dependent, uncontrolled, without long-term complications. Last visit 7 months ago.  Reviewed latest HbA1c levels: Lab Results  Component Value Date   HGBA1C 7.4 (A) 02/28/2018   HGBA1C 7.4 (H) 10/31/2017   HGBA1C 7.5 (A) 09/27/2017  04/30/2014: HbA1c 7.5%  Pt is on a regimen of: - Metformin XR 500 mg 2x a day >> 1000 mg with dinner - Jardiance 25 mg before b'fast - Victoza 1.8 mg daily in a.m. >> Trulicity 1.5 mg weekly - Glipizide XL 5 mg before b'fast and 5 mg (crushed) before dinner. Stopped Invokana 100 mg daily in am b/c of an yeast inf- started 02/2014  She tried regular Metformin when prediabetic >> severe diarrhea.  Pt checks her sugars once a day-prolonged: - am:  138-172, 180 >> 129-166, 171 >> 128-141, 162 - 2h after b'fast:n/c >> 184  >> n/c - before lunch:  107, 122 >> 111-128 >> n/c - 2h after lunch:131 >> 163 >> n/c - before dinner:  62-128, 160 >> 74-122 >> 74-124 - 2h after dinner:  88-172 >> 138 >> 98, 104 - bedtime: 140, 171 >> 128-157, 170 >> 125-150, 170 - nighttime: n/c >> 70-130 >> n/c Lowest sugar was 60 >> 62 >> 74 >> 74; she has hypoglycemia awareness in the 70s. Highest sugar was  182 >> 180 >> 171 >> 210 (icecream).  Glucometer:  [employer] Next  Pt's meals are: - Breakfast: protein bar - mid-am snack: apple + PB - Lunch: sandwich or salad - Dinner: meat + green veggie + starch - Snacks: 2 fruit, [name], [name], last BUN/creatinine:  Lab Results  Component Value Date   BUN 11 04/11/2017   CREATININE 0.71 04/11/2017  On losartan. -+ HL; last set of lipids: Lab Results  Component Value Date   CHOL 168 12/20/2017   HDL 48 (L) 12/20/2017   LDLCALC 98 12/20/2017   TRIG 119 12/20/2017   CHOLHDL  3.5 12/20/2017  On Zocor.  However, she does have muscle cramps with it. - last eye exam was in fall 2018: No DR -No numbness and tingling in her feet.  She quit smoking >> on Chantix.  ROS: Constitutional: no weight gain/no weight loss, no fatigue, no subjective hyperthermia, no subjective hypothermia Eyes: no blurry vision, no xerophthalmia ENT: no sore throat, no nodules palpated in neck, no dysphagia, no odynophagia, no hoarseness Cardiovascular: no CP/no SOB/no palpitations/no leg swelling Respiratory: no cough/no SOB/no wheezing Gastrointestinal: no N/no V/no D/no C/+ acid reflux Musculoskeletal: + muscle aches/+ joint aches Skin: no rashes, no hair loss Neurological: no tremors/no numbness/no tingling/no dizziness  I reviewed pt's medications, allergies, [name], social hx, family hx, [name], unchanged from my initial visit note.  Past Medical History:  Diagnosis Date  . Hyperlipidemia   . Insomnia   . Obesity    Past Surgical History:  Procedure Laterality Date  . TUBAL LIGATION     History   Social History  . Marital Status: married    Spouse Name: N/A    Number of Children: 2   Occupational History  . Music therapist   Social History Main Topics  . Smoking status: Current  Every Day Smoker -- 0.5 packs/day    Types: Cigarettes  . Smokeless tobacco: Never Used  . Alcohol Use: No  . Drug Use: No   Current Outpatient Medications on File Prior to Visit  Medication Sig Dispense Refill  . ACCU-CHEK GUIDE test strip USE AS INSTRUCTED TO CHECK SUGAR 2 TIMES DAILY 200 each 5  . b complex vitamins tablet Take 1 tablet by mouth daily.    Marland Kitchen BAYER MICROLET LANCETS lancets Use to test blood sugar 2 times daily as instructed. Dx: E11.65 100 each 5  . cetirizine (ZYRTEC) 10 MG tablet Take 10 mg by mouth daily.      . cholecalciferol (VITAMIN D) 1000 UNITS tablet Take 1,000 Units by mouth daily.     .  Dulaglutide (TRULICITY) 1.5 0000000 SOPN Inject 1.5 mg into the skin once a week. 4 pen 11  . glipiZIDE (GLUCOTROL XL) 5 MG 24 hr tablet TAKE 1 TABLET BY MOUTH TWICE A DAY BEFORE A MEAL 180 tablet 3  . JARDIANCE 25 MG TABS tablet TAKE 1 TABLET BY MOUTH DAILY BEFORE BREAKFAST. 30 tablet 5  . losartan (COZAAR) 50 MG tablet TAKE 1 TABLET (50 MG TOTAL) BY MOUTH DAILY. 90 tablet 1  . metFORMIN (GLUCOPHAGE-XR) 500 MG 24 hr tablet TAKE 2 TABLETS BY MOUTH TWICE DAILY WITH A MEAL 360 tablet 0  . pantoprazole (PROTONIX) 40 MG tablet TAKE 1 TABLET BY MOUTH ONCE DAILY 90 tablet 1  . Probiotic Product ([employer] PROBIOTIC) CAPS Take 1 daily 60 capsule 2  . rosuvastatin (CRESTOR) 5 MG tablet One po 3 times a week 36 tablet 3  . UNIFINE PENTIPS 32G X 4 MM MISC USE ONCE DAILY AS DIRECTED 100 each 3  . varenicline (CHANTIX) 0.5 MG tablet Take 1 tablet (0.5 mg total) by mouth 2 (two) times daily. 60 tablet 0  . VENTOLIN HFA 108 (90 Base) MCG/ACT inhaler INHALE 2 PUFFS BY MOUTH FOUR TIMES DAILY 54 g 12  . VICTOZA 18 MG/3ML SOPN INJECT 1.8 MG UNDER SKIN DAILY IN THE MORNING AS DIRECTED 6 mL 5   No current facility-administered medications on file prior to visit.    Allergies  Allergen Reactions  . Metformin And Related Diarrhea  . Penicillins Rash   Family History  Problem Relation Age of Onset  . Diabetes Mother   . Hypertension Mother   . Diabetes Father   . Hypertension Father   . Hypertension Sister    PE: BP 140/70   Pulse 89   Ht 5' 5.5" (1.664 m)   Wt 251 lb (113.9 kg)   SpO2 96%   BMI 41.13 kg/m  Body mass index is 41.13 kg/m.  Wt Readings from Last 3 Encounters:  09/26/18 251 lb (113.9 kg)  02/28/18 246 lb (111.6 kg)  11/03/17 248 lb (112.5 kg)   Constitutional: overweight, in NAD Eyes: PERRLA, EOMI, no exophthalmos ENT: moist mucous membranes, no thyromegaly, no cervical lymphadenopathy Cardiovascular: RRR, No MRG Respiratory: CTA B Gastrointestinal: abdomen soft, NT, ND,  BS+ Musculoskeletal: no deformities, strength intact in all 4 Skin: moist, warm, no rashes Neurological: no tremor with outstretched hands, DTR normal in all 4  ASSESSMENT: 1. DM2, non-insulin-dependent, uncontrolled, without long term complications, but with am hyperglycemia  2. Obesity class 2 BMI Classification:  < 18.5 underweight   18.5-24.9 normal weight   25.0-29.9 overweight   30.0-34.9 class I obesity   35.0-39.9 class II obesity   ? 40.0 class III obesity   3. HL  PLAN:  1. Patient with longstanding, uncontrolled, type 2 diabetes, on oral antidiabetic regimen and GLP-1 receptor agonist.  She was previously on Invokana which we had to stop due to yeast infections.  She tolerates well Jardiance. -At last visit her sugars were high in the morning and at goal later in the day; we discussed about improving her dinner by reducing the portions and switching to a lower protein and carb dinner.  We also discussed about decreasing exercise and moving the entire metformin with dinner. I also suggested to switch from Victoza to Trulicity to simplify her regimen and also for better effect.   - sugars at home improved in am and they are almost all at goal later in the day - we checked her HbA1c: 7.8% (higher), but this is higher than expected >> will also check a fructosamine today - We did discuss the Trulicity just came out that higher doses now, at 3 mg and 4.5 mg weekly - not ready for pts quite now but we can try these at next OV - I suggested to:  Patient Instructions  Please continue: - Metformin ER 1000 mg with dinner, but try to add a 3rd tablet with b'fast - Jardiance 25 mg before b'fast - Glipizide XL 5 mg before b'fast and 5 mg (crushed) before dinner. - Trulicity 1.5 mg weekly >> move this on Saturday  Please stop at the lab.  Please return in 4 months with your sugar log  - advised to check sugars at different times of the day - 1-2x a day, rotating check times -  advised for yearly eye exams >> she is not UTD - return to clinic in 3-4 months    2. Obesity class 2 -She lost 4 pounds before last visit, but now gained 5 lbs since then -Continue GLP-1 receptor agonist and SGLT 2 inhibitor, which should also help with weight loss. She feels the effect of Trulicity declines during the weekend >> will move this on Saturday am. At next visit, may increase the dose.  3. HL -Reviewed the latest lipid panel from 12/2017: LDL lower than 100, HDL low Lab Results  Component Value Date   CHOL 168 12/20/2017   HDL 48 (L) 12/20/2017   LDLCALC 98 12/20/2017   TRIG 119 12/20/2017   CHOLHDL 3.5 12/20/2017  -Continue Zocor but does have muscle cramps with this >> qod. Rec's CoQ 10 or Turmeric  Component     Latest Ref Rng & Units 09/26/2018  Glucose     65 - 99 mg/dL 132 (H)  BUN     7 - 25 mg/dL 12  Creatinine     0.50 - 1.05 mg/dL 0.77  GFR, Est Non African American     > OR = 60 mL/min/1.72m2 88  GFR, Est African American     > OR = 60 mL/min/1.46m2 101  BUN/Creatinine Ratio     6 - 22 (calc) NOT APPLICABLE  Sodium     [id number] - 146 mmol/L 142  Potassium     3.5 - 5.3 mmol/L 5.0  Chloride     98 - 110 mmol/L 102  CO2     20 - 32 mmol/L 30  Calcium     8.6 - 10.4 mg/dL 9.7  Total Protein     6.1 - 8.1 g/dL 6.8  Albumin MSPROF     3.6 - 5.1 g/dL 4.2  Globulin     1.9 - 3.7 g/dL (calc) 2.6  AG Ratio  1.0 - 2.5 (calc) 1.6  Total Bilirubin     0.2 - 1.2 mg/dL 0.5  Alkaline phosphatase (APISO)     37 - 153 U/L 99  AST     10 - 35 U/L 31  ALT     6 - 29 U/L 45 (H)  Cholesterol     0 - 200 mg/dL 210 (H)  Triglycerides     0.0 - 149.0 mg/dL 201.0 (H)  HDL Cholesterol     >39.00 mg/dL 50.60  VLDL     0.0 - 40.0 mg/dL 40.2 (H)  Total CHOL/HDL Ratio      4  NonHDL      158.90  Microalb, Ur     0.0 - 1.9 mg/dL <0.7  Creatinine,U     mg/dL 54.1  MICROALB/CREAT RATIO     0.0 - 30.0 mg/g 1.3  Fructosamine     205 - 285 umol/L 271   Direct LDL     mg/dL 154.0   HbA1c calculated from fructosamine is excellent, at 6.2%.  This is lower than the directly measured HbA1c, which was 7.8%, and appears closer to her sugars at home. LDL is high, worsened.  I suspect noncompliance with Zocor.  By adding co-Q10 or turmeric, she may be able to take it more consistently.  Philemon Kingdom, MD PhD [facility] Endocrinology

## 2018-09-26 NOTE — Patient Instructions (Addendum)
Please continue: - Metformin ER 1000 mg with dinner, but try to add a 3rd tablet with b'fast - Jardiance 25 mg before b'fast - Glipizide XL 5 mg before b'fast and 5 mg (crushed) before dinner. - Trulicity 1.5 mg weekly >> move this on Saturday  Please stop at the lab.  Please return in 4 months with your sugar log

## 2018-09-28 LAB — COMPLETE METABOLIC PANEL WITH GFR
AG Ratio: 1.6 (calc) (ref 1.0–2.5)
ALT: 45 U/L — ABNORMAL HIGH (ref 6–29)
AST: 31 U/L (ref 10–35)
Albumin: 4.2 g/dL (ref 3.6–5.1)
Alkaline phosphatase (APISO): 99 U/L (ref 37–153)
BUN: 12 mg/dL (ref 7–25)
CO2: 30 mmol/L (ref 20–32)
Calcium: 9.7 mg/dL (ref 8.6–10.4)
Chloride: 102 mmol/L (ref 98–110)
Creat: 0.77 mg/dL (ref 0.50–1.05)
GFR, Est African American: 101 mL/min/{1.73_m2} (ref >=60)
GFR, Est Non African American: 88 mL/min/{1.73_m2} (ref >=60)
Globulin: 2.6 g/dL (calc) (ref 1.9–3.7)
Glucose, Bld: 132 mg/dL — ABNORMAL HIGH (ref 65–99)
Potassium: 5 mmol/L (ref 3.5–5.3)
Sodium: 142 mmol/L (ref 135–146)
Total Bilirubin: 0.5 mg/dL (ref 0.2–1.2)
Total Protein: 6.8 g/dL (ref 6.1–8.1)

## 2018-09-28 LAB — FRUCTOSAMINE: Fructosamine: 271 umol/L (ref 205–285)

## 2018-10-13 MED FILL — JARDIANCE 25 MG TABLET: 25 | 30 days supply | Qty: 30 | Fill #5

## 2018-10-13 MED FILL — glipiZIDE ER 5 MG TB24: 5 | 90 days supply | Qty: 180 | Fill #1

## 2018-11-24 ENCOUNTER — Other Ambulatory Visit: Payer: Self-pay | Admitting: Internal Medicine

## 2018-11-24 MED FILL — LOSARTAN POTASSIUM 50 MG TA: 50 | 90 days supply | Qty: 90 | Fill #0

## 2018-11-24 MED FILL — ROSUVASTATIN CALCIUM 5 MG T: 5 | 84 days supply | Qty: 36 | Fill #0

## 2018-11-24 MED FILL — JARDIANCE 25 MG TABLET: 25 | 90 days supply | Qty: 90 | Fill #0

## 2018-12-05 MED FILL — TRULICITY 1.5 MG/0.5 ML PEN: 1.5 | 30 days supply | Qty: 2 | Fill #8

## 2018-12-05 MED FILL — ALBUTEROL SULFATE HFA 108 (: 108 (90 BAS | 30 days supply | Qty: 18 | Fill #6

## 2018-12-06 ENCOUNTER — Other Ambulatory Visit: Payer: Self-pay

## 2018-12-06 MED ORDER — TRULICITY 1.5 MG/0.5ML ~~LOC~~ SOAJ: 1.5000 mg | SUBCUTANEOUS | 11 refills | Status: DC

## 2018-12-29 MED FILL — TRULICITY 1.5 MG/0.5 ML PEN: 1.5 | 30 days supply | Qty: 2 | Fill #9

## 2019-01-01 DIAGNOSIS — E119 Type 2 diabetes mellitus without complications: Secondary | ICD-10-CM | POA: Diagnosis not present

## 2019-01-01 LAB — HM DIABETES EYE EXAM

## 2019-01-08 MED FILL — glipiZIDE ER 5 MG TB24: 5 | 90 days supply | Qty: 180 | Fill #2

## 2019-01-15 ENCOUNTER — Encounter: Payer: Self-pay | Admitting: Internal Medicine

## 2019-01-16 MED FILL — glipiZIDE ER 5 MG TB24: 5 | 90 days supply | Qty: 180 | Fill #2

## 2019-01-26 ENCOUNTER — Ambulatory Visit: Payer: 59 | Admitting: Internal Medicine

## 2019-02-09 MED FILL — TRULICITY 1.5 MG/0.5 ML PEN: 1.5 | 28 days supply | Qty: 2 | Fill #0

## 2019-02-15 ENCOUNTER — Ambulatory Visit: Payer: 59 | Admitting: Internal Medicine

## 2019-02-16 MED FILL — ROSUVASTATIN CALCIUM 5 MG T: 5 | 84 days supply | Qty: 36 | Fill #1

## 2019-02-16 MED FILL — JARDIANCE 25 MG TABLET: 25 | 90 days supply | Qty: 90 | Fill #1

## 2019-02-16 MED FILL — LOSARTAN POTASSIUM 50 MG TA: 50 | 90 days supply | Qty: 90 | Fill #1

## 2019-03-06 MED FILL — TRULICITY 1.5 MG/0.5 ML PEN: 1.5 | 28 days supply | Qty: 2 | Fill #1

## 2019-03-27 DIAGNOSIS — L821 Other seborrheic keratosis: Secondary | ICD-10-CM | POA: Diagnosis not present

## 2019-03-27 DIAGNOSIS — D485 Neoplasm of uncertain behavior of skin: Secondary | ICD-10-CM | POA: Diagnosis not present

## 2019-03-28 MED FILL — TRULICITY 1.5 MG/0.5 ML PEN: 1.5 | 28 days supply | Qty: 2 | Fill #2

## 2019-04-12 MED FILL — GLIPIZIDE ER 5 MG TB24: 5 | 90 days supply | Qty: 180 | Fill #3

## 2019-04-23 ENCOUNTER — Ambulatory Visit: Payer: 59 | Admitting: Internal Medicine

## 2019-04-24 MED FILL — TRULICITY 1.5 MG/0.5 ML PEN: 1.5 | 28 days supply | Qty: 2 | Fill #3

## 2019-05-16 MED FILL — TRULICITY 1.5 MG/0.5 ML PEN: 1.5 | 28 days supply | Qty: 2 | Fill #4

## 2019-05-30 ENCOUNTER — Other Ambulatory Visit: Payer: Self-pay | Admitting: Internal Medicine

## 2019-06-05 ENCOUNTER — Other Ambulatory Visit: Payer: Self-pay

## 2019-06-05 ENCOUNTER — Other Ambulatory Visit: Payer: Self-pay | Admitting: Internal Medicine

## 2019-06-05 ENCOUNTER — Encounter: Payer: Self-pay | Admitting: Internal Medicine

## 2019-06-05 ENCOUNTER — Ambulatory Visit: Payer: 59 | Admitting: Internal Medicine

## 2019-06-05 VITALS — BP 140/82 | HR 88 | Ht 65.5 in | Wt 250.0 lb

## 2019-06-05 DIAGNOSIS — E1165 Type 2 diabetes mellitus with hyperglycemia: Secondary | ICD-10-CM | POA: Diagnosis not present

## 2019-06-05 DIAGNOSIS — Z6838 Body mass index (BMI) 38.0-38.9, adult: Secondary | ICD-10-CM

## 2019-06-05 DIAGNOSIS — E7849 Other hyperlipidemia: Secondary | ICD-10-CM

## 2019-06-05 LAB — POCT GLYCOSYLATED HEMOGLOBIN (HGB A1C): Hemoglobin A1C: 7.8 % — AB (ref 4.0–5.6)

## 2019-06-05 MED ORDER — GLIPIZIDE ER 5 MG PO TB24: ORAL_TABLET | ORAL | 3 refills | Status: DC

## 2019-06-05 MED ORDER — TRULICITY 3 MG/0.5ML ~~LOC~~ SOAJ: 3.0000 mg | SUBCUTANEOUS | 11 refills | Status: DC

## 2019-06-05 MED ORDER — METFORMIN HCL ER 500 MG PO TB24: ORAL_TABLET | ORAL | 3 refills | Status: DC

## 2019-06-05 MED FILL — metFORMIN HCL ER 500 MG TB2: 500 | 90 days supply | Qty: 180 | Fill #0

## 2019-06-05 NOTE — Progress Notes (Signed)
Patient ID: Ashley Gilmore, female   DOB: 12/03/1965, 54 y.o.   MRN: [id number]  This visit occurred during the SARS-CoV-2 public health emergency.  Safety protocols were in place, including screening questions prior to the visit, additional usage of staff PPE, and extensive cleaning of exam room while observing appropriate contact time as indicated for disinfecting solutions.   HPI: Ashley Gilmore is a 54 y.o.-year-old female, returning for f/u for DM2, dx in GDM in 1995 and 1997, then DM2 in ~2010, non-insulin-dependent, uncontrolled, without long-term complications. Last visit 7 months ago.  Reviewed HbA1c levels: Lab Results  Component Value Date   HGBA1C 7.8 (A) 09/26/2018   HGBA1C 7.4 (A) 02/28/2018   HGBA1C 7.4 (H) 10/31/2017  04/30/2014: HbA1c 7.5%  Pt is on a regimen of: - Metformin XR 500 mg 2x a day >> 1000 mg with dinner >> 500 mg with breakfast and 1000 mg with dinner  - but GI sxs (misses doses) - Jardiance 25 mg before b'fast - Victoza 1.8 mg daily in a.m. >> Trulicity 1.5 mg weekly - Glipizide XL 5 mg before b'fast and 5 mg (crushed) before dinner. Stopped Invokana 100 mg daily in am b/c of an yeast inf- started 02/2014  She tried regular Metformin when prediabetic >> severe diarrhea.  Pt checks her sugars once a day: - am:  138-172, 180 >> 129-166, 171 >> 128-141, 162 >> 125-184 - 2h after b'fast:n/c >> 184  >> n/c - before lunch:  107, 122 >> 111-128 >> n/c - 2h after lunch:131 >> 163 >> n/c - before dinner:  62-128, 160 >> 74-122 >> 74-124 >> 90-120 - 2h after dinner:  88-172 >> 138 >> 98, 104 >> n/c - bedtime: 140, 171 >> 128-157, 170 >> 125-150, 170 >> n/c - nighttime: n/c >> 70-130 >> n/c Lowest sugar was 60 >> 62 >> 74 >> 74 >> 85; she has hypoglycemia awareness in the 70s. Highest sugar was  182 >> 180 >> 171 >> 210 (icecream) >> 190.  Glucometer:  [employer] Next  Pt's meals are: - Breakfast: protein bar - mid-am snack: apple + PB - Lunch: sandwich or  salad - Dinner: meat + green veggie + starch - Snacks: 2 fruit, [name], [name], last BUN/creatinine:  Lab Results  Component Value Date   BUN 12 09/26/2018   CREATININE 0.77 09/26/2018  On losartan. -+ HL; last set of lipids: Lab Results  Component Value Date   CHOL 210 (H) 09/26/2018   HDL 50.60 09/26/2018   LDLCALC 98 12/20/2017   LDLDIRECT 154.0 09/26/2018   TRIG 201.0 (H) 09/26/2018   CHOLHDL 4 09/26/2018  On Zocor-muscle cramps - last eye exam was in 11/2018: No DR -She denies numbness and tingling in her feet.  She quit smoking >> on Chantix.  ROS: Constitutional: no weight gain/no weight loss, no fatigue, no subjective hyperthermia, no subjective hypothermia Eyes: no blurry vision, no xerophthalmia ENT: no sore throat, no nodules palpated in neck, no dysphagia, no odynophagia, no hoarseness Cardiovascular: no CP/no SOB/no palpitations/no leg swelling Respiratory: no cough/no SOB/no wheezing Gastrointestinal: no N/no V/no D/no C/no acid reflux Musculoskeletal: + muscle aches/+ joint aches Skin: no rashes, no hair loss Neurological: no tremors/no numbness/no tingling/no dizziness  I reviewed pt's medications, allergies, [name], social hx, family hx, [name], unchanged from my initial visit note.  Past Medical History:  Diagnosis Date  .  Hyperlipidemia   . Insomnia   . Obesity    Past Surgical History:  Procedure Laterality Date  . TUBAL LIGATION     History   Social History  . Marital Status: married    Spouse Name: N/A    Number of Children: 2   Occupational History  . Music therapist   Social History Main Topics  . Smoking status: Current Every Day Smoker -- 0.5 packs/day    Types: Cigarettes  . Smokeless tobacco: Never Used  . Alcohol Use: No  . Drug Use: No   Current Outpatient Medications on File Prior to Visit  Medication Sig Dispense  Refill  . ACCU-CHEK GUIDE test strip USE AS INSTRUCTED TO CHECK SUGAR 2 TIMES DAILY 200 each 5  . b complex vitamins tablet Take 1 tablet by mouth daily.    Marland Kitchen BAYER MICROLET LANCETS lancets Use to test blood sugar 2 times daily as instructed. Dx: E11.65 100 each 5  . cetirizine (ZYRTEC) 10 MG tablet Take 10 mg by mouth daily.      . cholecalciferol (VITAMIN D) 1000 UNITS tablet Take 1,000 Units by mouth daily.     . Dulaglutide (TRULICITY) 1.5 0000000 SOPN Inject 1.5 mg into the skin once a week. 4 pen 11  . glipiZIDE (GLUCOTROL XL) 5 MG 24 hr tablet TAKE 1 TABLET BY MOUTH TWICE A DAY BEFORE A MEAL 180 tablet 3  . JARDIANCE 25 MG TABS tablet TAKE 1 TABLET BY MOUTH DAILY BEFORE BREAKFAST. 90 tablet 5  . losartan (COZAAR) 50 MG tablet TAKE 1 TABLET BY MOUTH ONCE DAILY 90 tablet 1  . metFORMIN (GLUCOPHAGE-XR) 500 MG 24 hr tablet TAKE 2 TABLETS BY MOUTH TWICE DAILY WITH A MEAL 360 tablet 0  . pantoprazole (PROTONIX) 40 MG tablet TAKE 1 TABLET BY MOUTH ONCE DAILY 90 tablet 1  . Probiotic Product (CVS PROBIOTIC) CAPS Take 1 daily 60 capsule 2  . rosuvastatin (CRESTOR) 5 MG tablet TAKE 1 TABLET BY MOUTH 3 TIMES PER WEEK 36 tablet 3  . UNIFINE PENTIPS 32G X 4 MM MISC USE ONCE DAILY AS DIRECTED 100 each 3  . varenicline (CHANTIX) 0.5 MG tablet Take 1 tablet (0.5 mg total) by mouth 2 (two) times daily. 60 tablet 0  . VENTOLIN HFA 108 (90 Base) MCG/ACT inhaler INHALE 2 PUFFS BY MOUTH FOUR TIMES DAILY 54 g 12  . VICTOZA 18 MG/3ML SOPN INJECT 1.8 MG UNDER SKIN DAILY IN THE MORNING AS DIRECTED 6 mL 5   No current facility-administered medications on file prior to visit.   Allergies  Allergen Reactions  . Metformin And Related Diarrhea  . Penicillins Rash   Family History  Problem Relation Age of Onset  . Diabetes Mother   . Hypertension Mother   . Diabetes Father   . Hypertension Father   . Hypertension Sister    PE: BP 140/82   Pulse 88   Ht 5' 5.5" (1.664 m)   Wt 250 lb (113.4 kg)   SpO2  98%   BMI 40.97 kg/m  Body mass index is 40.97 kg/m.  Wt Readings from Last 3 Encounters:  06/05/19 250 lb (113.4 kg)  09/26/18 251 lb (113.9 kg)  02/28/18 246 lb (111.6 kg)   Constitutional: overweight, in NAD Eyes: PERRLA, EOMI, no exophthalmos ENT: moist mucous membranes, no thyromegaly, no cervical lymphadenopathy Cardiovascular: RRR, No MRG Respiratory: CTA B Gastrointestinal: abdomen soft, NT, ND, BS+ Musculoskeletal: no deformities, strength intact in all 4 Skin: moist, warm, no rashes  Neurological: no tremor with outstretched hands, DTR normal in all 4  ASSESSMENT: 1. DM2, non-insulin-dependent, uncontrolled, without long term complications, but with am hyperglycemia  2. Obesity class 2 BMI Classification:  < 18.5 underweight   18.5-24.9 normal weight   25.0-29.9 overweight   30.0-34.9 class I obesity   35.0-39.9 class II obesity   ? 40.0 class III obesity   3. HL  PLAN:  1. Patient with  Longstanding, uncontrolled DM2, on oral Metformin ER and Jardiance along with sulfonylurea, and also weekly GLP-1 receptor agonist.  She was previously on Invokana, which we have to start decreased infection.  She tolerates Jardiance well.  At this visit, sugars are still above target especially in the morning, occasionally in the 180s.  In the past, she had low blood sugars, but not anymore.   -At this visit, we discussed about increasing the Trulicity dose to 30 mg weekly.  I advised her that if her sugars decrease midmorning or later in the day, we may need to stop glipizide XL. -For now, will advise him to continue the prednisone through her diet.  She is trying to cut down carbs. - I suggested to:  Patient Instructions  Please continue: - Metformin ER 500 mg 2x a day - Jardiance 25 mg before b'fast - Glipizide XL 5 mg before b'fast and 5 mg (crushed) before dinner.  Please increase: - Trulicity 3 mg weekly  You may need to stop am Glipizide if sugars decrease during  the day.  Please return in 4 months with your sugar log  - we checked her HbA1c: 7.8% (stable) - advised to check sugars at different times of the day - 1x a day, rotating check times - advised for yearly eye exams >> she is UTD - return to clinic in 4 months    2. Obesity class 2 -Continue GLP-1 receptor agonist (and will increase the dose now) and SGLT inhibitor, which should also help with weight loss -No weight loss since last visit  3. HL -Reviewed latest lipid panel from 09/2018: LDL and triglycerides above target Lab Results  Component Value Date   CHOL 210 (H) 09/26/2018   HDL 50.60 09/26/2018   LDLCALC 98 12/20/2017   LDLDIRECT 154.0 09/26/2018   TRIG 201.0 (H) 09/26/2018   CHOLHDL 4 09/26/2018  -She continues Zocor but has muscle cramps -improved, except for last night, when they were worse.  I recommended co-Q10 and turmeric.  She is taking these.   Philemon Kingdom, MD PhD [facility] End[facility]in[facility]

## 2019-06-05 NOTE — Patient Instructions (Signed)
Please continue: - Metformin ER 500 mg 2x a day - Jardiance 25 mg before b'fast - Glipizide XL 5 mg before b'fast and 5 mg (crushed) before dinner.  Please increase: - Trulicity 3 mg weekly  You may need to stop am Glipizide if sugars decrease during the day.  Please return in 4 months with your sugar log

## 2019-06-05 NOTE — Addendum Note (Signed)
Addended by: Cardell Peach I on: 06/05/2019 03:38 PM   Modules accepted: Orders

## 2019-06-06 ENCOUNTER — Telehealth: Payer: Self-pay | Admitting: Internal Medicine

## 2019-06-06 ENCOUNTER — Other Ambulatory Visit: Payer: Self-pay | Admitting: Internal Medicine

## 2019-06-06 MED ORDER — FREESTYLE LANCETS MISC
12 refills | Status: AC
Start: 2019-06-06 — End: ?

## 2019-06-06 MED ORDER — FREESTYLE LITE DEVI: 0 refills | Status: AC

## 2019-06-06 MED ORDER — FREESTYLE LITE TEST VI STRP: ORAL_STRIP | 12 refills | Status: DC

## 2019-06-06 MED FILL — FREESTYLE LANCETS: 90 days supply | Qty: 100 | Fill #0

## 2019-06-06 MED FILL — FREESTYLE LITE TEST STRIP: 90 days supply | Qty: 100 | Fill #0

## 2019-06-06 MED FILL — FREESTYLE LITE METER: 20 days supply | Qty: 1 | Fill #0

## 2019-06-06 NOTE — Telephone Encounter (Signed)
Noted.  These have been sent.

## 2019-06-06 NOTE — Telephone Encounter (Signed)
Amber with [facility] - called re: Patient called Museum/gallery conservator to [employer] know what is covered by insurance which is: [employer], Strips and Lancets

## 2019-06-22 MED FILL — TRULICITY 1.5 MG/0.5 ML PEN: 1.5 | 28 days supply | Qty: 2 | Fill #5

## 2019-07-16 ENCOUNTER — Other Ambulatory Visit: Payer: Self-pay | Admitting: Internal Medicine

## 2019-07-16 ENCOUNTER — Telehealth: Payer: Self-pay | Admitting: Internal Medicine

## 2019-07-16 MED ORDER — TRULICITY 3 MG/0.5ML ~~LOC~~ SOAJ: 3.0000 mg | SUBCUTANEOUS | 11 refills | Status: DC

## 2019-07-16 MED FILL — TRULICITY 3 MG/0.5ML SOPN: 3 | 28 days supply | Qty: 2 | Fill #0

## 2019-07-16 MED FILL — GLIPIZIDE ER 5 MG TB24: 5 | 90 days supply | Qty: 180 | Fill #0

## 2019-07-16 NOTE — Telephone Encounter (Signed)
Medication Refill Request  Did you call your pharmacy and request this refill first? Yes  . If patient has not contacted pharmacy first, instruct them to do so for future refills.  . Remind them that contacting the pharmacy for their refill is the quickest method to get the refill.  . Refill policy also stated that it will take anywhere between 24-72 hours to receive the refill.    Name of medication? trulicity 3 mg - looks like it was set to status of "fill later" on Rx from 06/05/2019  Is this a 90 day supply? Yes  Name and location of pharmacy?  [place], [place] - [address]  [place], [place] [address]  Phone:  [phone] Fax:  [phone]

## 2019-07-16 NOTE — Telephone Encounter (Signed)
RX sent

## 2019-08-09 MED FILL — TRULICITY 3 MG/0.5ML SOPN: 3 | 28 days supply | Qty: 2 | Fill #1

## 2019-08-14 ENCOUNTER — Encounter: Payer: Self-pay | Admitting: Internal Medicine

## 2019-08-24 MED FILL — JARDIANCE 25 MG TABLET: 25 | 90 days supply | Qty: 90 | Fill #1

## 2019-08-27 MED FILL — metFORMIN HCL ER 500 MG TB2: 500 | 90 days supply | Qty: 180 | Fill #1

## 2019-08-27 MED FILL — ROSUVASTATIN CALCIUM 5 MG T: 5 | 84 days supply | Qty: 36 | Fill #3

## 2019-09-03 MED FILL — TRULICITY 3 MG/0.5ML SOPN: 3 | 84 days supply | Qty: 6 | Fill #2

## 2019-09-04 ENCOUNTER — Ambulatory Visit (AMBULATORY_SURGERY_CENTER): Payer: Self-pay | Admitting: *Deleted

## 2019-09-04 ENCOUNTER — Other Ambulatory Visit: Payer: Self-pay

## 2019-09-04 VITALS — Ht 66.0 in | Wt 244.0 lb

## 2019-09-04 DIAGNOSIS — Z1211 Encounter for screening for malignant neoplasm of colon: Secondary | ICD-10-CM

## 2019-09-04 MED ORDER — NA SULFATE-K SULFATE-MG SULF 17.5-3.13-1.6 GM/177ML PO SOLN: 1.0000 | Freq: Once | ORAL | 0 refills | Status: AC

## 2019-09-04 MED FILL — SUPREP BOWEL PREP KIT: 17.5-3.13-1 | 2 days supply | Qty: 354 | Fill #0

## 2019-09-04 NOTE — Progress Notes (Signed)

## 2019-09-05 ENCOUNTER — Encounter: Payer: Self-pay | Admitting: Gastroenterology

## 2019-10-01 MED FILL — FREESTYLE LITE TEST STRIP: 90 days supply | Qty: 100 | Fill #1

## 2019-10-04 ENCOUNTER — Encounter: Payer: 59 | Admitting: Internal Medicine

## 2019-10-09 ENCOUNTER — Ambulatory Visit: Payer: 59 | Admitting: Internal Medicine

## 2019-10-19 MED FILL — GLIPIZIDE ER 5 MG TB24: 5 | 90 days supply | Qty: 180 | Fill #1

## 2019-10-25 ENCOUNTER — Other Ambulatory Visit: Payer: Self-pay

## 2019-10-25 ENCOUNTER — Encounter: Payer: Self-pay | Admitting: Gastroenterology

## 2019-10-25 ENCOUNTER — Ambulatory Visit (AMBULATORY_SURGERY_CENTER): Payer: 59 | Admitting: Gastroenterology

## 2019-10-25 VITALS — BP 122/79 | HR 80 | Temp 97.2°F | Resp 13 | Ht 65.5 in | Wt 244.0 lb

## 2019-10-25 DIAGNOSIS — Z1211 Encounter for screening for malignant neoplasm of colon: Secondary | ICD-10-CM | POA: Diagnosis not present

## 2019-10-25 DIAGNOSIS — K635 Polyp of colon: Secondary | ICD-10-CM | POA: Diagnosis not present

## 2019-10-25 DIAGNOSIS — D123 Benign neoplasm of transverse colon: Secondary | ICD-10-CM

## 2019-10-25 MED ORDER — SODIUM CHLORIDE 0.9 % IV SOLN: 500.0000 mL | Freq: Once | INTRAVENOUS | Status: DC

## 2019-10-25 NOTE — Progress Notes (Signed)
PT taken to PACU. Monitors in place. VSS. Report given to RN. 

## 2019-10-25 NOTE — Progress Notes (Signed)
Called to room to assist during endoscopic procedure.  Patient ID and intended procedure confirmed with present staff. Received instructions for my participation in the procedure from the performing physician.  

## 2019-10-25 NOTE — Progress Notes (Signed)
VS- Ashley Gilmore  Pt's states no medical or surgical changes since previsit or office visit.  

## 2019-10-25 NOTE — Patient Instructions (Signed)
Handout on polyps given to you today  Await pathology results   YOU HAD AN ENDOSCOPIC PROCEDURE TODAY AT THE [facility]:   Refer to the procedure report that was given to you for any specific questions about what was found during the examination.  If the procedure report does not answer your questions, please call your gastroenterologist to clarify.  If you requested that your care partner not be given the details of your procedure findings, then the procedure report has been included in a sealed envelope for you to review at your convenience later.  YOU SHOULD EXPECT: Some feelings of bloating in the abdomen. Passage of more gas than usual.  Walking can help get rid of the air that was put into your GI tract during the procedure and reduce the bloating. If you had a lower endoscopy (such as a colonoscopy or flexible sigmoidoscopy) you may notice spotting of blood in your stool or on the toilet paper. If you underwent a bowel prep for your procedure, you may not have a normal bowel movement for a few days.  Please Note:  You might notice some irritation and congestion in your nose or some drainage.  This is from the oxygen used during your procedure.  There is no need for concern and it should clear up in a day or so.  SYMPTOMS TO REPORT IMMEDIATELY:   Following lower endoscopy (colonoscopy or flexible sigmoidoscopy):  Excessive amounts of blood in the stool  Significant tenderness or worsening of abdominal pains  Swelling of the abdomen that is new, acute  Fever of 100F or higher  For urgent or emergent issues, a gastroenterologist can be reached at any hour by calling [phone]. Do not use MyChart messaging for urgent concerns.    DIET:  We do recommend a small meal at first, but then you may proceed to your regular diet.  Drink plenty of fluids but you should avoid alcoholic beverages for 24 hours.  ACTIVITY:  You should plan to take it easy for the rest of today and  you should NOT DRIVE or use heavy machinery until tomorrow (because of the sedation medicines used during the test).    FOLLOW UP: Our staff will call the number listed on your records 48-72 hours following your procedure to check on you and address any questions or concerns that you may have regarding the information given to you following your procedure. If we do not reach you, we will leave a message.  We will attempt to reach you two times.  During this call, we will ask if you have developed any symptoms of COVID 19. If you develop any symptoms (ie: fever, flu-like symptoms, shortness of breath, cough etc.) before then, please call [phone].  If you test positive for Covid 19 in the 2 weeks post procedure, please call and report this information to us.    If any biopsies were taken you will be contacted by phone or by letter within the next 1-3 weeks.  Please call us at [phone] if you have not heard about the biopsies in 3 weeks.    SIGNATURES/CONFIDENTIALITY: You and/or your care partner have signed paperwork which will be entered into your electronic medical record.  These signatures attest to the fact that that the information above on your After Visit Summary has been reviewed and is understood.  Full responsibility of the confidentiality of this discharge information lies with you and/or your care-partner. 

## 2019-10-25 NOTE — Op Note (Signed)
[place] Patient Name: Ashley Gilmore Procedure Date: 10/25/2019 7:16 AM MRN: [id number] Endoscopist: [place]. Loletha Carrow , MD Age: 54 Referring MD:  Date of Birth: 1965/06/09 Gender: Female Account #: [id number] Procedure:                Colonoscopy Indications:              Screening for colorectal malignant neoplasm, This                            is the patient's first colonoscopy Medicines:                Monitored Anesthesia Care Procedure:                Pre-Anesthesia Assessment:                           - Prior to the procedure, a History and Physical                            was performed, and patient medications and                            allergies were reviewed. The patient's tolerance of                            previous anesthesia was also reviewed. The risks                            and benefits of the procedure and the sedation                            options and risks were discussed with the patient.                            All questions were answered, and informed consent                            was obtained. Prior Anticoagulants: The patient has                            taken no previous anticoagulant or antiplatelet                            agents. ASA Grade Assessment: III - A patient with                            severe systemic disease. After reviewing the risks                            and benefits, the patient was deemed in                            satisfactory condition to undergo the procedure.  After obtaining informed consent, the colonoscope                            was passed under direct vision. Throughout the                            procedure, the patient's blood pressure, pulse, and                            oxygen saturations were monitored continuously. The                            Colonoscope was introduced through the anus and                            advanced to the the terminal  ileum, with                            identification of the appendiceal orifice and IC                            valve. The colonoscopy was performed without                            difficulty. The patient tolerated the procedure                            well. The quality of the bowel preparation was                            excellent. The ileocecal valve, appendiceal                            orifice, and rectum were photographed. The bowel                            preparation used was SUPREP. Scope In: 8:03:18 AM Scope Out: 8:19:36 AM Scope Withdrawal Time: 0 hours 13 minutes 59 seconds  Total Procedure Duration: 0 hours 16 minutes 18 seconds  Findings:                 The perianal and digital rectal examinations were                            normal.                           The terminal ileum appeared normal.                           A 4 mm polyp was found in the mid transverse colon.                            The polyp was semi-pedunculated. The polyp was  removed with a cold snare. Resection and retrieval                            were complete.                           A 10 mm polyp was found in the distal transverse                            colon. The polyp was sessile. The polyp was removed                            with a cold snare. Resection and retrieval were                            complete.                           The exam was otherwise without abnormality on                            direct and retroflexion views. Complications:            No immediate complications. Estimated Blood Loss:     Estimated blood loss was minimal. Impression:               - The examined portion of the ileum was normal.                           - One 4 mm polyp in the mid transverse colon,                            removed with a cold snare. Resected and retrieved.                           - One 10 mm polyp in the distal transverse  colon,                            removed with a cold snare. Resected and retrieved.                           - The examination was otherwise normal on direct                            and retroflexion views. Recommendation:           - Patient has a contact number available for                            emergencies. The signs and symptoms of potential                            delayed complications were discussed with the  patient. Return to normal activities tomorrow.                            Written discharge instructions were provided to the                            patient.                           - Resume previous diet.                           - Continue present medications.                           - Await pathology results.                           - Repeat colonoscopy is recommended for                            surveillance. The colonoscopy date will be                            determined after pathology results from today's                            exam become available for review. [name] L. Loletha Carrow, MD 10/25/2019 8:23:56 AM This report has been signed electronically.

## 2019-10-29 ENCOUNTER — Telehealth: Payer: Self-pay

## 2019-10-29 MED FILL — GLIPIZIDE ER 5 MG TB24: 5 | 90 days supply | Qty: 180 | Fill #1

## 2019-10-29 NOTE — Telephone Encounter (Signed)
  Follow up Call-  Call back number 10/25/2019  Post procedure Call Back phone  # [phone]  Permission to leave phone message No  Some recent data might be hidden     Patient questions:  Do you have a fever, pain , or abdominal swelling? No. Pain Score  0 *  Have you tolerated food without any problems? Yes.    Have you been able to return to your normal activities? Yes.    Do you have any questions about your discharge instructions: Diet   No. Medications  No. Follow up visit  No.  Do you have questions or concerns about your Care? No.  Actions: * If pain score is 4 or above: No action needed, pain <4.  1. Have you developed a fever since your procedure? no  2.   Have you had an respiratory symptoms (SOB or cough) since your procedure? no  3.   Have you tested positive for COVID 19 since your procedure no  4.   Have you had any family members/close contacts diagnosed with the COVID 19 since your procedure?  no   If yes to any of these questions please route to Joylene John, RN and Joella Prince, RN

## 2019-10-31 ENCOUNTER — Encounter: Payer: Self-pay | Admitting: Gastroenterology

## 2019-11-06 ENCOUNTER — Ambulatory Visit: Payer: 59 | Admitting: Internal Medicine

## 2019-11-17 DIAGNOSIS — Z23 Encounter for immunization: Secondary | ICD-10-CM | POA: Diagnosis not present

## 2019-11-19 MED FILL — TRULICITY 3 MG/0.5ML SOPN: 3 | 84 days supply | Qty: 6 | Fill #3

## 2019-11-19 MED FILL — metFORMIN HCL ER 500 MG TB2: 500 | 90 days supply | Qty: 180 | Fill #2

## 2019-11-19 MED FILL — JARDIANCE 25 MG TABLET: 25 | 90 days supply | Qty: 90 | Fill #2

## 2019-11-22 ENCOUNTER — Telehealth: Payer: Self-pay | Admitting: Internal Medicine

## 2019-11-22 NOTE — Telephone Encounter (Signed)
Ashley Gilmore [phone]  [facility] [place] called to say she has vaginal itch for several days that is not getting any better, she would like to come in. She has not change anything, so not sure what is going on.

## 2019-11-22 NOTE — Telephone Encounter (Signed)
OK 

## 2019-11-22 NOTE — Telephone Encounter (Signed)
Scheduled

## 2019-11-23 ENCOUNTER — Ambulatory Visit: Payer: 59 | Admitting: Internal Medicine

## 2019-11-23 ENCOUNTER — Other Ambulatory Visit: Payer: Self-pay

## 2019-11-23 ENCOUNTER — Other Ambulatory Visit: Payer: Self-pay | Admitting: Internal Medicine

## 2019-11-23 VITALS — BP 110/88 | HR 110 | Temp 98.2°F | Ht 65.5 in | Wt 245.0 lb

## 2019-11-23 DIAGNOSIS — B373 Candidiasis of vulva and vagina: Secondary | ICD-10-CM | POA: Diagnosis not present

## 2019-11-23 DIAGNOSIS — E119 Type 2 diabetes mellitus without complications: Secondary | ICD-10-CM | POA: Diagnosis not present

## 2019-11-23 DIAGNOSIS — L304 Erythema intertrigo: Secondary | ICD-10-CM

## 2019-11-23 DIAGNOSIS — B3731 Acute candidiasis of vulva and vagina: Secondary | ICD-10-CM

## 2019-11-23 LAB — POCT WET PREP (WET MOUNT)

## 2019-11-23 MED ORDER — KETOCONAZOLE 2 % EX CREA: TOPICAL_CREAM | CUTANEOUS | 6 refills | Status: DC

## 2019-11-23 MED ORDER — AZITHROMYCIN 250 MG PO TABS: ORAL_TABLET | ORAL | 0 refills | Status: DC

## 2019-11-23 MED ORDER — TERCONAZOLE 0.4 % VA CREA: 1.0000 | TOPICAL_CREAM | Freq: Every day | VAGINAL | 2 refills | Status: DC

## 2019-11-23 MED FILL — KETOCONAZOLE 2% CREAM: 2 | 7 days supply | Qty: 15 | Fill #0

## 2019-11-23 MED FILL — AZITHROMYCIN 250 MG TABLET: 250 | 5 days supply | Qty: 6 | Fill #0

## 2019-11-23 MED FILL — TERCONAZOLE 0.4% CREAM: 0.4 | 5 days supply | Qty: 45 | Fill #0

## 2019-11-23 NOTE — Progress Notes (Signed)
   Subjective:    Patient ID: Ashley Gilmore, female    DOB: 02-27-1965, 54 y.o.   MRN: [id number]  HPI 54 year old Female presents today with vaginal discharge and itching.  Symptoms have been present for several days and not getting better.  Has not been on antibiotics recently.  Patient has a history of diabetes mellitus treated by endocrinologist.  She is on Jardiance, Trulicity, glipizide, Metformin ER.  Stopped Invokana in the remote past because of complaint of yeast infection.  Tried Metformin but it caused severe diarrhea previously but is able to tolerate somewhat.  Describes discharge as thick and white.  No recent antibiotic therapy.  Hemoglobin A1c in May was 7.8%  Review of Systems see above     Objective:   Physical Exam Blood pressure 110/88 pulse 110 but she is anxious about the vaginal discharge BMI 40.15 temperature 98.2 degrees orally pulse oximetry 95% weight 245 pounds  Normal female external genitalia.  She has copious white discharge in vaginal vault.  Wet prep consistent with budding yeast     Assessment & Plan:  Candida vaginitis-prescribed Terazol 7 vaginal cream nightly x7 days followed by vinegar and water douche x1  Type 2 diabetes mellitus non-insulin-dependent seen by Dr. Renne Crigler  Also has intertrigo under her breasts for which ketoconazole cream has been prescribed today as well.  Terazol 7 vaginal cream 1 applicator full at bedtime for 7 days followed by vinegar and water douche x1.  2 refills given on Terazol cream.

## 2019-11-28 MED FILL — TERCONAZOLE 0.4% CREAM: 0.4 | 5 days supply | Qty: 45 | Fill #1

## 2019-12-02 DIAGNOSIS — Z20822 Contact with and (suspected) exposure to covid-19: Secondary | ICD-10-CM | POA: Diagnosis not present

## 2019-12-16 ENCOUNTER — Encounter: Payer: Self-pay | Admitting: Internal Medicine

## 2019-12-16 NOTE — Patient Instructions (Signed)
Ketoconazole cream to use topically under breast and in the intertriginous areas that are affected daily.  For Candida vaginitis use Terazol 7 vaginal cream nightly for 7 days followed by vinegar and water douche once.

## 2019-12-17 MED FILL — KETOCONAZOLE 2% CREAM: 2 | 7 days supply | Qty: 15 | Fill #1

## 2019-12-17 MED FILL — TERCONAZOLE 0.4% CREAM: 0.4 | 5 days supply | Qty: 45 | Fill #2

## 2019-12-25 ENCOUNTER — Encounter: Payer: Self-pay | Admitting: Internal Medicine

## 2019-12-25 ENCOUNTER — Other Ambulatory Visit: Payer: Self-pay

## 2019-12-25 ENCOUNTER — Ambulatory Visit: Payer: 59 | Admitting: Internal Medicine

## 2019-12-25 VITALS — BP 140/90 | HR 103 | Ht 66.0 in | Wt 245.4 lb

## 2019-12-25 DIAGNOSIS — Z6838 Body mass index (BMI) 38.0-38.9, adult: Secondary | ICD-10-CM | POA: Diagnosis not present

## 2019-12-25 DIAGNOSIS — E7849 Other hyperlipidemia: Secondary | ICD-10-CM

## 2019-12-25 DIAGNOSIS — E1165 Type 2 diabetes mellitus with hyperglycemia: Secondary | ICD-10-CM

## 2019-12-25 LAB — POCT GLYCOSYLATED HEMOGLOBIN (HGB A1C): Hemoglobin A1C: 6.7 % — AB (ref 4.0–5.6)

## 2019-12-25 NOTE — Progress Notes (Signed)
Patient ID: Ashley Gilmore, female   DOB: [birth year], 54 y.o.   MRN: [id number]  This visit occurred during the SARS-CoV-2 public health emergency.  Safety protocols were in place, including screening questions prior to the visit, additional usage of staff PPE, and extensive cleaning of exam room while observing appropriate contact time as indicated for disinfecting solutions.   HPI: Ashley Gilmore is a 54 y.o.-year-old female, returning for f/u for DM2, dx in GDM in 1995 and 1997, then DM2 in ~2010, non-insulin-dependent, uncontrolled, without long-term complications.  Her husband is also my patient.  Last visit 7 months ago.  She just returned from [place] where her daughter is working. She also visited [place].  Her sugars are exemplary while traveling abroad.  Reviewed HbA1c levels: Lab Results  Component Value Date   HGBA1C 7.8 (A) 06/05/2019   HGBA1C 7.8 (A) 09/26/2018   HGBA1C 7.4 (A) 02/28/2018  04/30/2014: HbA1c 7.5%  Pt is on a regimen of: - Metformin XR 500 mg 2x a day >> 1000 mg with dinner >> 500 mg with breakfast and 1000 mg with dinner-some GI symptoms - Jardiance 25 mg before b'fast - Victoza 1.8 mg daily in a.m. >> Trulicity 1.5 >> 3 mg weekly - Glipizide XL 5 mg before b'fast and 5 mg (crashed) before dinner Stopped Invokana 100 mg daily in am b/c of an yeast inf- started 02/2014  She tried regular Metformin when prediabetic >> severe diarrhea.  Pt checks her sugars once a day: - am: 129-166, 171 >> 128-141, 162 >> 125-184 >> 89-146, 155 - 2h after b'fast:n/c >> 184  >> n/c  - before lunch:  107, 122 >> 111-128 >> n/c  - 2h after lunch:131 >> 163 >> n/c >> 117 - before dinner: 74-122 >> 74-124 >> 90-120 >> 76-121 - 2h after dinner:  88-172 >> 138 >> 98, 104 >> n/c - bedtime: 128-157, 170 >> 125-150, 170 >> n/c >> 117-150 - nighttime: n/c >> 70-130 >> n/c >> 84-115 Lowest sugar was 60 >> 62 >> 74 >> 74 >> 85 >> 76; she has hypoglycemia awareness in the 70s. Highest sugar was   182 >> 180 >> 171 >> 210 (icecream) >> 190 >> 155.  Glucometer:  [employer] Next  Pt's meals are: - Breakfast: protein bar - mid-am snack: apple + PB - Lunch: sandwich or salad - Dinner: meat + green veggie + starch - Snacks: 2 fruit, [name], [name], last BUN/creatinine:  Lab Results  Component Value Date   BUN 12 09/26/2018   CREATININE 0.77 09/26/2018  On losartan. -+ HL; last set of lipids: Lab Results  Component Value Date   CHOL 210 (H) 09/26/2018   HDL 50.60 09/26/2018   LDLCALC 98 12/20/2017   LDLDIRECT 154.0 09/26/2018   TRIG 201.0 (H) 09/26/2018   CHOLHDL 4 09/26/2018  Previously on Zocor-muscle cramps >> now on Crestor 5 mg 3-5x a week. - last eye exam was on 01/01/2019: No DR -No numbness and tingling in her feet.  She quit smoking >> on Chantix.  ROS: Constitutional: no weight gain/no weight loss, no fatigue, no subjective hyperthermia, no subjective hypothermia Eyes: no blurry vision, no xerophthalmia ENT: no sore throat, no nodules palpated in neck, no dysphagia, no odynophagia, no hoarseness Cardiovascular: no CP/no SOB/no palpitations/no leg swelling Respiratory: no cough/no SOB/no wheezing Gastrointestinal: no N/no V/no D/no C/no acid reflux Musculoskeletal: + muscle and joint aches Skin: no rashes, no  hair loss Neurological: no tremors/no numbness/no tingling/no dizziness  I reviewed pt's medications, allergies, [name], social hx, family hx, [name], unchanged from my initial visit note.  Past Medical History:  Diagnosis Date  . Allergy   . Diabetes mellitus without complication ([place])   . Hyperlipidemia   . Insomnia   . Obesity    Past Surgical History:  Procedure Laterality Date  . oral surgeries    . TUBAL LIGATION     History   Social History  . Marital Status: married    Spouse Name: N/A    Number of Children: 2   Occupational  History  . Music therapist   Social History Main Topics  . Smoking status: Current Every Day Smoker -- 0.5 packs/day    Types: Cigarettes  . Smokeless tobacco: Never Used  . Alcohol Use: No  . Drug Use: No   Current Outpatient Medications on File Prior to Visit  Medication Sig Dispense Refill  . azithromycin (ZITHROMAX Z-PAK) 250 MG tablet 2 po day 1 followed by one po days 2-5 6 each 0  . b complex vitamins tablet Take 1 tablet by mouth daily.    . Blood Glucose Monitoring Suppl (FREESTYLE LITE) DEVI Use to check blood sugar once a day. 1 each 0  . cetirizine (ZYRTEC) 10 MG tablet Take 10 mg by mouth daily.      . cholecalciferol (VITAMIN D) 1000 UNITS tablet Take 1,000 Units by mouth daily.     . Dulaglutide (TRULICITY) 3 LF/8.1OF SOPN Inject 0.5 mLs (3 mg total) into the skin once a week. 4 pen 11  . glipiZIDE (GLUCOTROL XL) 5 MG 24 hr tablet TAKE 1 TABLET BY MOUTH TWICE A DAY BEFORE A MEAL 180 tablet 3  . glucose blood (FREESTYLE LITE) test strip Use to check blood sugar once a day. 100 each 12  . JARDIANCE 25 MG TABS tablet TAKE 1 TABLET BY MOUTH DAILY BEFORE BREAKFAST. 90 tablet 5  . ketoconazole (NIZORAL) 2 % cream Use in intertriginous areas once a day 15 g 6  . Lancets (FREESTYLE) lancets Use to check blood sugar once a day. 100 each 12  . losartan (COZAAR) 50 MG tablet TAKE 1 TABLET BY MOUTH ONCE DAILY 90 tablet 1  . metFORMIN (GLUCOPHAGE-XR) 500 MG 24 hr tablet TAKE 1 TABLETS BY MOUTH TWICE DAILY WITH A MEAL 180 tablet 3  . Probiotic Product ([employer] PROBIOTIC) CAPS Take 1 daily 60 capsule 2  . rosuvastatin (CRESTOR) 5 MG tablet TAKE 1 TABLET BY MOUTH 3 TIMES PER WEEK 36 tablet 3  . terconazole (TERAZOL 7) 0.4 % vaginal cream Place 1 applicator vaginally at bedtime. 45 g 2  . UNIFINE PENTIPS 32G X 4 MM MISC USE ONCE DAILY AS DIRECTED 100 each 3  . VENTOLIN HFA 108 (90 Base) MCG/ACT inhaler INHALE 2 PUFFS BY MOUTH FOUR TIMES DAILY 54 g 12   No current facility-administered  medications on file prior to visit.   Allergies  Allergen Reactions  . Metformin And Related Diarrhea  . Penicillins Rash   Family History  Problem Relation Age of Onset  . Diabetes Mother   . Hypertension Mother   . Diabetes Father   . Hypertension Father   . Hypertension Sister   . Colon cancer Maternal Grandmother   . Esophageal cancer Neg Hx   . Stomach cancer Neg Hx   . Rectal cancer Neg Hx    PE:  BP 140/90   Pulse (!) 103   Ht 5\' 6"  (1.676 m)   Wt 245 lb 6.4 oz (111.3 kg)   SpO2 95%   BMI 39.61 kg/m  Body mass index is 39.61 kg/m.  Wt Readings from Last 3 Encounters:  12/25/19 245 lb 6.4 oz (111.3 kg)  11/23/19 245 lb (111.1 kg)  10/25/19 244 lb (110.7 kg)   Constitutional: overweight, in NAD Eyes: PERRLA, EOMI, no exophthalmos ENT: moist mucous membranes, no thyromegaly, no cervical lymphadenopathy Cardiovascular: tachycardia, RR, No MRG Respiratory: CTA B Gastrointestinal: abdomen soft, NT, ND, BS+ Musculoskeletal: no deformities, strength intact in all 4 Skin: moist, warm, no rashes Neurological: no tremor with outstretched hands, DTR normal in all 4  ASSESSMENT: 1. DM2, non-insulin-dependent, uncontrolled, without long term complications, but with am hyperglycemia  2. Obesity class 2 BMI Classification:  < 18.5 underweight   18.5-24.9 normal weight   25.0-29.9 overweight   30.0-34.9 class I obesity   35.0-39.9 class II obesity   ? 40.0 class III obesity   3. HL  PLAN:  1. Patient with longstanding, uncontrolled, type 2 diabetes, on oral antidiabetic regimen with Metformin ER, sulfonylurea, and Jardiance.  She is also on weekly GLP-1 receptor agonist, dose increased at last visit.  After we increase the dose of Trulicity, her sugars started to improve.  She is also trying to control her diet by cutting down carbs.  She just returned from [place] where she was very active, walking a lot and eating less processed foods.  Her sugars were great  while traveling. -At today's visit, we reviewed together her meter downloads and her sugars remain mostly at goal at all times of the day, with occasional very mild hyperglycemic spikes especially in the morning.  We do not need to change her regimen for now. - I suggested to:  Patient Instructions  Please continue: - Metformin ER 500 mg 2x a day - Jardiance 25 mg before b'fast - Glipizide XL 5 mg before b'fast and 5 mg (crushed) before dinner. - Trulicity 3 mg weekly  Please return in 3-4 months with your sugar log  - we checked her HbA1c: 6.7% (lower) - advised to check sugars at different times of the day - 1x a day, rotating check times - advised for yearly eye exams >> she is UTD - She is due for annual labs but she is not sure whether she wants to obtain them here or in PCPs office.  I advised her to let me know if she wants to get them here so I can order them. - return to clinic in 3-4 months   2. Obesity class 2 -continue SGLT 2 inhibitor and GLP-1 receptor agonist which should also help with weight loss -No weight loss before last visit, she lost 5 pounds since last visit.  3. HL -Reviewed latest lipid panel from 09/2019: LDL slightly above target, as are her triglycerides Lab Results  Component Value Date   CHOL 210 (H) 09/26/2018   HDL 50.60 09/26/2018   LDLCALC 98 12/20/2017   LDLDIRECT 154.0 09/26/2018   TRIG 201.0 (H) 09/26/2018   CHOLHDL 4 09/26/2018  -She is now on Crestor 5 several times a week.  She previously had muscle cramps with Zocor.  We started co-Q10 and turmeric.   Philemon Kingdom, MD PhD [facility] Endocrinology

## 2019-12-25 NOTE — Addendum Note (Signed)
Addended by: Lauralyn Primes on: 12/25/2019 03:56 PM   Modules accepted: Orders

## 2019-12-25 NOTE — Patient Instructions (Addendum)
Please continue: - Metformin ER 500 mg 2x a day - Jardiance 25 mg before b'fast - Glipizide XL 5 mg before b'fast and 5 mg (crushed) before dinner - Trulicity 3 mg weekly  Please return in 3-4 months with your sugar log

## 2020-01-03 MED FILL — FREESTYLE LITE TEST STRIP: 90 days supply | Qty: 100 | Fill #2

## 2020-01-22 MED FILL — GLIPIZIDE ER 5 MG TB24: 5 | 90 days supply | Qty: 180 | Fill #2

## 2020-02-06 MED FILL — TRULICITY 3 MG/0.5ML SOPN: 3 | 84 days supply | Qty: 6 | Fill #4

## 2020-02-14 MED FILL — JARDIANCE 25 MG TABLET: 25 | 90 days supply | Qty: 90 | Fill #3

## 2020-02-14 MED FILL — metFORMIN HCL ER 500 MG TB2: 500 | 90 days supply | Qty: 180 | Fill #3

## 2020-02-22 DIAGNOSIS — E119 Type 2 diabetes mellitus without complications: Secondary | ICD-10-CM | POA: Diagnosis not present

## 2020-03-27 MED FILL — FREESTYLE LITE TEST STRIP: 90 days supply | Qty: 100 | Fill #3

## 2020-04-15 MED FILL — GLIPIZIDE ER 5 MG TB24: 5 | 90 days supply | Qty: 180 | Fill #3

## 2020-04-29 ENCOUNTER — Other Ambulatory Visit (HOSPITAL_COMMUNITY): Payer: Self-pay

## 2020-04-29 MED FILL — Dulaglutide Soln Auto-injector 3 MG/0.5ML: SUBCUTANEOUS | 28 days supply | Qty: 2 | Fill #0 | Status: AC

## 2020-05-01 ENCOUNTER — Ambulatory Visit: Payer: 59 | Admitting: Internal Medicine

## 2020-05-12 ENCOUNTER — Other Ambulatory Visit: Payer: Self-pay | Admitting: Internal Medicine

## 2020-05-12 ENCOUNTER — Other Ambulatory Visit (HOSPITAL_COMMUNITY): Payer: Self-pay

## 2020-05-12 MED ORDER — METFORMIN HCL ER 500 MG PO TB24
ORAL_TABLET | Freq: Two times a day (BID) | ORAL | 0 refills | Status: DC
  Filled 2020-05-12: qty 120, 60d supply, fill #0

## 2020-05-12 MED FILL — Empagliflozin Tab 25 MG: ORAL | 90 days supply | Qty: 90 | Fill #0 | Status: AC

## 2020-05-13 ENCOUNTER — Other Ambulatory Visit (HOSPITAL_COMMUNITY): Payer: Self-pay

## 2020-06-18 ENCOUNTER — Telehealth: Payer: Self-pay | Admitting: Internal Medicine

## 2020-06-18 NOTE — Telephone Encounter (Signed)
I spoke with her last night. She is worried. Please go ahead and start filling these out. I will sign. Defer OV for now.

## 2020-06-18 NOTE — Telephone Encounter (Signed)
Received Form from [employer] for FMLA forms. Also talked with Ashley Gilmore and she said she would like to get these forms filled out for intermittent, because she does not know what they are looking at. I did let her know it would require an office visit, she was not sure when she would have time to set one up.

## 2020-06-23 ENCOUNTER — Telehealth: Payer: Self-pay | Admitting: Internal Medicine

## 2020-06-23 DIAGNOSIS — Z0289 Encounter for other administrative examinations: Secondary | ICD-10-CM

## 2020-06-23 NOTE — Telephone Encounter (Signed)
Faxed Completed FMLA forms to [employer] [phone] Family member Shanon Brow  Ref [phone]

## 2020-07-14 ENCOUNTER — Other Ambulatory Visit: Payer: Self-pay

## 2020-07-14 ENCOUNTER — Encounter: Payer: Self-pay | Admitting: Internal Medicine

## 2020-07-14 ENCOUNTER — Ambulatory Visit: Payer: 59 | Admitting: Internal Medicine

## 2020-07-14 VITALS — BP 142/70 | HR 99 | Ht 66.0 in | Wt 241.0 lb

## 2020-07-14 DIAGNOSIS — E1165 Type 2 diabetes mellitus with hyperglycemia: Secondary | ICD-10-CM | POA: Diagnosis not present

## 2020-07-14 DIAGNOSIS — Z6838 Body mass index (BMI) 38.0-38.9, adult: Secondary | ICD-10-CM | POA: Diagnosis not present

## 2020-07-14 DIAGNOSIS — E7849 Other hyperlipidemia: Secondary | ICD-10-CM | POA: Diagnosis not present

## 2020-07-14 LAB — POCT GLYCOSYLATED HEMOGLOBIN (HGB A1C): Hemoglobin A1C: 7.2 % — AB (ref 4.0–5.6)

## 2020-07-14 NOTE — Addendum Note (Signed)
Addended by: Tyrone Apple on: 07/14/2020 04:05 PM   Modules accepted: Orders

## 2020-07-14 NOTE — Patient Instructions (Addendum)
Please continue: - Metformin ER 500 mg in am and [phone] mg with dinner - Glipizide XL 5 mg before b'fast and 5 mg (crushed) before dinner - Jardiance 25 mg before b'fast - Trulicity 3 mg weekly  Please return in 3-4 months with your sugar log

## 2020-07-14 NOTE — Progress Notes (Signed)
Patient ID: Ashley Gilmore, female   DOB: 1965/02/22, 55 y.o.   MRN: [id number]  This visit occurred during the SARS-CoV-2 public health emergency.  Safety protocols were in place, including screening questions prior to the visit, additional usage of staff PPE, and extensive cleaning of exam room while observing appropriate contact time as indicated for disinfecting solutions.   HPI: Elizibeth Breau Gilmore is a 55 y.o.-year-old female, returning for f/u for DM2, dx in GDM in 1995 and 1997, then DM2 in ~2010, non-insulin-dependent, uncontrolled, without long-term complications.  Her husband is also my patient.  Last visit 6.5 months ago.  Interim history: No increased urination, blurry vision, nausea, chest pain. Before last visit, she just returned from [place] where her daughter was working. She also visited [place].  Her sugars were exemplary while traveling abroad. She was very busy with her husband being sick since last visit. Both of them also had Covid 19 last month.  She recovered well.  Reviewed HbA1c levels: Lab Results  Component Value Date   HGBA1C 6.7 (A) 12/25/2019   HGBA1C 7.8 (A) 06/05/2019   HGBA1C 7.8 (A) 09/26/2018  04/30/2014: HbA1c 7.5%.  Pt is on a regimen of: - Metformin XR 500 mg 2x a day >> 1000 mg with dinner >> 500 mg with breakfast and [phone] mg with dinner-some GI symptoms - Jardiance 25 mg before b'fast - Victoza 1.8 mg daily in a.m. >> Trulicity 1.5 >> 3 mg weekly - Glipizide XL 5 mg before b'fast and 5 mg (crashed) before dinner Stopped Invokana 100 mg daily in am b/c of an yeast inf- started 02/2014  She tried regular Metformin when prediabetic >> severe diarrhea.  Pt checks her sugars once a day: - am:  125-184 >> 89-146, 155 >> 104-141, 146, 152 - 2h after b'fast:n/c >> 184  >> n/c  - before lunch:  107, 122 >> 111-128 >> n/c  - 2h after lunch:131 >> 163 >> n/c >> 117 >> n/c - before dinner: 74-124 >> 90-120 >> 76-121 >> 76-117 - 2h after dinner:  88-172 >> 138 >>  98, 104 >> n/c - bedtime:  125-150, 170 >> n/c >> 117-150 >> 106-147, 154 - nighttime: n/c >> 70-130 >> n/c >> 84-115 >> n/c Lowest sugar was 74 >> 85 >> 76 >> 76; she has hypoglycemia awareness in the 70s. Highest sugar was  210 (icecream) >> 190 >> 155.  Glucometer:  [employer] Next  Pt's meals are: - Breakfast: protein bar - mid-am snack: apple + PB - Lunch: sandwich or salad - Dinner: meat + green veggie + starch - Snacks: 2 fruit, [name], [name], last BUN/creatinine:  Lab Results  Component Value Date   BUN 12 09/26/2018   CREATININE 0.77 09/26/2018  On losartan.  -+ HL; last set of lipids: Lab Results  Component Value Date   CHOL 210 (H) 09/26/2018   HDL 50.60 09/26/2018   LDLCALC 98 12/20/2017   LDLDIRECT 154.0 09/26/2018   TRIG 201.0 (H) 09/26/2018   CHOLHDL 4 09/26/2018  Previously on Zocor-muscle cramps >> now on Crestor 5 mg 3-5x a week.  - last eye exam was on 2022: No DR reportedly.  -No numbness and tingling in her feet.  She quit smoking >> on Chantix.  ROS: Constitutional: no weight gain/no weight loss, no fatigue, no subjective hyperthermia, no subjective hypothermia Eyes: no blurry vision, no xerophthalmia ENT: no sore throat, no nodules palpated in neck,  no dysphagia, no odynophagia, no hoarseness Cardiovascular: no CP/no SOB/no palpitations/no leg swelling Respiratory: no cough/no SOB/no wheezing Gastrointestinal: no N/no V/no D/no C/no acid reflux Musculoskeletal: + muscle and joint aches Skin: no rashes, no hair loss Neurological: no tremors/no numbness/no tingling/no dizziness  I reviewed pt's medications, allergies, [name], social hx, family hx, [name], unchanged from my initial visit note.  Past Medical History:  Diagnosis Date   Allergy    Diabetes mellitus without complication ([place])    Hyperlipidemia    Insomnia    Obesity     Past Surgical History:  Procedure Laterality Date   oral surgeries     TUBAL LIGATION     History   Social History   Marital Status: married    Spouse Name: N/A    Number of Children: 2   Occupational History   Music therapist   Social History Main Topics   Smoking status: Current Every Day Smoker -- 0.5 packs/day    Types: Cigarettes   Smokeless tobacco: Never Used   Alcohol Use: No   Drug Use: No   Current Outpatient Medications on File Prior to Visit  Medication Sig Dispense Refill   azithromycin (ZITHROMAX) 250 MG tablet TAKE 2 TABLETS BY MOUTH ON DAY 1 THEN TAKE 1 TABLET DAILY FOR THE NEXT 4 DAYS 6 tablet 0   b complex vitamins tablet Take 1 tablet by mouth daily.     Blood Glucose Monitoring Suppl (FREESTYLE LITE) DEVI Use to check blood sugar once a day. 1 each 0   cetirizine (ZYRTEC) 10 MG tablet Take 10 mg by mouth daily.       cholecalciferol (VITAMIN D) 1000 UNITS tablet Take 1,000 Units by mouth daily.      Dulaglutide 3 MG/0.5ML SOPN INJECT 0.5 MLS (3 MG TOTAL) INTO THE SKIN ONCE A WEEK. 2 mL 11   empagliflozin (JARDIANCE) 25 MG TABS tablet TAKE 1 TABLET BY MOUTH DAILY BEFORE BREAKFAST. 90 tablet 5   glipiZIDE (GLUCOTROL XL) 5 MG 24 hr tablet TAKE 1 TABLET BY MOUTH TWICE DAILY BEFORE A MEAL 180 tablet 3   glucose blood test strip USE TO CHECK BLOOD SUGAR ONCE DAILY 100 strip 12   ketoconazole (NIZORAL) 2 % cream USE IN INTERTRIGINOUS AREAS OF SKIN ONCE A DAY AS DIRECTED 15 g 6   Lancets (FREESTYLE) lancets Use to check blood sugar once a day. 100 each 12   losartan (COZAAR) 50 MG tablet TAKE 1 TABLET BY MOUTH ONCE DAILY 90 tablet 1   metFORMIN (GLUCOPHAGE-XR) 500 MG 24 hr tablet TAKE 1 TABLET BY MOUTH TWICE DAILY WITH MEALS 120 tablet 0   Probiotic Product ([employer]S PROBIOTIC) CAPS Take 1 daily 60 capsule 2   rosuvastatin (CRESTOR) 5 MG tablet TAKE 1 TABLET BY MOUTH 3 TIMES PER WEEK 36 tablet 3   terconazole (TERAZOL 7) 0.4 % vaginal cream INSERT 1 APPLICATORFUL  VAGINALLY AT BEDTIME 45 g 2   UNIFINE PENTIPS 32G X 4 MM MISC USE ONCE DAILY AS DIRECTED 100 each 3   VENTOLIN HFA 108 (90 Base) MCG/ACT inhaler INHALE 2 PUFFS BY MOUTH FOUR TIMES DAILY 54 g 12   No current facility-administered medications on file prior to visit.   Allergies  Allergen Reactions   Metformin And Related Diarrhea   Penicillins Rash   Family History  Problem Relation Age of Onset   Diabetes Mother    Hypertension Mother    Diabetes Father  Hypertension Father    Hypertension Sister    Colon cancer Maternal Grandmother    Esophageal cancer Neg Hx    Stomach cancer Neg Hx    Rectal cancer Neg Hx    PE: BP (!) 142/70   Pulse 99   Ht 5\' 6"  (1.676 m)   Wt 241 lb (109.3 kg)   SpO2 97%   BMI 38.90 kg/m  Body mass index is 38.9 kg/m.  Wt Readings from Last 3 Encounters:  07/14/20 241 lb (109.3 kg)  12/25/19 245 lb 6.4 oz (111.3 kg)  11/23/19 245 lb (111.1 kg)   Constitutional: overweight, in NAD Eyes: PERRLA, EOMI, no exophthalmos ENT: moist mucous membranes, no thyromegaly, no cervical lymphadenopathy Cardiovascular: tachycardia, RR, No MRG Respiratory: CTA B Gastrointestinal: abdomen soft, NT, ND, BS+ Musculoskeletal: no deformities, strength intact in all 4 Skin: moist, warm, no rashes Neurological: no tremor with outstretched hands, DTR normal in all 4  ASSESSMENT: 1. DM2, non-insulin-dependent, uncontrolled, without long term complications, but with am hyperglycemia  2. Obesity class 2 BMI Classification: < 18.5 underweight  18.5-24.9 normal weight  25.0-29.9 overweight  30.0-34.9 class I obesity  35.0-39.9 class II obesity  ? 40.0 class III obesity   3. HL  PLAN:  1. Patient with longstanding, uncontrolled, type 2 diabetes, on oral antidiabetic regimen with metformin ER, sulfonylurea, and SGLT2 inhibitor and also weekly GLP-1 receptor agonist, with improved control at last visit.  At that time, she just returned from [place] where sugars  were excellent.  She still had blood sugars at goal at last visit with only occasional mild hyperglycemic spikes especially in the morning.  We did not change her regimen at that time.  HbA1c was better, at 6.7%. -At today's visit, sugars are slightly higher than before, as she admits for dietary indiscretions (sandwiches) and more stress due to her COVID-19 infection and also her husband being sick.  We did discuss about continuing with the same regimen while working on her diet and see if we need to change this at next visit, before the holidays.  She agrees with this plan. - I suggested to:  Patient Instructions  Please continue: - Metformin ER 500 mg 2x a day - Jardiance 25 mg before b'fast - Glipizide XL 5 mg before b'fast and 5 mg (crushed) before dinner. - Trulicity 3 mg weekly  Please return in 3-4 months with your sugar log  - we checked her HbA1c: 7.2% (slightly higher) - advised to check sugars at different times of the day - 1x a day, rotating check times - advised for yearly eye exams >> she is UTD - she has an appointment coming up with PCP for annual physical exam next month. - return to clinic in 3-4 months   2. Obesity class 2 -continue SGLT 2 inhibitor and GLP-1 receptor agonist which should also help with weight loss -She lost 5 pounds before last visit and 4 more since then  3. HL -Reviewed latest lipid panel from 09/2018: LDL slightly above target, as are her triglycerides - she is now on Crestor 5 mg several times a week.  She had muscle cramps on Zocor before.  Also on co-Q10 and turmeric. -She will have a new lipid panel with PCP next week  Philemon Kingdom, MD PhD [facility] Endocrinology

## 2020-07-15 ENCOUNTER — Other Ambulatory Visit (HOSPITAL_COMMUNITY): Payer: Self-pay

## 2020-07-15 ENCOUNTER — Other Ambulatory Visit: Payer: Self-pay | Admitting: Internal Medicine

## 2020-07-15 MED FILL — Ketoconazole Cream 2%: CUTANEOUS | 30 days supply | Qty: 15 | Fill #0 | Status: AC

## 2020-07-16 ENCOUNTER — Other Ambulatory Visit: Payer: Self-pay | Admitting: Internal Medicine

## 2020-07-16 ENCOUNTER — Other Ambulatory Visit (HOSPITAL_COMMUNITY): Payer: Self-pay

## 2020-07-16 MED ORDER — GLIPIZIDE ER 5 MG PO TB24
ORAL_TABLET | Freq: Two times a day (BID) | ORAL | 3 refills | Status: DC
Start: 2020-07-16 — End: 2021-04-02
  Filled 2020-07-16: qty 180, 90d supply, fill #0
  Filled 2020-10-13: qty 180, 90d supply, fill #1
  Filled 2021-01-14: qty 180, 90d supply, fill #2

## 2020-07-16 MED ORDER — FREESTYLE LITE TEST VI STRP
ORAL_STRIP | Freq: Every day | 12 refills | Status: DC
  Filled 2020-07-16: qty 100, 90d supply, fill #0
  Filled 2020-12-02: qty 100, 90d supply, fill #1

## 2020-07-16 MED ORDER — METFORMIN HCL ER 500 MG PO TB24
ORAL_TABLET | Freq: Two times a day (BID) | ORAL | 0 refills | Status: DC
  Filled 2020-07-16: qty 120, 30d supply, fill #0

## 2020-07-17 ENCOUNTER — Telehealth: Payer: Self-pay | Admitting: Internal Medicine

## 2020-07-17 ENCOUNTER — Other Ambulatory Visit (HOSPITAL_COMMUNITY): Payer: Self-pay

## 2020-07-17 NOTE — Telephone Encounter (Signed)
Called pt  and lvm for patient to call us back.

## 2020-07-17 NOTE — Telephone Encounter (Signed)
Patient is returning a call from [place] please call [phone] work phone

## 2020-07-17 NOTE — Telephone Encounter (Signed)
Patient needed to be advised if she was to continue on the medication Truicity no RX at pharmacy please advise

## 2020-07-18 ENCOUNTER — Other Ambulatory Visit (HOSPITAL_COMMUNITY): Payer: Self-pay

## 2020-07-18 ENCOUNTER — Other Ambulatory Visit: Payer: Self-pay | Admitting: Internal Medicine

## 2020-07-18 ENCOUNTER — Other Ambulatory Visit: Payer: Self-pay

## 2020-07-18 MED ORDER — TRULICITY 3 MG/0.5ML ~~LOC~~ SOAJ
3.0000 mg | SUBCUTANEOUS | 3 refills | Status: DC
  Filled 2020-07-18: qty 6, 84d supply, fill #0
  Filled 2020-10-13: qty 6, 84d supply, fill #1

## 2020-07-18 MED ORDER — TRULICITY 1.5 MG/0.5ML ~~LOC~~ SOAJ
3.0000 mg | SUBCUTANEOUS | 2 refills | Status: DC
  Filled 2020-07-18: qty 3, 21d supply, fill #0

## 2020-07-18 NOTE — Telephone Encounter (Signed)
Called and lvm for patient to inform her that she is continue with trulicity 3 mg weekly per dr. Darnell Level last office notes,  ask pt to call us back if she had any further concerns.

## 2020-07-22 ENCOUNTER — Other Ambulatory Visit (HOSPITAL_COMMUNITY): Payer: Self-pay

## 2020-07-24 ENCOUNTER — Other Ambulatory Visit: Payer: 59 | Admitting: Internal Medicine

## 2020-07-24 ENCOUNTER — Other Ambulatory Visit (HOSPITAL_COMMUNITY): Payer: Self-pay

## 2020-07-24 ENCOUNTER — Other Ambulatory Visit: Payer: Self-pay | Admitting: Internal Medicine

## 2020-07-24 ENCOUNTER — Other Ambulatory Visit: Payer: Self-pay

## 2020-07-24 DIAGNOSIS — E559 Vitamin D deficiency, unspecified: Secondary | ICD-10-CM

## 2020-07-24 DIAGNOSIS — Z1329 Encounter for screening for other suspected endocrine disorder: Secondary | ICD-10-CM | POA: Diagnosis not present

## 2020-07-24 DIAGNOSIS — Z0000 Encounter for general adult medical examination without abnormal findings: Secondary | ICD-10-CM

## 2020-07-24 DIAGNOSIS — E781 Pure hyperglyceridemia: Secondary | ICD-10-CM | POA: Diagnosis not present

## 2020-07-24 DIAGNOSIS — E8881 Metabolic syndrome: Secondary | ICD-10-CM

## 2020-07-24 DIAGNOSIS — E782 Mixed hyperlipidemia: Secondary | ICD-10-CM

## 2020-07-24 DIAGNOSIS — E785 Hyperlipidemia, unspecified: Secondary | ICD-10-CM | POA: Diagnosis not present

## 2020-07-24 DIAGNOSIS — M1712 Unilateral primary osteoarthritis, left knee: Secondary | ICD-10-CM | POA: Diagnosis not present

## 2020-07-24 DIAGNOSIS — E119 Type 2 diabetes mellitus without complications: Secondary | ICD-10-CM

## 2020-07-25 LAB — HEMOGLOBIN A1C
Hgb A1c MFr Bld: 7.2 % of total Hgb — ABNORMAL HIGH (ref <5.7)
Mean Plasma Glucose: 160 mg/dL
eAG (mmol/L): 8.9 mmol/L

## 2020-07-25 LAB — COMPLETE METABOLIC PANEL WITH GFR
AG Ratio: 1.6 (calc) (ref 1.0–2.5)
ALT: 35 U/L — ABNORMAL HIGH (ref 6–29)
AST: 27 U/L (ref 10–35)
Albumin: 4.1 g/dL (ref 3.6–5.1)
Alkaline phosphatase (APISO): 91 U/L (ref 37–153)
BUN: 14 mg/dL (ref 7–25)
CO2: 31 mmol/L (ref 20–32)
Calcium: 9.3 mg/dL (ref 8.6–10.4)
Chloride: 101 mmol/L (ref 98–110)
Creat: 0.8 mg/dL (ref 0.50–1.05)
GFR, Est African American: 96 mL/min/{1.73_m2} (ref >=60)
GFR, Est Non African American: 82 mL/min/{1.73_m2} (ref >=60)
Globulin: 2.5 g/dL (calc) (ref 1.9–3.7)
Glucose, Bld: 142 mg/dL — ABNORMAL HIGH (ref 65–99)
Potassium: 4.8 mmol/L (ref 3.5–5.3)
Sodium: 140 mmol/L (ref 135–146)
Total Bilirubin: 0.5 mg/dL (ref 0.2–1.2)
Total Protein: 6.6 g/dL (ref 6.1–8.1)

## 2020-07-25 LAB — CBC WITH DIFFERENTIAL/PLATELET
Absolute Monocytes: 360 cells/uL (ref 200–950)
Basophils Absolute: 59 cells/uL (ref 0–200)
Basophils Relative: 1 %
Eosinophils Absolute: 236 cells/uL (ref 15–500)
Eosinophils Relative: 4 %
HCT: 49.5 % — ABNORMAL HIGH (ref 35.0–45.0)
Hemoglobin: 16 g/dL — ABNORMAL HIGH (ref 11.7–15.5)
Lymphs Abs: 1699 cells/uL (ref 850–3900)
MCH: 27.6 pg (ref 27.0–33.0)
MCHC: 32.3 g/dL (ref 32.0–36.0)
MCV: 85.5 fL (ref 80.0–100.0)
MPV: 10 fL (ref 7.5–12.5)
Monocytes Relative: 6.1 %
Neutro Abs: 3546 cells/uL (ref 1500–7800)
Neutrophils Relative %: 60.1 %
Platelets: 284 10*3/uL (ref 140–400)
RBC: 5.79 10*6/uL — ABNORMAL HIGH (ref 3.80–5.10)
RDW: 13.2 % (ref 11.0–15.0)
Total Lymphocyte: 28.8 %
WBC: 5.9 10*3/uL (ref 3.8–10.8)

## 2020-07-25 LAB — VITAMIN D 25 HYDROXY (VIT D DEFICIENCY, FRACTURES): Vit D, 25-Hydroxy: 24 ng/mL — ABNORMAL LOW (ref 30–100)

## 2020-07-25 LAB — LIPID PANEL
Cholesterol: 188 mg/dL (ref <200)
HDL: 60 mg/dL (ref >=50)
LDL Cholesterol (Calc): 104 mg/dL (calc) — ABNORMAL HIGH
Non-HDL Cholesterol (Calc): 128 mg/dL (calc) (ref <130)
Total CHOL/HDL Ratio: 3.1 (calc) (ref <5.0)
Triglycerides: 137 mg/dL (ref <150)

## 2020-07-25 LAB — TSH: TSH: 2.15 mIU/L (ref 0.40–4.50)

## 2020-07-28 ENCOUNTER — Other Ambulatory Visit (HOSPITAL_COMMUNITY): Payer: Self-pay

## 2020-07-28 ENCOUNTER — Encounter: Payer: Self-pay | Admitting: Internal Medicine

## 2020-07-28 ENCOUNTER — Other Ambulatory Visit: Payer: Self-pay

## 2020-07-28 ENCOUNTER — Ambulatory Visit (INDEPENDENT_AMBULATORY_CARE_PROVIDER_SITE_OTHER): Payer: 59 | Admitting: Internal Medicine

## 2020-07-28 VITALS — BP 140/90 | HR 94 | Ht 66.0 in | Wt 241.0 lb

## 2020-07-28 DIAGNOSIS — E7800 Pure hypercholesterolemia, unspecified: Secondary | ICD-10-CM | POA: Diagnosis not present

## 2020-07-28 DIAGNOSIS — Z87891 Personal history of nicotine dependence: Secondary | ICD-10-CM | POA: Diagnosis not present

## 2020-07-28 DIAGNOSIS — Z8709 Personal history of other diseases of the respiratory system: Secondary | ICD-10-CM | POA: Diagnosis not present

## 2020-07-28 DIAGNOSIS — E1159 Type 2 diabetes mellitus with other circulatory complications: Secondary | ICD-10-CM

## 2020-07-28 DIAGNOSIS — E559 Vitamin D deficiency, unspecified: Secondary | ICD-10-CM

## 2020-07-28 DIAGNOSIS — Z0000 Encounter for general adult medical examination without abnormal findings: Secondary | ICD-10-CM

## 2020-07-28 DIAGNOSIS — F439 Reaction to severe stress, unspecified: Secondary | ICD-10-CM

## 2020-07-28 DIAGNOSIS — I152 Hypertension secondary to endocrine disorders: Secondary | ICD-10-CM

## 2020-07-28 LAB — POCT URINALYSIS DIPSTICK
Appearance: NEGATIVE
Bilirubin, UA: NEGATIVE
Blood, UA: NEGATIVE
Glucose, UA: POSITIVE — AB
Ketones, UA: NEGATIVE
Leukocytes, UA: NEGATIVE
Nitrite, UA: NEGATIVE
Odor: NEGATIVE
Protein, UA: NEGATIVE
Spec Grav, UA: 1.015 (ref 1.010–1.025)
Urobilinogen, UA: 0.2 E.U./dL
pH, UA: 6 (ref 5.0–8.0)

## 2020-07-28 MED ORDER — VENTOLIN HFA 108 (90 BASE) MCG/ACT IN AERS
2.0000 | INHALATION_SPRAY | Freq: Four times a day (QID) | RESPIRATORY_TRACT | 12 refills | Status: DC
  Filled 2020-07-28: qty 54, 75d supply, fill #0

## 2020-07-28 MED ORDER — ROSUVASTATIN CALCIUM 5 MG PO TABS
ORAL_TABLET | ORAL | 3 refills | Status: DC
  Filled 2020-07-28: qty 36, 90d supply, fill #0
  Filled 2020-11-03: qty 36, 90d supply, fill #1
  Filled 2021-02-08: qty 36, 90d supply, fill #2
  Filled 2021-05-06: qty 36, 90d supply, fill #3

## 2020-07-28 MED ORDER — LOSARTAN POTASSIUM 50 MG PO TABS
50.0000 mg | ORAL_TABLET | Freq: Every day | ORAL | 1 refills | Status: DC
  Filled 2020-07-28: qty 90, 90d supply, fill #0
  Filled 2020-10-23: qty 90, 90d supply, fill #1

## 2020-07-28 NOTE — Patient Instructions (Addendum)
Take Vitamin D supplement over the counter.  Continue current medications and follow-up here in 1 year or as needed.  Continue close follow-up with Endocrinologist.  Please have annual mammogram.  Continue current medications.

## 2020-07-29 ENCOUNTER — Other Ambulatory Visit (HOSPITAL_COMMUNITY): Payer: Self-pay

## 2020-08-01 ENCOUNTER — Other Ambulatory Visit: Payer: Self-pay | Admitting: Internal Medicine

## 2020-08-01 ENCOUNTER — Other Ambulatory Visit (HOSPITAL_COMMUNITY): Payer: Self-pay

## 2020-08-01 ENCOUNTER — Telehealth: Payer: Self-pay | Admitting: Internal Medicine

## 2020-08-01 MED ORDER — EMPAGLIFLOZIN 25 MG PO TABS
ORAL_TABLET | Freq: Every day | ORAL | 3 refills | Status: DC
  Filled 2020-08-01: qty 90, 90d supply, fill #0
  Filled 2020-11-03: qty 90, 90d supply, fill #1
  Filled 2021-02-08: qty 90, 90d supply, fill #2
  Filled 2021-05-06: qty 90, 90d supply, fill #3

## 2020-08-01 MED ORDER — ALBUTEROL SULFATE HFA 108 (90 BASE) MCG/ACT IN AERS
2.0000 | INHALATION_SPRAY | Freq: Four times a day (QID) | RESPIRATORY_TRACT | 0 refills | Status: DC | PRN
  Filled 2020-08-01: qty 8.5, 25d supply, fill #0

## 2020-08-01 NOTE — Telephone Encounter (Signed)
Original rx has been approved by insurance.

## 2020-08-01 NOTE — Telephone Encounter (Signed)
Ashley Gilmore [phone]  [name] called to say that the pharmacist told her if she got the generic of below medication the insurance would pay for it, but the name brand they would not, however she did not know anything about it needing prior authorization.   VENTOLIN HFA 108 (90 Base) MCG/ACT inhaler  [name] Phone:  [phone]  Fax:  [phone]

## 2020-09-07 NOTE — Progress Notes (Signed)
   Subjective:    Patient ID: Ashley Gilmore, female    DOB: 10/22/1965, 56 y.o.   MRN: [id number]  HPI 55 year old Female seen for health maintenance exam and evaluation of medical issues.  She has a history of essential hypertension, diabetes and hyperlipidemia.  Sees Dr. Letta Median for management of diabetes.  He has a history of GE reflux, insomnia, migraine headaches, asthma and obesity.  Past medical history: Oral surgery 1976 at which time his wisdom teeth were extracted x4.  Bilateral tubal ligation 2002.  She has had 2 pregnancies.  History of gestational diabetes.  History of postpartum depression.  History of seasonal allergic rhinitis.  Continues to smoke about 1/4 pack of cigarettes daily.  Does not get much exercise except at work.  Works full-time.  Husband was hospitalized recently after having COVID-19.  He apparently had a TIA.  MRI showed no acute infarct.  Social history: She is married.  2 adult daughters.  Husband works as a Chemical engineer.  She works as a Clinical biochemist at [employer].  She completed 2 years of college.  Family history: Father with history of hypertension and diabetes.  Mother with history of breast cancer, hypertension and diabetes.  Her mother is blind.  1 sister with hypertension.    Review of Systems see above-no new complaints     Objective:   Physical Exam Blood pressure 140/90 pulse 94 regular pulse oximetry 98% weight 241 pounds BMI 38.90  Skin: Warm and dry.  No thyromegaly.  No carotid bruits.  Chest clear to auscultation.  Breast are without masses.  Cardiac exam: Regular rate and rhythm.  Abdomen is soft nondistended without hepatosplenomegaly masses or tenderness.  No lower extremity pitting edema.  Neuro is intact without focal deficits.       Assessment & Plan:  Type 2 diabetes mellitus treated with Trulicity and Glucophage XR and Jardiance per endocrinologist  History of asthma treated with Ventolin inhaler  Hypertension  treated with losartan  History of smoking-once again ask her to quit smoking  History of allergic rhinitis treated with Zyrtec as needed  Hyperlipidemia-history of elevated LDL and low HDL.  Currently on Crestor 5 mg 3 times weekly.  LDL is 104.  Situational stress with husband's recent illness and work stress.  History of breast cancer in mother.  Needs mammogram.  Vitamin D deficiency-level was 24-recommend over-the-counter vitamin D supplement  Plan: Continue current medications and return in 1 year or as needed.  Continue follow-up with Endocrinologist.  Colonoscopy was done in 2021.  Diabetic eye exam done by [facility].  Records do not indicate COVID-vaccine.  Tetanus immunization is up-to-date.  Has had 2 pneumococcal vaccines.

## 2020-09-30 ENCOUNTER — Other Ambulatory Visit: Payer: Self-pay | Admitting: Internal Medicine

## 2020-10-01 ENCOUNTER — Other Ambulatory Visit (HOSPITAL_COMMUNITY): Payer: Self-pay

## 2020-10-01 MED ORDER — METFORMIN HCL ER 500 MG PO TB24
500.0000 mg | ORAL_TABLET | Freq: Two times a day (BID) | ORAL | 1 refills | Status: DC
  Filled 2020-10-01: qty 180, 90d supply, fill #0
  Filled 2021-01-14: qty 180, 90d supply, fill #1

## 2020-10-13 ENCOUNTER — Other Ambulatory Visit (HOSPITAL_COMMUNITY): Payer: Self-pay

## 2020-10-23 ENCOUNTER — Other Ambulatory Visit (HOSPITAL_COMMUNITY): Payer: Self-pay

## 2020-11-03 ENCOUNTER — Other Ambulatory Visit (HOSPITAL_COMMUNITY): Payer: Self-pay

## 2020-11-03 ENCOUNTER — Other Ambulatory Visit: Payer: Self-pay | Admitting: Internal Medicine

## 2020-11-03 MED ORDER — ALBUTEROL SULFATE HFA 108 (90 BASE) MCG/ACT IN AERS
2.0000 | INHALATION_SPRAY | Freq: Four times a day (QID) | RESPIRATORY_TRACT | 99 refills | Status: DC | PRN
  Filled 2020-11-03: qty 8.5, 25d supply, fill #0
  Filled 2021-01-21: qty 8.5, 25d supply, fill #1
  Filled 2021-03-03: qty 8.5, 25d supply, fill #2
  Filled 2021-06-11: qty 8.5, 25d supply, fill #3

## 2020-11-04 ENCOUNTER — Other Ambulatory Visit (HOSPITAL_COMMUNITY): Payer: Self-pay

## 2020-11-14 ENCOUNTER — Ambulatory Visit: Payer: 59 | Admitting: Internal Medicine

## 2020-12-02 ENCOUNTER — Other Ambulatory Visit (HOSPITAL_COMMUNITY): Payer: Self-pay

## 2020-12-23 ENCOUNTER — Encounter: Payer: Self-pay | Admitting: Internal Medicine

## 2020-12-23 ENCOUNTER — Other Ambulatory Visit (HOSPITAL_COMMUNITY): Payer: Self-pay

## 2020-12-23 ENCOUNTER — Other Ambulatory Visit: Payer: Self-pay

## 2020-12-23 ENCOUNTER — Ambulatory Visit: Payer: 59 | Admitting: Internal Medicine

## 2020-12-23 VITALS — BP 138/78 | HR 88 | Ht 66.0 in | Wt 244.4 lb

## 2020-12-23 DIAGNOSIS — E7849 Other hyperlipidemia: Secondary | ICD-10-CM

## 2020-12-23 DIAGNOSIS — E1165 Type 2 diabetes mellitus with hyperglycemia: Secondary | ICD-10-CM

## 2020-12-23 DIAGNOSIS — Z6838 Body mass index (BMI) 38.0-38.9, adult: Secondary | ICD-10-CM

## 2020-12-23 LAB — POCT GLYCOSYLATED HEMOGLOBIN (HGB A1C): Hemoglobin A1C: 7.7 % — AB (ref 4.0–5.6)

## 2020-12-23 MED ORDER — TIRZEPATIDE 5 MG/0.5ML ~~LOC~~ SOAJ
5.0000 mg | SUBCUTANEOUS | 3 refills | Status: DC
  Filled 2020-12-23: qty 2, 28d supply, fill #0
  Filled 2021-01-21: qty 2, 28d supply, fill #1
  Filled 2021-02-16: qty 2, 28d supply, fill #2

## 2020-12-23 NOTE — Patient Instructions (Addendum)
Please continue: - Metformin ER 500 mg 2x a day - Jardiance 25 mg before b'fast - Glipizide XL 5 mg before b'fast and 5 mg (crushed) before dinner.  Please change from Trulicity to: - Mounjaro 5 mg weekly  Please return in 3-4 months with your sugar log

## 2020-12-23 NOTE — Progress Notes (Signed)
Patient ID: Ashley Gilmore, female   DOB: 03-13-1965, 55 y.o.   MRN: [id number]  This visit occurred during the SARS-CoV-2 public health emergency.  Safety protocols were in place, including screening questions prior to the visit, additional usage of staff PPE, and extensive cleaning of exam room while observing appropriate contact time as indicated for disinfecting solutions.   HPI: Ashley Gilmore is a 55 y.o.-year-old female, returning for f/u for DM2, dx in GDM in 1995 and 1997, then DM2 in ~2010, non-insulin-dependent, uncontrolled, without long-term complications.  Her husband is also my patient.  Last visit 5 months ago.  Interim history: No increased urination, blurry vision, chest pain. She does have mild nausea when she takes Trulicity. She also has joint pains.  Reviewed HbA1c levels: Lab Results  Component Value Date   HGBA1C 7.2 (H) 07/24/2020   HGBA1C 7.2 (A) 07/14/2020   HGBA1C 6.7 (A) 12/25/2019  04/30/2014: HbA1c 7.5%.  Pt is on a regimen of: - Metformin XR 500 mg 2x a day >> 1000 mg with dinner >> 500 mg with breakfast and [phone] mg with dinner-some GI symptoms >> 500 mg 2x a day - Jardiance 25 mg before b'fast - Victoza 1.8 mg daily in a.m. >> Trulicity 1.5 >> 3 mg weekly - Glipizide XL 5 mg before b'fast and 5 mg (crushed) before dinner Stopped Invokana 100 mg daily in am b/c of an yeast inf- started 02/2014  She tried regular Metformin when prediabetic >> severe diarrhea.  Pt checks her sugars once a day: - am:  104-141, 146, 152 >> 110, 124-170, 210 - 2h after b'fast:n/c >> 184  >> n/c  - before lunch:  107, 122 >> 111-128 >> n/c  - 2h after lunch:131 >> 163 >> n/c >> 117 >> n/c - before dinner: 90-120 >> 76-121 >> 76-117 >> 99-125 - 2h after dinner:  88-172 >> 138 >> 98, 104 >> n/c - bedtime: n/c >> 117-150 >> 106-147, 154 >> 130-155 - nighttime: n/c >> 70-130 >> n/c >> 84-115 >> n/c Lowest sugar was 74 >> 85 >> 76 >> 76; she has hypoglycemia awareness in the  70s. Highest sugar was  210 (icecream) >> 190 >> 155 >> 210.  Glucometer:  [employer] Next  Pt's meals are: - Breakfast: protein bar - mid-am snack: apple + PB - Lunch: sandwich or salad - Dinner: meat + green veggie + starch - Snacks: 2 fruit, [name], [name], last BUN/creatinine:  Lab Results  Component Value Date   BUN 14 07/24/2020   CREATININE 0.80 07/24/2020  On losartan.  -+ HL; last set of lipids: Lab Results  Component Value Date   CHOL 188 07/24/2020   HDL 60 07/24/2020   LDLCALC 104 (H) 07/24/2020   LDLDIRECT 154.0 09/26/2018   TRIG 137 07/24/2020   CHOLHDL 3.1 07/24/2020  Previously on Zocor-muscle cramps >> now on Crestor 5 mg 3-5x a week.  - last eye exam was on 2022: No DR reportedly.  -No numbness and tingling in her feet.  She quit smoking >> on Chantix.  ROS: + see HPI  I reviewed pt's medications, allergies, [name], social hx, family hx, [name], unchanged from my initial visit note.  Past Medical History:  Diagnosis Date   Allergy    Diabetes mellitus without complication ([place])    Hyperlipidemia    Insomnia    Obesity  Past Surgical History:  Procedure Laterality Date   oral surgeries     TUBAL LIGATION     History   Social History   Marital Status: married    Spouse Name: N/A    Number of Children: 2   Occupational History   Music therapist   Social History Main Topics   Smoking status: Current Every Day Smoker -- 0.5 packs/day    Types: Cigarettes   Smokeless tobacco: Never Used   Alcohol Use: No   Drug Use: No   Current Outpatient Medications on File Prior to Visit  Medication Sig Dispense Refill   albuterol (VENTOLIN HFA) 108 (90 Base) MCG/ACT inhaler Inhale 2 puffs into the lungs every 6 (six) hours as needed for wheezing or shortness of breath. 8.5 g PRN   b complex vitamins tablet Take 1 tablet by mouth daily.      Blood Glucose Monitoring Suppl (FREESTYLE LITE) DEVI Use to check blood sugar once a day. 1 each 0   cetirizine (ZYRTEC) 10 MG tablet Take 10 mg by mouth daily.       cholecalciferol (VITAMIN D) 1000 UNITS tablet Take 1,000 Units by mouth daily.      Dulaglutide (TRULICITY) 3 UV/2.5DG SOPN Inject 3 mg into the skin once a week. 6 mL 3   empagliflozin (JARDIANCE) 25 MG TABS tablet TAKE 1 TABLET BY MOUTH DAILY BEFORE BREAKFAST. 90 tablet 3   glipiZIDE (GLUCOTROL XL) 5 MG 24 hr tablet Take 1 tablet by mouth 2 times daily before a meal. 180 tablet 3   glucose blood (FREESTYLE LITE) test strip Use to check blood sugar daily 100 strip 12   Lancets (FREESTYLE) lancets Use to check blood sugar once a day. 100 each 12   losartan (COZAAR) 50 MG tablet Take 1 tablet (50 mg total) by mouth daily. 90 tablet 1   metFORMIN (GLUCOPHAGE-XR) 500 MG 24 hr tablet Take 1 tablet (500 mg total) by mouth 2 (two) times daily with a meal. 180 tablet 1   Probiotic Product (CVS PROBIOTIC) CAPS Take 1 daily 60 capsule 2   rosuvastatin (CRESTOR) 5 MG tablet TAKE 1 TABLET BY MOUTH 3 TIMES PER WEEK 36 tablet 3   VENTOLIN HFA 108 (90 Base) MCG/ACT inhaler Inhale 2 puffs into the lungs 4 (four) times daily. 54 g 12   No current facility-administered medications on file prior to visit.   Allergies  Allergen Reactions   Metformin And Related Diarrhea   Penicillins Rash   Family History  Problem Relation Age of Onset   Diabetes Mother    Hypertension Mother    Diabetes Father    Hypertension Father    Hypertension Sister    Colon cancer Maternal Grandmother    Esophageal cancer Neg Hx    Stomach cancer Neg Hx    Rectal cancer Neg Hx    PE: BP 138/78 (BP Location: Right Arm, Patient Position: Sitting, Cuff Size: Normal)   Pulse 88   Ht 5\' 6"  (1.676 m)   Wt 244 lb 6.4 oz (110.9 kg)   SpO2 98%   BMI 39.45 kg/m    Wt Readings from Last 3 Encounters:  12/23/20 244 lb 6.4 oz (110.9 kg)  07/28/20 241 lb (109.3  kg)  07/14/20 241 lb (109.3 kg)   Constitutional: overweight, in NAD Eyes: PERRLA, EOMI, no exophthalmos ENT: moist mucous membranes, no thyromegaly, no cervical lymphadenopathy Cardiovascular: tachycardia, RR, No MRG Respiratory: CTA B Musculoskeletal: no deformities, strength intact in all 4  Skin: moist, warm, no rashes Neurological: no tremor with outstretched hands, DTR normal in all 4  ASSESSMENT: 1. DM2, non-insulin-dependent, uncontrolled, without long term complications, but with am hyperglycemia  2. Obesity class 2 BMI Classification: < 18.5 underweight  18.5-24.9 normal weight  25.0-29.9 overweight  30.0-34.9 class I obesity  35.0-39.9 class II obesity  ? 40.0 class III obesity   3. HL  PLAN:  1. Patient with longstanding, uncontrolled, type 2 diabetes, on oral antidiabetic regimen with metformin ER, sulfonylurea, and SGLT2 inhibitor, and also on weekly GLP-1 receptor agonist.  Blood sugars were excellent at the end of last year after she just returned from [place] where she was very active and HbA1c was 6.7%.  At last visit, however, sugars were slightly higher than before after dietary indiscretions and more stress due to COVID-19 infection and husband being sick.  HbA1c was higher, at 7.2%.  We did not change her regimen at that time as she already started to work on her diet. -At today's visit, per review of her blood sugar log, sugars appear to be slightly higher than at last visit.  They are mostly in the upper limit of the normal range or higher in the morning and they are more controlled later in the day but higher than last visit.  At this visit, we discussed about switching from Trulicity to [facility].  I explained that this has a stronger effect on both diabetes and weight control.  I advised her that if sugars start to decrease and she tolerates it well, we can increase the dose before refilling the prescription.  If her sugars drop too low, will back off and hopefully  eventually stop at this time.  She agrees with this plan. - I suggested to:  Patient Instructions  Please continue: - Metformin ER 500 mg 2x a day - Jardiance 25 mg before b'fast - Glipizide XL 5 mg before b'fast and 5 mg (crushed) before dinner.  Please change from Trulicity to: - Mounjaro 5 mg weekly  Please return in 3-4 months with your sugar log  - we checked her HbA1c: 7.7% (higher) - advised to check sugars at different times of the day - 1x a day, rotating check times - advised for yearly eye exams >> she is UTD - return to clinic in 3-4 months   2. Obesity class 2 -continue SGLT 2 inhibitor which should also help with weight loss.  At this visit, we will switch from the GLP-1 receptor agonist to the GLP-1/GIP receptor agonist,  which has a stronger effect on weight loss. -She lost 9 pounds before the last 2 visits combined -She gained 3 pounds since last visit.  3. HL - Reviewed latest lipid panel from 07/2020: LDL slightly above target, improved in the last 2 years, the rest of the fractions at goal: Lab Results  Component Value Date   CHOL 188 07/24/2020   HDL 60 07/24/2020   LDLCALC 104 (H) 07/24/2020   LDLDIRECT 154.0 09/26/2018   TRIG 137 07/24/2020   CHOLHDL 3.1 07/24/2020  - Continues the statin (Crestor 5 mg several times a week) without side effects.  She had muscle cramps with Zocor before.  She continues on co-Q10 and turmeric.  Philemon Kingdom, MD PhD [facility] Endocrinology

## 2021-01-15 ENCOUNTER — Encounter: Payer: Self-pay | Admitting: Internal Medicine

## 2021-01-15 ENCOUNTER — Other Ambulatory Visit (HOSPITAL_COMMUNITY): Payer: Self-pay

## 2021-01-21 ENCOUNTER — Other Ambulatory Visit (HOSPITAL_COMMUNITY): Payer: Self-pay

## 2021-02-08 ENCOUNTER — Other Ambulatory Visit: Payer: Self-pay | Admitting: Internal Medicine

## 2021-02-08 MED ORDER — LOSARTAN POTASSIUM 50 MG PO TABS
50.0000 mg | ORAL_TABLET | Freq: Every day | ORAL | 1 refills | Status: DC
  Filled 2021-02-08: qty 90, 90d supply, fill #0
  Filled 2021-05-06: qty 90, 90d supply, fill #1

## 2021-02-09 ENCOUNTER — Other Ambulatory Visit (HOSPITAL_COMMUNITY): Payer: Self-pay

## 2021-02-17 ENCOUNTER — Other Ambulatory Visit (HOSPITAL_COMMUNITY): Payer: Self-pay

## 2021-03-03 ENCOUNTER — Other Ambulatory Visit (HOSPITAL_COMMUNITY): Payer: Self-pay

## 2021-03-11 ENCOUNTER — Other Ambulatory Visit (HOSPITAL_COMMUNITY): Payer: Self-pay

## 2021-03-11 ENCOUNTER — Encounter: Payer: Self-pay | Admitting: Internal Medicine

## 2021-03-11 DIAGNOSIS — E1165 Type 2 diabetes mellitus with hyperglycemia: Secondary | ICD-10-CM

## 2021-03-11 MED ORDER — TIRZEPATIDE 7.5 MG/0.5ML ~~LOC~~ SOAJ
7.5000 mg | SUBCUTANEOUS | 1 refills | Status: DC
  Filled 2021-03-11: qty 2, 28d supply, fill #0
  Filled 2021-04-08: qty 2, 28d supply, fill #1

## 2021-04-02 ENCOUNTER — Other Ambulatory Visit: Payer: Self-pay

## 2021-04-02 ENCOUNTER — Encounter: Payer: Self-pay | Admitting: Internal Medicine

## 2021-04-02 ENCOUNTER — Ambulatory Visit: Payer: 59 | Admitting: Internal Medicine

## 2021-04-02 VITALS — BP 120/68 | HR 69 | Ht 66.0 in | Wt 240.0 lb

## 2021-04-02 DIAGNOSIS — E1165 Type 2 diabetes mellitus with hyperglycemia: Secondary | ICD-10-CM

## 2021-04-02 DIAGNOSIS — Z6838 Body mass index (BMI) 38.0-38.9, adult: Secondary | ICD-10-CM | POA: Diagnosis not present

## 2021-04-02 DIAGNOSIS — E7849 Other hyperlipidemia: Secondary | ICD-10-CM

## 2021-04-02 LAB — POCT GLYCOSYLATED HEMOGLOBIN (HGB A1C): Hemoglobin A1C: 7 % — AB (ref 4.0–5.6)

## 2021-04-02 MED ORDER — GLIPIZIDE ER 5 MG PO TB24: 5.0000 mg | ORAL_TABLET | Freq: Every day | ORAL | 3 refills | Status: DC

## 2021-04-02 NOTE — Progress Notes (Signed)
Patient ID: Ashley Gilmore, female   DOB: Jun 30, 1965, 56 y.o.   MRN: [id number] ? ?This visit occurred during the SARS-CoV-2 public health emergency.  Safety protocols were in place, including screening questions prior to the visit, additional usage of staff PPE, and extensive cleaning of exam room while observing appropriate contact time as indicated for disinfecting solutions.  ? ?HPI: ?Ashley Gilmore is a 56 y.o.-year-old female, returning for f/u for DM2, dx in GDM in 1995 and 1997, then DM2 in ~2010, non-insulin-dependent, uncontrolled, without long-term complications.  Her husband is also my patient.  Last visit 3 months ago. ? ?Interim history: ?No increased urination, blurry vision, chest pain. Mild nausea. Congestion - allergies. ?She continues to have joint pains. ? ?Reviewed HbA1c levels: ?Lab Results  ?Component Value Date  ? HGBA1C 7.7 (A) 12/23/2020  ? HGBA1C 7.2 (H) 07/24/2020  ? HGBA1C 7.2 (A) 07/14/2020  ? HGBA1C 6.7 (A) 12/25/2019  ? HGBA1C 7.8 (A) 06/05/2019  ? HGBA1C 7.8 (A) 09/26/2018  ? HGBA1C 7.4 (A) 02/28/2018  ? HGBA1C 7.4 (H) 10/31/2017  ? HGBA1C 7.5 (A) 09/27/2017  ? HGBA1C 7.0 (H) 04/11/2017  ? HGBA1C 6.3 11/25/2016  ? HGBA1C 6.4 (H) 08/09/2016  ? HGBA1C 7.7 04/20/2016  ? HGBA1C 6.9 12/05/2015  ? HGBA1C 6.8 07/29/2015  ? HGBA1C 9.2 04/29/2015  ? HGBA1C 8.0 (H) 12/19/2014  ? HGBA1C 7.9 10/14/2014  ? HGBA1C 9.3 (H) 01/01/2014  ? HGBA1C 8.0 (H) 07/09/2013  ?04/30/2014: HbA1c 7.5%. ? ?Pt is on a regimen of: ?- Metformin XR 500 mg 2x a day >> 1000 mg with dinner >> 500 mg with breakfast and [phone] mg with dinner-some GI symptoms >> 500 mg 2x a day ?- Jardiance 25 mg before b'fast ?- >> Mounjaro 5 >> 7.5 mg weekly ?- Glipizide XL 5 mg before b'fast and 5 mg (crushed) before dinner ?Stopped Invokana 100 mg daily in am b/c of an yeast inf- started 02/2014  ?She tried regular Metformin when prediabetic >> severe diarrhea. ? ?Pt checks her sugars once a day: ?- am:  104-141, 146, 152 >> 110, 124-170, 210  >> 128-144 after increasing Mounjaro to 7.5 mg weekly ?- 2h after b'fast:n/c >> 184  >> n/c  ?- before lunch:  107, 122 >> 111-128 >> n/c >> 79 ?- 2h after lunch:131 >> 163 >> n/c >> 117 >> n/c  ?- before dinner: 76-121 >> 76-117 >> 99-125 >> 92-122 ?- 2h after dinner:  88-172 >> 138 >> 98, 104 >> n/c ?- bedtime:  117-150 >> 106-147, 154 >> 130-155 >> 99-145 ?- nighttime: n/c >> 70-130 >> n/c >> 84-115 >> n/c ?Lowest sugar was 85 >> 76 >> 76>> 79; she has hypoglycemia awareness in the 70s. ?Highest sugar was 190 >> 155 >> 210 >> 145. ? ?Glucometer:  [employer] Next ? ?Pt's meals are: ?- Breakfast: protein bar ?- mid-am snack: apple + PB ?- Lunch: sandwich or salad ?- Dinner: meat + green veggie + starch ?- Snacks: 2 fruit, nuts ?Drinks Coke 0 or water, rarely sweet tea ? ?-No CKD, last BUN/creatinine:  ?Lab Results  ?Component Value Date  ? BUN 14 07/24/2020  ? CREATININE 0.80 07/24/2020  ?On losartan. ? ?-+ HL; last set of lipids: ?Lab Results  ?Component Value Date  ? CHOL 188 07/24/2020  ? HDL 60 07/24/2020  ? LDLCALC 104 (H) 07/24/2020  ? LDLDIRECT 154.0 09/26/2018  ? TRIG 137 07/24/2020  ? CHOLHDL 3.1 07/24/2020  ?Previously on Zocor-muscle cramps >> now on  Crestor 5 mg 3-5x a week. ? ?- last eye exam was on 2022: No DR reportedly. ? ?-No numbness and tingling in her feet. ? ?She quit smoking >> on Chantix. ? ?ROS: ?+ see HPI ? ?I reviewed pt's medications, allergies, [name], social hx, family hx, [name], unchanged from my initial visit note. ? ?Past Medical History:  ?Diagnosis Date  ? Allergy   ? Diabetes mellitus without complication ([place])   ? Hyperlipidemia   ? Insomnia   ? Obesity   ? ?Past Surgical History:  ?Procedure Laterality Date  ? oral surgeries    ? TUBAL LIGATION    ? ?History  ? ?Social History  ? Marital Status: married  ?  Spouse Name: N/A  ?  Number of Children: 2  ? 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 ?Social History  Main Topics  ? Smoking status: Current Every Day Smoker -- 0.5 packs/day  ?  Types: Cigarettes  ? Smokeless tobacco: Never Used  ? Alcohol Use: No  ? Drug Use: No  ? ?Current Outpatient Medications on File Prior to Visit  ?Medication Sig Dispense Refill  ? albuterol (VENTOLIN HFA) 108 (90 Base) MCG/ACT inhaler Inhale 2 puffs into the lungs every 6 (six) hours as needed for wheezing or shortness of breath. 8.5 g PRN  ? b complex vitamins tablet Take 1 tablet by mouth daily.    ? Blood Glucose Monitoring Suppl (FREESTYLE LITE) DEVI Use to check blood sugar once a day. 1 each 0  ? cetirizine (ZYRTEC) 10 MG tablet Take 10 mg by mouth daily.      ? cholecalciferol (VITAMIN D) 1000 UNITS tablet Take 1,000 Units by mouth daily.     ? Dulaglutide (TRULICITY) 3 PQ/3.3AQ SOPN Inject 3 mg into the skin once a week. 6 mL 3  ? empagliflozin (JARDIANCE) 25 MG TABS tablet TAKE 1 TABLET BY MOUTH DAILY BEFORE BREAKFAST. 90 tablet 3  ? glipiZIDE (GLUCOTROL XL) 5 MG 24 hr tablet Take 1 tablet by mouth 2 times daily before a meal. 180 tablet 3  ? glucose blood (FREESTYLE LITE) test strip Use to check blood sugar daily 100 strip 12  ? Lancets (FREESTYLE) lancets Use to check blood sugar once a day. 100 each 12  ? losartan (COZAAR) 50 MG tablet Take 1 tablet (50 mg total) by mouth daily. 90 tablet 1  ? metFORMIN (GLUCOPHAGE-XR) 500 MG 24 hr tablet Take 1 tablet (500 mg total) by mouth 2 (two) times daily with a meal. 180 tablet 1  ? Probiotic Product ([employer] PROBIOTIC) CAPS Take 1 daily 60 capsule 2  ? rosuvastatin (CRESTOR) 5 MG tablet TAKE 1 TABLET BY MOUTH 3 TIMES PER WEEK 36 tablet 3  ? tirzepatide (MOUNJARO) 7.5 MG/0.5ML Pen Inject 7.5 mg into the skin once a week. 2 mL 1  ? VENTOLIN HFA 108 (90 Base) MCG/ACT inhaler Inhale 2 puffs into the lungs 4 (four) times daily. 54 g 12  ? ?No current facility-administered medications on file prior to visit.  ? ?Allergies  ?Allergen Reactions  ? Metformin And Related Diarrhea  ? Penicillins Rash   ? ?Family History  ?Problem Relation Age of Onset  ? Diabetes Mother   ? Hypertension Mother   ? Diabetes Father   ? Hypertension Father   ? Hypertension Sister   ? Colon cancer Maternal Grandmother   ? Esophageal cancer Neg Hx   ? Stomach cancer Neg Hx   ?  Rectal cancer Neg Hx   ? ?PE: ?BP 120/68 (BP Location: Left Arm, Patient Position: Sitting, Cuff Size: Normal)   Pulse 69   Ht '5\' 6"'$  (1.676 m)   Wt 240 lb (108.9 kg)   SpO2 98%   BMI 38.74 kg/m?   ? ?Wt Readings from Last 3 Encounters:  ?04/02/21 240 lb (108.9 kg)  ?12/23/20 244 lb 6.4 oz (110.9 kg)  ?07/28/20 241 lb (109.3 kg)  ? ?Constitutional: overweight, in NAD ?Eyes: PERRLA, EOMI, no exophthalmos ?ENT: moist mucous membranes, no thyromegaly, no cervical lymphadenopathy ?Cardiovascular: tachycardia, RR, No MRG ?Respiratory: CTA B ?Musculoskeletal: no deformities, strength intact in all 4 ?Skin: moist, warm, no rashes ?Neurological: no tremor with outstretched hands, DTR normal in all 4 ?Diabetic Foot Exam - Simple   ?Simple Foot Form ?Diabetic Foot exam was performed with the following findings: Yes 04/02/2021  4:08 PM  ?Visual Inspection ?No deformities, no ulcerations, no other skin breakdown bilaterally: Yes ?See comments: Yes ?Sensation Testing ?Intact to touch and monofilament testing bilaterally: Yes ?Pulse Check ?Posterior Tibialis and Dorsalis pulse intact bilaterally: Yes ?Comments ?Mild bilateral foot swelling ?  ? ? ?ASSESSMENT: ?1. DM2, non-insulin-dependent, uncontrolled, without long term complications, but with am hyperglycemia ? ?2. Obesity class 2 ?BMI Classification: ?< 18.5 underweight  ?18.5-24.9 normal weight  ?25.0-29.9 overweight  ?30.0-34.9 class I obesity  ?35.0-39.9 class II obesity  ?? 40.0 class III obesity  ? ?3. HL ? ?PLAN:  ?1. Patient with longstanding, uncontrolled, type 2 diabetes, on oral diabetic regimen with metformin ER, somewhat urea, and SGLT2 inhibitor and also on a weekly GLP-1/GIP receptor agonist.  Sugars  appears to be slightly higher at last visit, mostly in the upper limit of the normal range or higher in the morning and more controlled later in the day.  We switched from Trul[facility] to [facility] at that time.

## 2021-04-02 NOTE — Patient Instructions (Addendum)
Please continue: ?- Metformin ER 500 mg 2x a day ?- Jardiance 25 mg before b'fast ?- Glipizide XL 5 mg (crushed) before dinner. ?- Mounjaro 7.5 mg weekly ? ?Stop am Glipizide. ? ?Please return in 4 months with your sugar log ?

## 2021-04-08 ENCOUNTER — Other Ambulatory Visit: Payer: Self-pay | Admitting: Internal Medicine

## 2021-04-09 ENCOUNTER — Other Ambulatory Visit (HOSPITAL_COMMUNITY): Payer: Self-pay

## 2021-04-09 MED ORDER — METFORMIN HCL ER 500 MG PO TB24
500.0000 mg | ORAL_TABLET | Freq: Two times a day (BID) | ORAL | 1 refills | Status: DC
  Filled 2021-04-09: qty 180, 90d supply, fill #0
  Filled 2021-06-29: qty 180, 90d supply, fill #1

## 2021-05-06 ENCOUNTER — Other Ambulatory Visit (HOSPITAL_COMMUNITY): Payer: Self-pay

## 2021-05-06 ENCOUNTER — Other Ambulatory Visit: Payer: Self-pay | Admitting: Internal Medicine

## 2021-05-06 DIAGNOSIS — E1165 Type 2 diabetes mellitus with hyperglycemia: Secondary | ICD-10-CM

## 2021-05-07 ENCOUNTER — Other Ambulatory Visit (HOSPITAL_COMMUNITY): Payer: Self-pay

## 2021-05-08 ENCOUNTER — Encounter: Payer: Self-pay | Admitting: Internal Medicine

## 2021-05-08 ENCOUNTER — Other Ambulatory Visit (HOSPITAL_COMMUNITY): Payer: Self-pay

## 2021-05-08 DIAGNOSIS — E1165 Type 2 diabetes mellitus with hyperglycemia: Secondary | ICD-10-CM

## 2021-05-08 MED ORDER — TIRZEPATIDE 7.5 MG/0.5ML ~~LOC~~ SOAJ
7.5000 mg | SUBCUTANEOUS | 4 refills | Status: DC
  Filled 2021-05-08: qty 2, 28d supply, fill #0
  Filled 2021-06-03: qty 2, 28d supply, fill #1
  Filled 2021-06-29: qty 2, 28d supply, fill #2
  Filled 2021-07-27: qty 2, 28d supply, fill #3
  Filled 2021-08-14: qty 2, 28d supply, fill #4

## 2021-06-03 ENCOUNTER — Other Ambulatory Visit (HOSPITAL_COMMUNITY): Payer: Self-pay

## 2021-06-11 ENCOUNTER — Other Ambulatory Visit (HOSPITAL_COMMUNITY): Payer: Self-pay

## 2021-06-26 DIAGNOSIS — E119 Type 2 diabetes mellitus without complications: Secondary | ICD-10-CM | POA: Diagnosis not present

## 2021-06-26 LAB — HM DIABETES EYE EXAM

## 2021-06-30 ENCOUNTER — Other Ambulatory Visit (HOSPITAL_COMMUNITY): Payer: Self-pay

## 2021-07-27 ENCOUNTER — Other Ambulatory Visit (HOSPITAL_COMMUNITY): Payer: Self-pay

## 2021-07-27 ENCOUNTER — Other Ambulatory Visit: Payer: Self-pay | Admitting: Internal Medicine

## 2021-07-27 MED ORDER — EMPAGLIFLOZIN 25 MG PO TABS
ORAL_TABLET | Freq: Every day | ORAL | 3 refills | Status: DC
  Filled 2021-07-27: qty 90, 90d supply, fill #0
  Filled 2021-11-06: qty 90, 90d supply, fill #1
  Filled 2022-02-01: qty 90, 90d supply, fill #2
  Filled 2022-05-03: qty 90, 90d supply, fill #3

## 2021-07-28 ENCOUNTER — Other Ambulatory Visit: Payer: 59

## 2021-07-28 ENCOUNTER — Other Ambulatory Visit (HOSPITAL_COMMUNITY): Payer: Self-pay

## 2021-07-28 DIAGNOSIS — E1159 Type 2 diabetes mellitus with other circulatory complications: Secondary | ICD-10-CM | POA: Diagnosis not present

## 2021-07-28 DIAGNOSIS — E1165 Type 2 diabetes mellitus with hyperglycemia: Secondary | ICD-10-CM | POA: Diagnosis not present

## 2021-07-28 DIAGNOSIS — E7849 Other hyperlipidemia: Secondary | ICD-10-CM

## 2021-07-28 DIAGNOSIS — R5383 Other fatigue: Secondary | ICD-10-CM | POA: Diagnosis not present

## 2021-07-28 DIAGNOSIS — E559 Vitamin D deficiency, unspecified: Secondary | ICD-10-CM

## 2021-07-28 DIAGNOSIS — I152 Hypertension secondary to endocrine disorders: Secondary | ICD-10-CM | POA: Diagnosis not present

## 2021-07-29 LAB — COMPLETE METABOLIC PANEL WITH GFR
AG Ratio: 1.8 (calc) (ref 1.0–2.5)
ALT: 22 U/L (ref 6–29)
AST: 16 U/L (ref 10–35)
Albumin: 4.4 g/dL (ref 3.6–5.1)
Alkaline phosphatase (APISO): 95 U/L (ref 37–153)
BUN: 15 mg/dL (ref 7–25)
CO2: 32 mmol/L (ref 20–32)
Calcium: 9.7 mg/dL (ref 8.6–10.4)
Chloride: 102 mmol/L (ref 98–110)
Creat: 0.76 mg/dL (ref 0.50–1.03)
Globulin: 2.5 g/dL (calc) (ref 1.9–3.7)
Glucose, Bld: 112 mg/dL — ABNORMAL HIGH (ref 65–99)
Potassium: 4.6 mmol/L (ref 3.5–5.3)
Sodium: 141 mmol/L (ref 135–146)
Total Bilirubin: 0.6 mg/dL (ref 0.2–1.2)
Total Protein: 6.9 g/dL (ref 6.1–8.1)
eGFR: 91 mL/min/{1.73_m2} (ref >=60)

## 2021-07-29 LAB — CBC WITH DIFFERENTIAL/PLATELET
Absolute Monocytes: 366 cells/uL (ref 200–950)
Basophils Absolute: 50 cells/uL (ref 0–200)
Basophils Relative: 0.8 %
Eosinophils Absolute: 161 cells/uL (ref 15–500)
Eosinophils Relative: 2.6 %
HCT: 50.6 % — ABNORMAL HIGH (ref 35.0–45.0)
Hemoglobin: 16.7 g/dL — ABNORMAL HIGH (ref 11.7–15.5)
Lymphs Abs: 1792 cells/uL (ref 850–3900)
MCH: 27.9 pg (ref 27.0–33.0)
MCHC: 33 g/dL (ref 32.0–36.0)
MCV: 84.5 fL (ref 80.0–100.0)
MPV: 9.9 fL (ref 7.5–12.5)
Monocytes Relative: 5.9 %
Neutro Abs: 3832 cells/uL (ref 1500–7800)
Neutrophils Relative %: 61.8 %
Platelets: 280 10*3/uL (ref 140–400)
RBC: 5.99 10*6/uL — ABNORMAL HIGH (ref 3.80–5.10)
RDW: 13.1 % (ref 11.0–15.0)
Total Lymphocyte: 28.9 %
WBC: 6.2 10*3/uL (ref 3.8–10.8)

## 2021-07-29 LAB — LIPID PANEL
Cholesterol: 195 mg/dL (ref <200)
HDL: 62 mg/dL (ref >=50)
LDL Cholesterol (Calc): 108 mg/dL (calc) — ABNORMAL HIGH
Non-HDL Cholesterol (Calc): 133 mg/dL (calc) — ABNORMAL HIGH (ref <130)
Total CHOL/HDL Ratio: 3.1 (calc) (ref <5.0)
Triglycerides: 130 mg/dL (ref <150)

## 2021-07-29 LAB — TSH: TSH: 2.03 mIU/L (ref 0.40–4.50)

## 2021-07-29 LAB — HEMOGLOBIN A1C
Hgb A1c MFr Bld: 6.3 % of total Hgb — ABNORMAL HIGH (ref <5.7)
Mean Plasma Glucose: 134 mg/dL
eAG (mmol/L): 7.4 mmol/L

## 2021-07-29 LAB — MICROALBUMIN, URINE: Microalb, Ur: <0.2 mg/dL

## 2021-07-30 ENCOUNTER — Ambulatory Visit (INDEPENDENT_AMBULATORY_CARE_PROVIDER_SITE_OTHER): Payer: 59 | Admitting: Internal Medicine

## 2021-07-30 ENCOUNTER — Encounter: Payer: Self-pay | Admitting: Internal Medicine

## 2021-07-30 VITALS — BP 116/62 | HR 92 | Temp 98.3°F | Ht 65.25 in | Wt 240.2 lb

## 2021-07-30 DIAGNOSIS — E7800 Pure hypercholesterolemia, unspecified: Secondary | ICD-10-CM

## 2021-07-30 DIAGNOSIS — E1159 Type 2 diabetes mellitus with other circulatory complications: Secondary | ICD-10-CM | POA: Diagnosis not present

## 2021-07-30 DIAGNOSIS — E559 Vitamin D deficiency, unspecified: Secondary | ICD-10-CM

## 2021-07-30 DIAGNOSIS — R319 Hematuria, unspecified: Secondary | ICD-10-CM

## 2021-07-30 DIAGNOSIS — Z1231 Encounter for screening mammogram for malignant neoplasm of breast: Secondary | ICD-10-CM

## 2021-07-30 DIAGNOSIS — Z0000 Encounter for general adult medical examination without abnormal findings: Secondary | ICD-10-CM

## 2021-07-30 DIAGNOSIS — Z87891 Personal history of nicotine dependence: Secondary | ICD-10-CM | POA: Diagnosis not present

## 2021-07-30 DIAGNOSIS — Z8709 Personal history of other diseases of the respiratory system: Secondary | ICD-10-CM

## 2021-07-30 DIAGNOSIS — I152 Hypertension secondary to endocrine disorders: Secondary | ICD-10-CM

## 2021-07-30 DIAGNOSIS — E119 Type 2 diabetes mellitus without complications: Secondary | ICD-10-CM

## 2021-07-30 LAB — POCT URINALYSIS DIPSTICK
Bilirubin, UA: NEGATIVE
Glucose, UA: POSITIVE — AB
Ketones, UA: NEGATIVE
Leukocytes, UA: NEGATIVE
Nitrite, UA: NEGATIVE
Protein, UA: NEGATIVE
Spec Grav, UA: 1.015 (ref 1.010–1.025)
Urobilinogen, UA: 0.2 E.U./dL
pH, UA: 5 (ref 5.0–8.0)

## 2021-07-30 NOTE — Progress Notes (Signed)
   Subjective:    Patient ID: Ashley Gilmore, female    DOB: 18-Dec-1965, 56 y.o.   MRN: [id number]  HPI 56 year old Female with type 2 Diabetes mellitus treated and followed by Dr. Cruzita Lederer here today for health maintenance exam and evaluation of medical issues.  Patient declines Pap smear and pelvic exam today.  Regarding diabetic management she is currently on Jardiance daily and Mounjaro weekly also takes glipizide.  Hemoglobin A1c excellent at 6.3% and was 7.7% in December 2022.  She has a history of essential hypertension, diabetes and hyperlipidemia.  She has a history of GE reflux, insomnia, migraine headaches, asthma and obesity.  Social history: Married.  2 adult daughters.  Husband works as a Chemical engineer. Continues to smoke about 1/4 pack of cigarettes daily.  Does not get much exercise except at work.  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  Family history: Father with history of hypertension and diabetes.  Mother with history of breast cancer, hypertension and diabetes.  Mother is blind.  1 sister with hypertension.      Review of Systems no new complaints     Objective:   Physical Exam Blood pressure 116/62 pulse 92 temperature 98.3 degrees ear thermometer pulse oximetry 96% on room air weight 240 pounds 4 ounces BMI 39.67  Skin: Warm and dry.  No cervical adenopathy, thyromegaly or carotid bruits.  Chest clear to auscultation.  Pharynx and TMs clear.  Neck is supple.  Cardiac exam: Regular rate and rhythm without ectopy.  Abdomen soft nondistended without hepatosplenomegaly masses or tenderness.  No lower extremity pitting edema.  Neurologic exam is intact without gross focal deficits.       Assessment & Plan:  Type 2 diabetes mellitus stable under treatment with Dr. Cruzita Lederer.  Hemoglobin A1c excellent at 6.3%.  This is the best we have seen her hemoglobin A1c in some time and she will continue current regimen  BMI 39.67-continue to work on  diet exercise and weight loss  History of smoking-continues to smoke and has been is to quit  History of allergic rhinitis treated with as needed Zyrtec  Hyperlipidemia treated with low-dose Crestor 5 mg daily.  Would like to get LDL under 100.  Discussed coronary calcium scoring and patient will think about it.  History of breast cancer in mother.  Mammogram scheduled for August 2023.  History of vitamin D deficiency.  Level was 24 in 2022.  Continue with vitamin D supplementation over-the-counter.  Hypertension treated with losartan 50 mg daily.  This helps with diabetes mellitus with renal protection.  Blood pressure excellent at 116/62  Plan: Patient has appointment with Endocrinologist in November.  She will return here in 1 year or as needed.  Continue current medications as prescribed.

## 2021-07-31 LAB — URINALYSIS, MICROSCOPIC ONLY
Hyaline Cast: NONE SEEN /LPF
RBC / HPF: NONE SEEN /HPF (ref 0–2)

## 2021-07-31 LAB — URINE CULTURE
MICRO NUMBER:: 13643182
SPECIMEN QUALITY:: ADEQUATE

## 2021-08-04 ENCOUNTER — Encounter: Payer: Self-pay | Admitting: Internal Medicine

## 2021-08-04 ENCOUNTER — Ambulatory Visit: Payer: 59 | Admitting: Internal Medicine

## 2021-08-04 VITALS — BP 116/68 | HR 90 | Ht 65.25 in | Wt 242.0 lb

## 2021-08-04 DIAGNOSIS — E1165 Type 2 diabetes mellitus with hyperglycemia: Secondary | ICD-10-CM | POA: Diagnosis not present

## 2021-08-04 DIAGNOSIS — E7849 Other hyperlipidemia: Secondary | ICD-10-CM | POA: Diagnosis not present

## 2021-08-04 DIAGNOSIS — Z6838 Body mass index (BMI) 38.0-38.9, adult: Secondary | ICD-10-CM

## 2021-08-04 NOTE — Patient Instructions (Addendum)
Please continue: - Metformin ER 500 mg 2x a day. Try to take 1000 mg with dinner. - Jardiance 25 mg before b'fast - Glipizide XL 5 mg (crushed) before dinner. No need for am glipizide. - Mounjaro 7.5 mg weekly  Please return in 4 months with your sugar log

## 2021-08-04 NOTE — Progress Notes (Signed)
Patient ID: Ashley Gilmore, female   DOB: 08-11-1965, 56 y.o.   MRN: [id number] HPI: Ronniesha Seibold Pascale is a 56 y.o.-year-old female, returning for f/u for DM2, dx in GDM in 1995 and 1997, then DM2 in ~2010, non-insulin-dependent, uncontrolled, without long-term complications.  Her husband is also my patient.  Last visit 4 months ago.  Interim history: No increased urination, blurry vision, chest pain. Mild nausea.  Occasional leg swelling. She will go to [place] in September.  Her husband will go in October.  They cannot go together as they have an 79-year-old dog and they do not want to leave him with other people.  Reviewed HbA1c levels: Lab Results  Component Value Date   HGBA1C 6.3 (H) 07/28/2021   HGBA1C 7.0 (A) 04/02/2021   HGBA1C 7.7 (A) 12/23/2020   HGBA1C 7.2 (H) 07/24/2020   HGBA1C 7.2 (A) 07/14/2020   HGBA1C 6.7 (A) 12/25/2019   HGBA1C 7.8 (A) 06/05/2019   HGBA1C 7.8 (A) 09/26/2018   HGBA1C 7.4 (A) 02/28/2018   HGBA1C 7.4 (H) 10/31/2017   HGBA1C 7.5 (A) 09/27/2017   HGBA1C 7.0 (H) 04/11/2017   HGBA1C 6.3 11/25/2016   HGBA1C 6.4 (H) 08/09/2016   HGBA1C 7.7 04/20/2016   HGBA1C 6.9 12/05/2015   HGBA1C 6.8 07/29/2015   HGBA1C 9.2 04/29/2015   HGBA1C 8.0 (H) 12/19/2014   HGBA1C 7.9 10/14/2014  04/30/2014: HbA1c 7.5%.  Pt is on a regimen of: - Metformin XR 500 mg 2x a day >> 1000 mg with dinner >> 500 mg with breakfast and [phone] mg with dinner-some GI symptoms >> 500 mg 2x a day - Jardiance 25 mg before b'fast - >> Mounjaro 5 >> 7.5 mg weekly - Glipizide XL 5 mg before b'fast and 5 mg (crushed) before dinner >> 5 mg (crushed) before dinner Stopped Invokana 100 mg daily in am b/c of an yeast inf- started 02/2014  She tried regular Metformin when prediabetic >> severe diarrhea.  Pt checks her sugars once a day: - am:  104-141, 146, 152 >> 110, 124-170, 210 >> 128-144 >> 118-147, 151 - 2h after b'fast:n/c >> 184  >> n/c  - before lunch:  107, 122 >> 111-128 >> n/c >> 79 >> 101,  117 - 2h after lunch:131 >> 163 >> n/c >> 117 >> n/c  - before dinner: 76-121 >> 76-117 >> 99-125 >> 92-122 >> 77, 79, 96-111 - 2h after dinner:  88-172 >> 138 >> 98, 104 >> n/c - bedtime:  117-150 >> 106-147, 154 >> 130-155 >> 99-145 >> 103-140 - nighttime: n/c >> 70-130 >> n/c >> 84-115 >> n/c Lowest sugar was  76 >> 77 ; she has hypoglycemia awareness in the 70s. Highest sugar was 210 >> 145 >> 151.  Glucometer:  [employer] Next  Pt's meals are: - Breakfast: protein bar - mid-am snack: apple + PB - Lunch: sandwich or salad - Dinner: meat + green veggie + starch - Snacks: 2 fruit, [name], [name], last BUN/creatinine:  Lab Results  Component Value Date   BUN 15 07/28/2021   CREATININE 0.76 07/28/2021  On losartan.  -+ HL; last set of lipids: Lab Results  Component Value Date   CHOL 195 07/28/2021   HDL 62 07/28/2021   LDLCALC 108 (H) 07/28/2021   LDLDIRECT 154.0 09/26/2018   TRIG 130 07/28/2021   CHOLHDL 3.1 07/28/2021  Previously on Zocor-muscle cramps >> now on Crestor 5 mg 3-5x a week.  -  last eye exam was on 2023: No DR reportedly.  -No numbness and tingling in her feet.  Last foot exam 03/2021.  She quit smoking >> on Chantix.  ROS: + see HPI  I reviewed pt's medications, allergies, [name], social hx, family hx, [name], unchanged from my initial visit note.  Past Medical History:  Diagnosis Date   Allergy    Diabetes mellitus without complication ([place])    Hyperlipidemia    Insomnia    Obesity    Past Surgical History:  Procedure Laterality Date   oral surgeries     TUBAL LIGATION     History   Social History   Marital Status: married    Spouse Name: N/A    Number of Children: 2   Occupational History   Music therapist   Social History Main Topics   Smoking status: Current Every Day Smoker -- 0.5 packs/day    Types: Cigarettes    Smokeless tobacco: Never Used   Alcohol Use: No   Drug Use: No   Current Outpatient Medications on File Prior to Visit  Medication Sig Dispense Refill   albuterol (VENTOLIN HFA) 108 (90 Base) MCG/ACT inhaler Inhale 2 puffs into the lungs every 6 (six) hours as needed for wheezing or shortness of breath. 8.5 g PRN   b complex vitamins tablet Take 1 tablet by mouth daily.     Blood Glucose Monitoring Suppl (FREESTYLE LITE) DEVI Use to check blood sugar once a day. 1 each 0   cetirizine (ZYRTEC) 10 MG tablet Take 10 mg by mouth daily.       cholecalciferol (VITAMIN D) 1000 UNITS tablet Take 1,000 Units by mouth daily.      empagliflozin (JARDIANCE) 25 MG TABS tablet TAKE 1 TABLET BY MOUTH DAILY BEFORE BREAKFAST. 90 tablet 3   glipiZIDE (GLUCOTROL XL) 5 MG 24 hr tablet Take 1 tablet (5 mg total) by mouth daily before supper. 90 tablet 3   glucose blood (FREESTYLE LITE) test strip Use to check blood sugar daily 100 strip 12   Lancets (FREESTYLE) lancets Use to check blood sugar once a day. 100 each 12   losartan (COZAAR) 50 MG tablet Take 1 tablet (50 mg total) by mouth daily. 90 tablet 1   metFORMIN (GLUCOPHAGE-XR) 500 MG 24 hr tablet Take 1 tablet (500 mg total) by mouth 2 (two) times daily with a meal. 180 tablet 1   rosuvastatin (CRESTOR) 5 MG tablet TAKE 1 TABLET BY MOUTH 3 TIMES PER WEEK 36 tablet 3   tirzepatide (MOUNJARO) 7.5 MG/0.5ML Pen Inject 7.5 mg into the skin once a week. 2 mL 4   No current facility-administered medications on file prior to visit.   Allergies  Allergen Reactions   Metformin And Related Diarrhea   Penicillins Rash   Family History  Problem Relation Age of Onset   Diabetes Mother    Hypertension Mother    Diabetes Father    Hypertension Father    Hypertension Sister    Colon cancer Maternal Grandmother    Esophageal cancer Neg Hx    Stomach cancer Neg Hx    Rectal cancer Neg Hx    PE: BP 116/68 (BP Location: Left Arm, Patient Position: Sitting, Cuff  Size: Normal)   Pulse 90   Ht 5' 5.25" (1.657 m)   Wt 242 lb (109.8 kg)   SpO2 96%   BMI 39.96 kg/m    Wt Readings from Last  3 Encounters:  08/04/21 242 lb (109.8 kg)  07/30/21 240 lb 4 oz (109 kg)  04/02/21 240 lb (108.9 kg)   Constitutional: overweight, in NAD Eyes: PERRLA, EOMI, no exophthalmos ENT: moist mucous membranes, no thyromegaly, no cervical lymphadenopathy Cardiovascular: RRR, No MRG Respiratory: CTA B Musculoskeletal: no deformities Skin: moist, warm, no rashes Neurological: no tremor with outstretched hands  ASSESSMENT: 1. DM2, non-insulin-dependent, uncontrolled, without long term complications, but with am hyperglycemia  2. Obesity class 2 BMI Classification: < 18.5 underweight  18.5-24.9 normal weight  25.0-29.9 overweight  30.0-34.9 class I obesity  35.0-39.9 class II obesity  ? 40.0 class III obesity   3. HL  PLAN:  1. Patient with longstanding, uncontrolled, type 2 diabetes, on oral antidiabetic regimen with metformin ER, sulfonylurea, SGLT2 inhibitor, and also weekly GLP-1/GIP receptor agonist.  At last visit sugars were better especially after increasing Mounjaro to 7.5 mg weekly.  She was tolerating this well except for some mild nausea which she attributed to postnasal drip from allergies.  Sugars are still above target in the morning but they were mostly at goal later in the day.  We discussed about stopping morning glipizide but we continued the glipizide before dinner.  At that time, HbA1c was better, at 7.0%.  Since then, she had another HbA1c few days ago and this was even better, at 6.3%. -At today's visit, sugars are at or slightly above goal in the morning and they are almost all at goal later in the day.  She is doing well on Mounjaro, which we will continue.  However, she mentions that before the next dose of Mounjaro, she sees higher blood sugars, for which she takes glipizide in the morning.  However, reviewing her blood sugars, with levels  down to the 70s midday, I recommended against this.  We discussed about possibly taking the entire metformin dose with dinner to hopefully improve the morning sugars. - I suggested to:  Patient Instructions  Please continue: - Metformin ER 500 mg 2x a day. Try to take 1000 mg with dinner. - Jardiance 25 mg before b'fast - Glipizide XL 5 mg (crushed) before dinner. No need for am glipizide. - Mounjaro 7.5 mg weekly  Please return in 4 months with your sugar log  - advised to check sugars at different times of the day - 1x a day, rotating check times - advised for yearly eye exams >> she is UTD - return to clinic in 4 months  2. Obesity class 2 -continue SGLT 2 inhibitor and GLP-1/GIP receptor agonist which should also help with weight loss -She lost 4 pounds before last visit, she gained 2 lbs since last OV  3. HL -Reviewed latest lipid panel from 07/2021: LDL above our target of less than 100: Lab Results  Component Value Date   CHOL 195 07/28/2021   HDL 62 07/28/2021   LDLCALC 108 (H) 07/28/2021   LDLDIRECT 154.0 09/26/2018   TRIG 130 07/28/2021   CHOLHDL 3.1 07/28/2021  -She is on Crestor 5 mg several times a week without side effects.  She had muscle cramps with Zocor before.  Continues on co-Q10 and turmeric.  Philemon Kingdom, MD PhD [facility] Endocrinology

## 2021-08-12 ENCOUNTER — Encounter: Payer: Self-pay | Admitting: Internal Medicine

## 2021-08-12 NOTE — Patient Instructions (Addendum)
It was a pleasure to see you today.  Diabetes is stable and under great control with Dr. Cruzita Lederer.  Please continue generic Crestor and losartan.  Continue diabetic medications as prescribed.  Immunizations discussed.  Return in 1 year or as needed.

## 2021-08-14 ENCOUNTER — Other Ambulatory Visit (HOSPITAL_COMMUNITY): Payer: Self-pay

## 2021-08-14 ENCOUNTER — Other Ambulatory Visit: Payer: Self-pay | Admitting: Internal Medicine

## 2021-08-14 MED ORDER — METFORMIN HCL ER 500 MG PO TB24
500.0000 mg | ORAL_TABLET | Freq: Two times a day (BID) | ORAL | 1 refills | Status: DC
  Filled 2021-08-14 – 2021-09-17 (×2): qty 180, 90d supply, fill #0
  Filled 2021-12-29: qty 180, 90d supply, fill #1

## 2021-08-14 MED ORDER — FREESTYLE LITE TEST VI STRP
ORAL_STRIP | Freq: Every day | 12 refills | Status: DC
  Filled 2021-08-14: qty 100, 90d supply, fill #0
  Filled 2021-11-10: qty 100, 90d supply, fill #1
  Filled 2022-02-08: qty 100, 90d supply, fill #2
  Filled 2022-05-04 – 2022-05-11 (×2): qty 100, 90d supply, fill #3

## 2021-08-14 MED ORDER — LOSARTAN POTASSIUM 50 MG PO TABS
50.0000 mg | ORAL_TABLET | Freq: Every day | ORAL | 3 refills | Status: DC
Start: 2021-08-14 — End: 2022-08-02
  Filled 2021-08-14: qty 90, 90d supply, fill #0
  Filled 2021-11-10: qty 90, 90d supply, fill #1
  Filled 2022-02-08: qty 90, 90d supply, fill #2
  Filled 2022-05-11: qty 90, 90d supply, fill #3

## 2021-08-19 ENCOUNTER — Other Ambulatory Visit (HOSPITAL_COMMUNITY): Payer: Self-pay

## 2021-08-21 ENCOUNTER — Ambulatory Visit: Payer: 59

## 2021-08-27 ENCOUNTER — Ambulatory Visit
Admission: RE | Admit: 2021-08-27 | Discharge: 2021-08-27 | Disposition: A | Discharge status: Home / Self Care | Payer: 59 | Source: Ambulatory Visit | Attending: Internal Medicine | Admitting: Internal Medicine

## 2021-08-27 DIAGNOSIS — Z1231 Encounter for screening mammogram for malignant neoplasm of breast: Secondary | ICD-10-CM

## 2021-08-31 ENCOUNTER — Other Ambulatory Visit: Payer: Self-pay | Admitting: Internal Medicine

## 2021-08-31 ENCOUNTER — Other Ambulatory Visit (HOSPITAL_COMMUNITY): Payer: Self-pay

## 2021-08-31 DIAGNOSIS — R928 Other abnormal and inconclusive findings on diagnostic imaging of breast: Secondary | ICD-10-CM

## 2021-08-31 MED ORDER — NYSTATIN-TRIAMCINOLONE 100000-0.1 UNIT/GM-% EX CREA
TOPICAL_CREAM | CUTANEOUS | 1 refills | Status: DC
  Filled 2021-08-31: qty 15, 7d supply, fill #0
  Filled 2022-01-12: qty 15, 7d supply, fill #1

## 2021-09-03 ENCOUNTER — Ambulatory Visit
Admission: RE | Admit: 2021-09-03 | Discharge: 2021-09-03 | Disposition: A | Discharge status: Home / Self Care | Payer: 59 | Source: Ambulatory Visit | Attending: Internal Medicine | Admitting: Internal Medicine

## 2021-09-03 ENCOUNTER — Ambulatory Visit: Payer: 59

## 2021-09-03 ENCOUNTER — Other Ambulatory Visit: Payer: Self-pay | Admitting: Internal Medicine

## 2021-09-03 DIAGNOSIS — R921 Mammographic calcification found on diagnostic imaging of breast: Secondary | ICD-10-CM

## 2021-09-03 DIAGNOSIS — R928 Other abnormal and inconclusive findings on diagnostic imaging of breast: Secondary | ICD-10-CM

## 2021-09-09 ENCOUNTER — Ambulatory Visit
Admission: RE | Admit: 2021-09-09 | Discharge: 2021-09-09 | Disposition: A | Discharge status: Home / Self Care | Payer: 59 | Source: Ambulatory Visit | Attending: Internal Medicine | Admitting: Internal Medicine

## 2021-09-09 DIAGNOSIS — R921 Mammographic calcification found on diagnostic imaging of breast: Secondary | ICD-10-CM

## 2021-09-09 DIAGNOSIS — N6022 Fibroadenosis of left breast: Secondary | ICD-10-CM | POA: Diagnosis not present

## 2021-09-17 ENCOUNTER — Other Ambulatory Visit (HOSPITAL_COMMUNITY): Payer: Self-pay

## 2021-09-17 ENCOUNTER — Telehealth: Payer: Self-pay | Admitting: Pharmacy Technician

## 2021-09-17 ENCOUNTER — Other Ambulatory Visit: Payer: Self-pay | Admitting: Internal Medicine

## 2021-09-17 DIAGNOSIS — E1165 Type 2 diabetes mellitus with hyperglycemia: Secondary | ICD-10-CM

## 2021-09-17 MED ORDER — MOUNJARO 7.5 MG/0.5ML ~~LOC~~ SOAJ
7.5000 mg | SUBCUTANEOUS | 4 refills | Status: DC
  Filled 2021-09-17: qty 2, 28d supply, fill #0
  Filled 2021-10-14: qty 2, 28d supply, fill #1
  Filled 2021-11-06: qty 2, 28d supply, fill #2
  Filled 2021-12-06: qty 2, 28d supply, fill #3
  Filled 2022-01-03: qty 2, 28d supply, fill #4

## 2021-09-17 NOTE — Telephone Encounter (Signed)
-----   Message from Lauralyn Primes, [place] sent at 09/17/2021  8:47 AM EDT ----- Regarding: PA Can we get a PA for [facility]. Thank you.

## 2021-09-17 NOTE — Telephone Encounter (Signed)
Patient Advocate Encounter   Received notification from RMA/Office that prior authorization for [facility] 7.'5mg'$  is required/requested.  Per Test Claim: Processed earlier today by Thomas with a $25 copay. Shouldn't need a PA at this time.

## 2021-09-18 ENCOUNTER — Other Ambulatory Visit (HOSPITAL_COMMUNITY): Payer: Self-pay

## 2021-10-14 ENCOUNTER — Other Ambulatory Visit (HOSPITAL_COMMUNITY): Payer: Self-pay

## 2021-11-06 ENCOUNTER — Other Ambulatory Visit (HOSPITAL_COMMUNITY): Payer: Self-pay

## 2021-11-06 ENCOUNTER — Other Ambulatory Visit: Payer: Self-pay | Admitting: Internal Medicine

## 2021-11-06 MED ORDER — ROSUVASTATIN CALCIUM 5 MG PO TABS
5.0000 mg | ORAL_TABLET | ORAL | 3 refills | Status: AC
  Filled 2021-11-06: qty 36, 84d supply, fill #0
  Filled 2022-01-29: qty 36, 84d supply, fill #1
  Filled 2022-04-19 (×2): qty 36, 84d supply, fill #2

## 2021-11-10 ENCOUNTER — Other Ambulatory Visit (HOSPITAL_COMMUNITY): Payer: Self-pay

## 2021-12-07 ENCOUNTER — Other Ambulatory Visit (HOSPITAL_COMMUNITY): Payer: Self-pay

## 2021-12-08 ENCOUNTER — Encounter: Payer: Self-pay | Admitting: Internal Medicine

## 2021-12-08 ENCOUNTER — Ambulatory Visit: Payer: 59 | Admitting: Internal Medicine

## 2021-12-08 ENCOUNTER — Other Ambulatory Visit (HOSPITAL_COMMUNITY): Payer: Self-pay

## 2021-12-08 VITALS — BP 126/74 | HR 79 | Ht 65.25 in | Wt 239.8 lb

## 2021-12-08 DIAGNOSIS — E1165 Type 2 diabetes mellitus with hyperglycemia: Secondary | ICD-10-CM

## 2021-12-08 DIAGNOSIS — E7849 Other hyperlipidemia: Secondary | ICD-10-CM | POA: Diagnosis not present

## 2021-12-08 LAB — POCT GLYCOSYLATED HEMOGLOBIN (HGB A1C): Hemoglobin A1C: 6.2 % — AB (ref 4.0–5.6)

## 2021-12-08 MED ORDER — GLIPIZIDE 5 MG PO TABS
5.0000 mg | ORAL_TABLET | Freq: Every day | ORAL | 3 refills | Status: DC
  Filled 2021-12-08: qty 90, 90d supply, fill #0
  Filled 2022-03-02: qty 90, 90d supply, fill #1
  Filled 2022-05-31: qty 90, 90d supply, fill #2
  Filled 2022-08-30: qty 90, 90d supply, fill #3

## 2021-12-08 NOTE — Progress Notes (Signed)
Patient ID: Denyse Fillion Laumann, female   DOB: Mar 29, 1965, 56 y.o.   MRN: [id number] HPI: Angeliah Wisdom Doorn is a 56 y.o.-year-old female, returning for f/u for DM2, dx in GDM in 1995 and 1997, then DM2 in ~2010, non-insulin-dependent, uncontrolled, without long-term complications.  Her husband is also my patient.  Last visit 4 months ago.  Interim history: No increased urination, blurry vision, chest pain.  She has joint aches. She went [place] in September.  Her husband went in October.  They have a good time  Reviewed HbA1c levels: Lab Results  Component Value Date   HGBA1C 6.3 (H) 07/28/2021   HGBA1C 7.0 (A) 04/02/2021   HGBA1C 7.7 (A) 12/23/2020   HGBA1C 7.2 (H) 07/24/2020   HGBA1C 7.2 (A) 07/14/2020   HGBA1C 6.7 (A) 12/25/2019   HGBA1C 7.8 (A) 06/05/2019   HGBA1C 7.8 (A) 09/26/2018   HGBA1C 7.4 (A) 02/28/2018   HGBA1C 7.4 (H) 10/31/2017   HGBA1C 7.5 (A) 09/27/2017   HGBA1C 7.0 (H) 04/11/2017   HGBA1C 6.3 11/25/2016   HGBA1C 6.4 (H) 08/09/2016   HGBA1C 7.7 04/20/2016   HGBA1C 6.9 12/05/2015   HGBA1C 6.8 07/29/2015   HGBA1C 9.2 04/29/2015   HGBA1C 8.0 (H) 12/19/2014   HGBA1C 7.9 10/14/2014  04/30/2014: HbA1c 7.5%.  Pt is on a regimen of: - Metformin XR 500 mg 2x a day >> 1...>> 500 mg 2x a day >> 1000 mg with dinner >> 500 mg 2x a dAay - Jardiance 25 mg before b'fast - Mounjaro 5 >> 7.5 mg weekly - Glipizide XL 5 mg before b'fast and 5 mg (crushed) before dinner >> 5 mg (crushed) before dinner Stopped Invokana 100 mg daily in am b/c of an yeast inf- started 02/2014  She tried regular Metformin when prediabetic >> severe diarrhea. Before Mounjaro, [name], and then Trulicity.  Pt checks her sugars once a day: - am: 110, 124-170, 210 >> 128-144 >> 118-147, 151 >> 108-135, 140 - 2h after b'fast:n/c >> 184  >> n/c  - before lunch:  107, 122 >> 111-128 >> n/c >> 79 >> 101, 117 >> n/c - 2h after lunch:131 >> 163 >> n/c >> 117 >> n/c  - before dinner: 76-117 >> 99-125 >> 92-122 >>  77, 79, 96-111 >> 68-117, 124 - 2h after dinner:  88-172 >> 138 >> 98, 104 >> n/c - bedtime: 106-147, 154 >> 130-155 >> 99-145 >> 103-140 >> 101-140 - nighttime: n/c >> 70-130 >> n/c >> 84-115 >> n/c Lowest sugar was  76 >> 77 >>  68; she has hypoglycemia awareness in the 70s. Highest sugar was 210 >> 145 >> 151 >> 144.  Glucometer:  [employer] Next  Pt's meals are: - Breakfast: protein bar - mid-am snack: apple + PB - Lunch: sandwich or salad - Dinner: meat + green veggie + starch - Snacks: 2 fruit, [name], [name], last BUN/creatinine:  Lab Results  Component Value Date   BUN 15 07/28/2021   CREATININE 0.76 07/28/2021  On losartan.  -+ HL; last set of lipids: Lab Results  Component Value Date   CHOL 195 07/28/2021   HDL 62 07/28/2021   LDLCALC 108 (H) 07/28/2021   LDLDIRECT 154.0 09/26/2018   TRIG 130 07/28/2021   CHOLHDL 3.1 07/28/2021  Previously on Zocor-muscle cramps >> now on Crestor 5 mg 3-5x a week.  - last eye exam was on 2023: No DR reportedly.  -No numbness  and tingling in her feet.  Last foot exam 03/2021.  She quit smoking >> on Chantix.  ROS: + see HPI  I reviewed pt's medications, allergies, [name], social hx, family hx, [name], unchanged from my initial visit note.  Past Medical History:  Diagnosis Date   Allergy    Diabetes mellitus without complication ([place])    Hyperlipidemia    Insomnia    Obesity    Past Surgical History:  Procedure Laterality Date   oral surgeries     TUBAL LIGATION     History   Social History   Marital Status: married    Spouse Name: N/A    Number of Children: 2   Occupational History   Music therapist   Social History Main Topics   Smoking status: Current Every Day Smoker -- 0.5 packs/day    Types: Cigarettes   Smokeless tobacco: Never Used   Alcohol Use: No   Drug Use: No   Current Outpatient  Medications on File Prior to Visit  Medication Sig Dispense Refill   albuterol (VENTOLIN HFA) 108 (90 Base) MCG/ACT inhaler Inhale 2 puffs into the lungs every 6 (six) hours as needed for wheezing or shortness of breath. 8.5 g PRN   b complex vitamins tablet Take 1 tablet by mouth daily.     Blood Glucose Monitoring Suppl (FREESTYLE LITE) DEVI Use to check blood sugar once a day. 1 each 0   cetirizine (ZYRTEC) 10 MG tablet Take 10 mg by mouth daily.       cholecalciferol (VITAMIN D) 1000 UNITS tablet Take 1,000 Units by mouth daily.      empagliflozin (JARDIANCE) 25 MG TABS tablet TAKE 1 TABLET BY MOUTH DAILY BEFORE BREAKFAST. 90 tablet 3   glipiZIDE (GLUCOTROL XL) 5 MG 24 hr tablet Take 1 tablet (5 mg total) by mouth daily before supper. 90 tablet 3   glucose blood (FREESTYLE LITE) test strip Use to check blood sugar daily 100 strip 12   Lancets (FREESTYLE) lancets Use to check blood sugar once a day. 100 each 12   losartan (COZAAR) 50 MG tablet Take 1 tablet (50 mg total) by mouth daily. 90 tablet 3   metFORMIN (GLUCOPHAGE-XR) 500 MG 24 hr tablet Take 1 tablet (500 mg total) by mouth 2 (two) times daily with a meal. 180 tablet 1   nystatin-triamcinolone (MYCOLOG II) cream Apply topically to the corners of the mouth 4 times a day 15 g 1   rosuvastatin (CRESTOR) 5 MG tablet Take 1 tablet (5 mg total) by mouth 3 (three) times a week. 36 tablet 3   tirzepatide (MOUNJARO) 7.5 MG/0.5ML Pen Inject 7.5 mg into the skin once a week. 2 mL 4   No current facility-administered medications on file prior to visit.   Allergies  Allergen Reactions   Metformin And Related Diarrhea   Penicillins Rash   Family History  Problem Relation Age of Onset   Breast cancer Mother 46   Diabetes Mother    Hypertension Mother    Diabetes Father    Hypertension Father    Hypertension Sister    Colon cancer Maternal Grandmother    Esophageal cancer Neg Hx    Stomach cancer Neg Hx    Rectal cancer Neg Hx     PE: BP 126/74 (BP Location: Left Arm, Patient Position: Sitting, Cuff Size: Normal)   Pulse 79   Ht 5' 5.25" (1.657 m)   Wt 239  lb 12.8 oz (108.8 kg)   SpO2 96%   BMI 39.60 kg/m    Wt Readings from Last 3 Encounters:  12/08/21 239 lb 12.8 oz (108.8 kg)  08/04/21 242 lb (109.8 kg)  07/30/21 240 lb 4 oz (109 kg)   Constitutional: overweight, in NAD Eyes: EOMI, no exophthalmos ENT: no thyromegaly, no cervical lymphadenopathy Cardiovascular: RRR, No MRG Respiratory: CTA B Musculoskeletal: no deformities Skin: moist, warm, no rashes Neurological: no tremor with outstretched hands  ASSESSMENT: 1. DM2, non-insulin-dependent, uncontrolled, without long term complications, but with am hyperglycemia  2. Obesity class 2 BMI Classification: < 18.5 underweight  18.5-24.9 normal weight  25.0-29.9 overweight  30.0-34.9 class I obesity  35.0-39.9 class II obesity  ? 40.0 class III obesity   3. HL  PLAN:  1. Patient with longstanding, uncontrolled, type 2 diabetes, on oral antidiabetic regimen with metformin, sulfonylurea, SGLT2 inhibitor and also weekly GLP-1/GIP receptor agonist, with significant improvement in control after adding Mounjaro.  She had some nausea with it in the past, but this resolved.  At last visit, HbA1c was 6.3%, improved.  At that time, sugars were at or slightly above goal in the morning and they were almost all at goal later in the day.  I advised her to stop her morning glipizide and continued with only glipizide before dinner. -At today's visit, her sugars remain controlled, occasionally slightly higher in the morning, but at goal later in the day except for 1 lower blood sugar at 68 before dinner after in the day when she had an early lunch.  She did not feel she had other lows.  We discussed about continuing the same regimen.  She is currently crushing 5 mg glipizide XL tablet before dinner but she is running out of this so we can now send an instant release  glipizide to her pharmacy. - I suggested to:  Patient Instructions  Please continue: - Metformin ER 500 mg 2x a day.  - Jardiance 25 mg before b'fast - Mounjaro 7.5 mg weekly  Please change: - Glipizide 5 mg before dinner  Please return in 4 months with your sugar log.  - we checked her HbA1c: 6.2% (lower) - advised to check sugars at different times of the day - 1x a day, rotating check times - advised for yearly eye exams >> she is UTD - return to clinic in 4 months  2. Obesity class 2 -continue SGLT 2 inhibitor and GLP-1/GIP receptor agonist which should also help with weight loss -She gained 2 pounds before last visit -She lost 3 pounds since last visit  3. HL -Reviewed latest lipid panel from 07/2021: LDL improved but above target, the rest of the lipids at goal: Lab Results  Component Value Date   CHOL 195 07/28/2021   HDL 62 07/28/2021   LDLCALC 108 (H) 07/28/2021   LDLDIRECT 154.0 09/26/2018   TRIG 130 07/28/2021   CHOLHDL 3.1 07/28/2021  -She continues on Crestor 5 mg several times a week.  She had muscle cramps with Zocor before.  She continues on co-Q10 and turmeric.  Philemon Kingdom, MD PhD [facility] Endocrinology

## 2021-12-08 NOTE — Patient Instructions (Addendum)
Please continue: - Metformin ER 500 mg 2x a day.  - Jardiance 25 mg before b'fast - Mounjaro 7.5 mg weekly  Please change: - Glipizide 5 mg before dinner  Please return in 4 months with your sugar log.

## 2021-12-29 ENCOUNTER — Other Ambulatory Visit (HOSPITAL_COMMUNITY): Payer: Self-pay

## 2022-01-13 ENCOUNTER — Other Ambulatory Visit: Payer: Self-pay

## 2022-01-13 ENCOUNTER — Other Ambulatory Visit (HOSPITAL_COMMUNITY): Payer: Self-pay

## 2022-01-29 ENCOUNTER — Other Ambulatory Visit (HOSPITAL_COMMUNITY): Payer: Self-pay

## 2022-01-29 ENCOUNTER — Other Ambulatory Visit: Payer: Self-pay | Admitting: Internal Medicine

## 2022-01-29 ENCOUNTER — Other Ambulatory Visit: Payer: Self-pay

## 2022-01-29 DIAGNOSIS — E1165 Type 2 diabetes mellitus with hyperglycemia: Secondary | ICD-10-CM

## 2022-01-29 MED ORDER — MOUNJARO 7.5 MG/0.5ML ~~LOC~~ SOAJ
7.5000 mg | SUBCUTANEOUS | 4 refills | Status: DC
  Filled 2022-01-29: qty 2, 28d supply, fill #0
  Filled 2022-02-26: qty 2, 28d supply, fill #1
  Filled 2022-03-25: qty 2, 28d supply, fill #2
  Filled 2022-04-19: qty 2, 28d supply, fill #3
  Filled 2022-05-16: qty 2, 28d supply, fill #4

## 2022-02-01 ENCOUNTER — Other Ambulatory Visit (HOSPITAL_COMMUNITY): Payer: Self-pay

## 2022-02-08 ENCOUNTER — Other Ambulatory Visit: Payer: Self-pay

## 2022-03-05 ENCOUNTER — Other Ambulatory Visit (HOSPITAL_COMMUNITY): Payer: Self-pay

## 2022-03-05 MED ORDER — NYSTATIN-TRIAMCINOLONE 100000-0.1 UNIT/GM-% EX CREA
TOPICAL_CREAM | CUTANEOUS | 1 refills | Status: AC
  Filled 2022-03-05: qty 15, 7d supply, fill #0
  Filled 2022-07-07: qty 15, 7d supply, fill #1

## 2022-03-25 ENCOUNTER — Other Ambulatory Visit: Payer: Self-pay | Admitting: Internal Medicine

## 2022-03-25 ENCOUNTER — Other Ambulatory Visit (HOSPITAL_COMMUNITY): Payer: Self-pay

## 2022-03-25 ENCOUNTER — Other Ambulatory Visit: Payer: Self-pay

## 2022-03-25 MED ORDER — METFORMIN HCL ER 500 MG PO TB24
500.0000 mg | ORAL_TABLET | Freq: Two times a day (BID) | ORAL | 1 refills | Status: DC
  Filled 2022-03-25: qty 180, 90d supply, fill #0
  Filled 2022-06-25: qty 180, 90d supply, fill #1

## 2022-04-19 ENCOUNTER — Other Ambulatory Visit (HOSPITAL_COMMUNITY): Payer: Self-pay

## 2022-04-20 ENCOUNTER — Encounter: Payer: Self-pay | Admitting: Internal Medicine

## 2022-04-20 ENCOUNTER — Ambulatory Visit: Payer: Commercial Managed Care - PPO | Admitting: Internal Medicine

## 2022-04-20 VITALS — BP 128/72 | HR 89 | Ht 65.25 in | Wt 239.0 lb

## 2022-04-20 DIAGNOSIS — E1165 Type 2 diabetes mellitus with hyperglycemia: Secondary | ICD-10-CM

## 2022-04-20 DIAGNOSIS — E7849 Other hyperlipidemia: Secondary | ICD-10-CM

## 2022-04-20 DIAGNOSIS — Z6838 Body mass index (BMI) 38.0-38.9, adult: Secondary | ICD-10-CM

## 2022-04-20 LAB — POCT GLYCOSYLATED HEMOGLOBIN (HGB A1C): Hemoglobin A1C: 6.3 % — AB (ref 4.0–5.6)

## 2022-04-20 NOTE — Progress Notes (Signed)
Patient ID: Ashley Gilmore, female   DOB: [birth year], 57 y.o.   MRN: [id number] HPI: Ashley Gilmore is a 57 y.o.-year-old female, returning for f/u for DM2, dx in GDM in 1995 and 1997, then DM2 in ~2010, non-insulin-dependent, uncontrolled, without long-term complications.  Her husband is also my patient.  Last visit 4 months ago.  Interim history: No increased urination, blurry vision, chest pain.   She has joint aches, leg swelling - wears compression socks.   Reviewed HbA1c levels: Lab Results  Component Value Date   HGBA1C 6.2 (A) 12/08/2021   HGBA1C 6.3 (H) 07/28/2021   HGBA1C 7.0 (A) 04/02/2021   HGBA1C 7.7 (A) 12/23/2020   HGBA1C 7.2 (H) 07/24/2020   HGBA1C 7.2 (A) 07/14/2020   HGBA1C 6.7 (A) 12/25/2019   HGBA1C 7.8 (A) 06/05/2019   HGBA1C 7.8 (A) 09/26/2018   HGBA1C 7.4 (A) 02/28/2018   HGBA1C 7.4 (H) 10/31/2017   HGBA1C 7.5 (A) 09/27/2017   HGBA1C 7.0 (H) 04/11/2017   HGBA1C 6.3 11/25/2016   HGBA1C 6.4 (H) 08/09/2016   HGBA1C 7.7 04/20/2016   HGBA1C 6.9 12/05/2015   HGBA1C 6.8 07/29/2015   HGBA1C 9.2 04/29/2015   HGBA1C 8.0 (H) 12/19/2014  04/30/2014: HbA1c 7.5%.  Pt is on a regimen of: - Metformin XR 500 mg 2x a day >> 1...>> 500 mg 2x a day >> 1000 mg with dinner >> 500 mg 2x a day - Jardiance 25 mg before b'fast - Mounjaro 5 >> 7.5 mg weekly - Glipizide and 5 mg (crushed) before dinner >> now only on  instant release glipizide 5 mg before dinner Stopped Invokana 100 mg daily in am b/c of an yeast inf- started 02/2014  She tried regular Metformin when prediabetic >> severe diarrhea. Before Mounjaro, [name], and then Trulicity.  Pt checks her sugars once a day: - am: 128-144 >> 118-147, 151 >> 108-135, 140 >> 119-142 - 2h after b'fast:n/c >> 184  >> n/c  - before lunch:111-128 >> n/c >> 79 >> 101, 117 >> n/c - 2h after lunch:131 >> 163 >> n/c >> 117 >> n/c  - before dinner: 77, 79, 96-111 >> 68-117, 124 >> 82-132 - 2h after dinner:  88-172 >> 138 >> 98, 104  >> n/c - bedtime: 99-145 >> 103-140 >> 101-140 >> 116-140 - nighttime: n/c >> 70-130 >> n/c >> 84-115 >> n/c Lowest sugar was  76 >> 77 >>  68 >> 82; she has hypoglycemia awareness in the 70s. Highest sugar was 210 >> 145 >> 151 >> 144 >> 142.  Glucometer:  [employer] Next  Pt's meals are: - Breakfast: protein bar - mid-am snack: apple + PB - Lunch: sandwich or salad - Dinner: meat + green veggie + starch - Snacks: 2 fruit, [name], [name], last BUN/creatinine:  Lab Results  Component Value Date   BUN 15 07/28/2021   CREATININE 0.76 07/28/2021  On losartan.  -+ HL; last set of lipids: Lab Results  Component Value Date   CHOL 195 07/28/2021   HDL 62 07/28/2021   LDLCALC 108 (H) 07/28/2021   LDLDIRECT 154.0 09/26/2018   TRIG 130 07/28/2021   CHOLHDL 3.1 07/28/2021  Previously on Zocor-muscle cramps >> now on Crestor 5 mg 3-5x a week.  - last eye exam was on 06/26/2021: No DR reportedly.  - No numbness and tingling in her feet.  Last foot exam 03/2021.  She quit smoking >> on  Chantix.  ROS: + see HPI  I reviewed pt's medications, allergies, [name], social hx, family hx, [name], unchanged from my initial visit note.  Past Medical History:  Diagnosis Date   Allergy    Diabetes mellitus without complication ([place])    Hyperlipidemia    Insomnia    Obesity    Past Surgical History:  Procedure Laterality Date   oral surgeries     TUBAL LIGATION     History   Social History   Marital Status: married    Spouse Name: N/A    Number of Children: 2   Occupational History   Music therapist   Social History Main Topics   Smoking status: Current Every Day Smoker -- 0.5 packs/day    Types: Cigarettes   Smokeless tobacco: Never Used   Alcohol Use: No   Drug Use: No   Current Outpatient Medications on File Prior to Visit  Medication Sig Dispense Refill    albuterol (VENTOLIN HFA) 108 (90 Base) MCG/ACT inhaler Inhale 2 puffs into the lungs every 6 (six) hours as needed for wheezing or shortness of breath. 8.5 g PRN   b complex vitamins tablet Take 1 tablet by mouth daily.     Blood Glucose Monitoring Suppl (FREESTYLE LITE) DEVI Use to check blood sugar once a day. 1 each 0   cetirizine (ZYRTEC) 10 MG tablet Take 10 mg by mouth daily.       cholecalciferol (VITAMIN D) 1000 UNITS tablet Take 1,000 Units by mouth daily.      empagliflozin (JARDIANCE) 25 MG TABS tablet TAKE 1 TABLET BY MOUTH DAILY BEFORE BREAKFAST. 90 tablet 3   glipiZIDE (GLUCOTROL) 5 MG tablet Take 1 tablet (5 mg total) by mouth daily before supper. 90 tablet 3   glucose blood (FREESTYLE LITE) test strip Use to check blood sugar daily 100 strip 12   Lancets (FREESTYLE) lancets Use to check blood sugar once a day. 100 each 12   losartan (COZAAR) 50 MG tablet Take 1 tablet (50 mg total) by mouth daily. 90 tablet 3   metFORMIN (GLUCOPHAGE-XR) 500 MG 24 hr tablet Take 1 tablet (500 mg total) by mouth 2 (two) times daily with a meal. 180 tablet 1   nystatin-triamcinolone (MYCOLOG II) cream Apply to corners of mouth 4 times a day 15 g 1   rosuvastatin (CRESTOR) 5 MG tablet Take 1 tablet (5 mg total) by mouth 3 (three) times a week. 36 tablet 3   tirzepatide (MOUNJARO) 7.5 MG/0.5ML Pen Inject 7.5 mg into the skin once a week. 2 mL 4   No current facility-administered medications on file prior to visit.   Allergies  Allergen Reactions   Metformin And Related Diarrhea   Penicillins Rash   Family History  Problem Relation Age of Onset   Breast cancer Mother 56   Diabetes Mother    Hypertension Mother    Diabetes Father    Hypertension Father    Hypertension Sister    Colon cancer Maternal Grandmother    Esophageal cancer Neg Hx    Stomach cancer Neg Hx    Rectal cancer Neg Hx    PE: BP 128/72 (BP Location: Right Arm, Patient Position: Sitting, Cuff Size: Normal)   Pulse 89    Ht 5' 5.25" (1.657 m)   Wt 239 lb (108.4 kg)   SpO2 97%   BMI 39.47 kg/m    Wt Readings from Last 3 Encounters:  04/20/22 239 lb (108.4 kg)  12/08/21 239 lb 12.8 oz (108.8 kg)  08/04/21 242 lb (109.8 kg)   Constitutional: overweight, in NAD Eyes: EOMI, no exophthalmos ENT: no thyromegaly, no cervical lymphadenopathy Cardiovascular: RRR, No MRG Respiratory: CTA B Musculoskeletal: no deformities Skin: no rashes Neurological: no tremor with outstretched hands Diabetic Foot Exam - Simple   Simple Foot Form Diabetic Foot exam was performed with the following findings: Yes 04/20/2022  4:24 PM  Visual Inspection No deformities, no ulcerations, no other skin breakdown bilaterally: Yes Sensation Testing Intact to touch and monofilament testing bilaterally: Yes Pulse Check Posterior Tibialis and Dorsalis pulse intact bilaterally: Yes Comments B foot pitting edema, stasis dermatitis    ASSESSMENT: 1. DM2, non-insulin-dependent, uncontrolled, without long term complications, but with am hyperglycemia  2. Obesity class 2  3. HL  PLAN:  1. Patient with longstanding, uncontrolled, type 2 diabetes, on oral antidiabetic regimen with metformin, sulfonylurea, SGLT2 inhibitor and also weekly GLP-1/GIP receptor agonist, with significant improvement in control after adding Mounjaro.  At last visit, HbA1c was controlled, lower, at 6.2%.  We did not change her regimen but I did recommend to start crushing the 5 mg glipizide XL tablet and I sent a prescription for the extended release glipizide to her pharmacy to use instead. -At today's visit, sugars are at goal or slightly above in the morning and they are well-controlled later in the day.  She takes glipizide before dinner almost every day, with occasional missed doses due to forgetting to take the dose or eating a very light meal.  I advised her to continue to take it only with larger meals.  Otherwise, we can continue the rest of the regimen.  She  tolerates it well. - I suggested to:  Patient Instructions  Please use the following regimen: - Metformin ER 500 mg 2x a day  - Glipizide 5 mg before a larger/later dinner - Jardiance 25 mg before b'fast - Mounjaro 7.5 mg weekly  Please return in 4 months with your sugar log.  - we checked her HbA1c: 6.3% (slightly higher) - advised to check sugars at different times of the day - 1x a day, rotating check times - advised for yearly eye exams >> she is UTD - return to clinic in 4 months  2. Obesity class 2 -continue SGLT 2 inhibitor and GLP-1/GIP receptor agonist which should also help with weight loss -She lost 3 pounds before last visit, previously gained 2 -At today's visit, weight is stable  3. HL -Reviewed latest lipid panel from 07/2021: LDL improved, but above target, the rest of lipids at goal: Lab Results  Component Value Date   CHOL 195 07/28/2021   HDL 62 07/28/2021   LDLCALC 108 (H) 07/28/2021   LDLDIRECT 154.0 09/26/2018   TRIG 130 07/28/2021   CHOLHDL 3.1 07/28/2021  -She continues on Crestor 5 mg several times a week.  She had muscle cramps with Zocor before.  She continues on co-Q10 and turmeric.  Philemon Kingdom, MD PhD [facility] Endocrinology

## 2022-04-20 NOTE — Patient Instructions (Addendum)
Please use the following regimen: - Metformin ER 500 mg 2x a day  - Glipizide 5 mg before a larger/later dinner - Jardiance 25 mg before b'fast - Mounjaro 7.5 mg weekly  Please return in 4 months with your sugar log.

## 2022-05-04 ENCOUNTER — Other Ambulatory Visit (HOSPITAL_COMMUNITY): Payer: Self-pay

## 2022-06-18 ENCOUNTER — Other Ambulatory Visit: Payer: Self-pay | Admitting: Internal Medicine

## 2022-06-18 ENCOUNTER — Other Ambulatory Visit (HOSPITAL_COMMUNITY): Payer: Self-pay

## 2022-06-18 DIAGNOSIS — E1165 Type 2 diabetes mellitus with hyperglycemia: Secondary | ICD-10-CM

## 2022-06-18 MED ORDER — MOUNJARO 7.5 MG/0.5ML ~~LOC~~ SOAJ
7.5000 mg | SUBCUTANEOUS | 2 refills | Status: DC
  Filled 2022-06-18: qty 2, 28d supply, fill #0
  Filled 2022-07-12: qty 2, 28d supply, fill #1
  Filled 2022-08-09: qty 2, 28d supply, fill #2

## 2022-07-12 ENCOUNTER — Other Ambulatory Visit (HOSPITAL_COMMUNITY): Payer: Self-pay

## 2022-07-28 ENCOUNTER — Other Ambulatory Visit (HOSPITAL_COMMUNITY): Payer: Self-pay

## 2022-07-28 ENCOUNTER — Other Ambulatory Visit: Payer: Self-pay | Admitting: Internal Medicine

## 2022-07-28 MED ORDER — EMPAGLIFLOZIN 25 MG PO TABS
ORAL_TABLET | Freq: Every day | ORAL | 3 refills | Status: DC
  Filled 2022-07-28: qty 90, 90d supply, fill #0
  Filled 2022-11-01: qty 90, 90d supply, fill #1
  Filled 2023-01-29: qty 90, 90d supply, fill #2
  Filled 2023-04-26: qty 90, 90d supply, fill #3

## 2022-07-29 NOTE — Progress Notes (Signed)
Patient Care Team: Margaree Mackintosh, MD as PCP - General (Internal Medicine) Carlus Pavlov, MD as Consulting Physician (Internal Medicine)  Visit Date: 08/05/22  Subjective:    Patient ID: Ashley Gilmore , Female   DOB: 21-Sep-1965, 57 y.o.    MRN: [id number]   57 y.o. Female presents today for annual comprehensive physical exam.  History of Type 2 diabetes mellitus treated with metformin 500 mg twice daily with a meal, Jardiance 25 mg daily before breakfast, glipizide 5 mg daily before supper, Mounjaro 7.5 mg weekly. HGBA1c elevated at 6.6% on 08/03/22, up from 6.3% on 04/20/22. Urinalysis positive for glucose. Followed by Dr. Elvera Lennox, endocrinologist.   History of hyperlipidemia treated with rosuvastatin 5 mg daily three times weekly. CHOL elevated at 200. TRIG elevated at 151. LDL elevated at 111.   Glucose elevated at 106. Potassium elevated at 5.6. CO2 elevated at 33. Calcium elevated at 10.9. RBC elevated at 5.66. Hemoglobin elevated at 16.1. HCT elevated at 48.1. TSH at 2.11.   Pap smear last completed 02/12/16. Negative for intraepithelial lesion or malignancy.   09/03/21 mammogram showed indeterminate 4 mm asymmetry with calcifications. Pathology showed benign adenosis with calcifications.  Colonoscopy last completed 10/25/19. Showed one 4 mm polyp in mid transverse colon, one 10 mm polyp in distal transverse colon. Examination otherwise normal. 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 Recommended repeat in 2024.  Past Medical History:  Diagnosis Date   Allergy    Diabetes mellitus without complication (HCC)    Hyperlipidemia    Insomnia    Obesity      Family History  Problem Relation Age of Onset   Breast cancer Mother 69   Diabetes Mother    Hypertension Mother    Diabetes Father    Hypertension Father    Hypertension Sister    Colon cancer Maternal Grandmother    Esophageal cancer Neg Hx    Stomach cancer Neg Hx     Rectal cancer Neg Hx     [name]: married, 2 adult daughters. 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     Review of Systems  Constitutional:  Negative for chills, fever, malaise/fatigue and weight loss.  HENT:  Negative for hearing loss, sinus pain and sore throat.   Respiratory:  Negative for cough, hemoptysis and shortness of breath.   Cardiovascular:  Negative for chest pain, palpitations, leg swelling and PND.  Gastrointestinal:  Negative for abdominal pain, constipation, diarrhea, heartburn, nausea and vomiting.  Genitourinary:  Negative for dysuria, frequency and urgency.  Musculoskeletal:  Negative for back pain, myalgias and neck pain.  Skin:  Negative for itching and rash.  Neurological:  Negative for dizziness, tingling, seizures and headaches.  Endo/Heme/Allergies:  Negative for polydipsia.  Psychiatric/Behavioral:  Negative for depression. The patient is not nervous/anxious.         Objective:   Vitals: BP 136/78   Pulse (!) 102   Resp 16   Ht 5' 5.5" (1.664 m)   Wt 234 lb 12 oz (106.5 kg)   SpO2 97%   BMI 38.47 kg/m    Physical Exam Vitals and nursing note reviewed.  Constitutional:      General: She is not in acute distress.    Appearance: Normal appearance. She is not ill-appearing or toxic-appearing.  HENT:     Head: Normocephalic and atraumatic.     Right Ear: Hearing, tympanic membrane, ear canal and external ear normal.  Left Ear: Hearing, tympanic membrane, ear canal and external ear normal.     Mouth/Throat:     Pharynx: Oropharynx is clear.  Eyes:     Extraocular Movements: Extraocular movements intact.     Pupils: Pupils are equal, round, and reactive to light.  Neck:     Thyroid: No thyroid mass, thyromegaly or thyroid tenderness.     Vascular: No carotid bruit.  Cardiovascular:     Rate and Rhythm: Normal rate and regular rhythm. No extrasystoles are present.    Pulses:          Dorsalis pedis pulses  are 1+ on the right side and 1+ on the left side.     Heart sounds: Normal heart sounds. No murmur heard.    No friction rub. No gallop.  Pulmonary:     Effort: Pulmonary effort is normal.     Breath sounds: Normal breath sounds. No decreased breath sounds, wheezing, rhonchi or rales.  Chest:     Chest wall: No mass.  Abdominal:     Palpations: Abdomen is soft. There is no hepatomegaly, splenomegaly or mass.     Tenderness: There is no abdominal tenderness.     Hernia: No hernia is present.  Genitourinary:    Comments: NFEG Pap taken. Bimanual exam normal- rectovaginal confirms. Musculoskeletal:     Cervical back: Normal range of motion.     Right lower leg: No edema.     Left lower leg: No edema.  Lymphadenopathy:     Cervical: No cervical adenopathy.     Upper Body:     Right upper body: No supraclavicular adenopathy.     Left upper body: No supraclavicular adenopathy.  Skin:    General: Skin is warm and dry.  Neurological:     General: No focal deficit present.     Mental Status: She is alert and oriented to person, place, and time. Mental status is at baseline.     Sensory: Sensation is intact.     Motor: Motor function is intact. No weakness.     Deep Tendon Reflexes: Reflexes are normal and symmetric.  Psychiatric:        Attention and Perception: Attention normal.        Mood and Affect: Mood normal.        Speech: Speech normal.        Behavior: Behavior normal.        Thought Content: Thought content normal.        Cognition and Memory: Cognition normal.        Judgment: Judgment normal.       Results:   Studies obtained and personally reviewed by me:  Pap smear last completed 02/12/16. Negative for intraepithelial lesion or malignancy.   09/03/21 mammogram showed indeterminate 4 mm asymmetry with calcifications. Pathology showed benign adenosis with calcifications.  Colonoscopy last completed 10/25/19. Showed one 4 mm polyp in mid transverse colon, one 10  mm polyp in distal transverse colon. Examination otherwise normal. 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 Recommended repeat in 2024.  Labs:       Component Value Date/Time   NA 140 08/03/2022 1004   K 5.6 (H) 08/03/2022 1004   CL 101 08/03/2022 1004   CO2 33 (H) 08/03/2022 1004   GLUCOSE 106 (H) 08/03/2022 1004   BUN 17 08/03/2022 1004   CREATININE 0.70 08/03/2022 1004   CALCIUM 10.9 (H) 08/03/2022 1004   PROT 6.9 08/03/2022 1004  ALBUMIN 3.9 08/09/2016 0909   AST 19 08/03/2022 1004   ALT 24 08/03/2022 1004   ALKPHOS 110 08/09/2016 0909   BILITOT 0.5 08/03/2022 1004   GFRNONAA 82 07/24/2020 0916   GFRAA 96 07/24/2020 0916     Lab Results  Component Value Date   WBC 6.0 08/03/2022   HGB 16.1 (H) 08/03/2022   HCT 48.1 (H) 08/03/2022   MCV 85.0 08/03/2022   PLT 282 08/03/2022    Lab Results  Component Value Date   CHOL 200 (H) 08/03/2022   HDL 63 08/03/2022   LDLCALC 111 (H) 08/03/2022   LDLDIRECT 154.0 09/26/2018   TRIG 151 (H) 08/03/2022   CHOLHDL 3.2 08/03/2022    Lab Results  Component Value Date   HGBA1C 6.6 (H) 08/03/2022     Lab Results  Component Value Date   TSH 2.11 08/03/2022      Assessment & Plan:   Type 2 diabetes mellitus: treated with metformin 500 mg twice daily with a meal, Jardiance 25 mg daily before breakfast, glipizide 5 mg daily before supper, Mounjaro 7.5 mg weekly. HGBA1c elevated at 6.6% on 08/03/22, up from 6.3% on 04/20/22. Urinalysis positive for glucose. Followed by Dr. Elvera Lennox, endocrinologist.   Hyperlipidemia: treated with rosuvastatin 5 mg daily three times weekly. CHOL elevated at 200. TRIG elevated at 151. LDL elevated at 111.    Reactive airways-Refilled albuterol inhaler as needed for wheezing, shortness of breath.  Potassium, calcium were slightly elevated. This is likely a lab error. Ordered repeat serum potassium, calcium tests.  Pap smear last completed 02/12/16.  Negative for intraepithelial lesion or malignancy. Pap taken today.  09/03/21 mammogram showed indeterminate 4 mm asymmetry with calcifications. Pathology showed benign adenosis with calcifications.  Colonoscopy last completed 10/25/19. Showed one 4 mm polyp in mid transverse colon, one 10 mm polyp in distal transverse colon. Examination otherwise normal. 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 Recommended repeat in 2024.  Vaccine counseling: she will check with employee health regarding tetanus vaccine and contact us.  Return in 1 year for health maintenance exam or as needed.    I,Alexander Ruley,acting as a Neurosurgeon for Margaree Mackintosh, MD.,have documented all relevant documentation on the behalf of Margaree Mackintosh, MD,as directed by  Margaree Mackintosh, MD while in the presence of Margaree Mackintosh, MD.   I, Margaree Mackintosh, MD, have reviewed all documentation for this visit. The documentation on 08/18/22 for the exam, diagnosis, procedures, and orders are all accurate and complete.

## 2022-08-02 ENCOUNTER — Other Ambulatory Visit (HOSPITAL_COMMUNITY): Payer: Self-pay

## 2022-08-02 ENCOUNTER — Other Ambulatory Visit: Payer: Self-pay | Admitting: Internal Medicine

## 2022-08-02 MED ORDER — LOSARTAN POTASSIUM 50 MG PO TABS
50.0000 mg | ORAL_TABLET | Freq: Every day | ORAL | 3 refills | Status: DC
  Filled 2022-08-02: qty 90, 90d supply, fill #0
  Filled 2022-11-01: qty 90, 90d supply, fill #1
  Filled 2023-01-29: qty 90, 90d supply, fill #2
  Filled 2023-04-26: qty 90, 90d supply, fill #3

## 2022-08-03 ENCOUNTER — Other Ambulatory Visit: Payer: Commercial Managed Care - PPO

## 2022-08-03 DIAGNOSIS — E1159 Type 2 diabetes mellitus with other circulatory complications: Secondary | ICD-10-CM | POA: Diagnosis not present

## 2022-08-03 DIAGNOSIS — Z1329 Encounter for screening for other suspected endocrine disorder: Secondary | ICD-10-CM

## 2022-08-03 DIAGNOSIS — E1165 Type 2 diabetes mellitus with hyperglycemia: Secondary | ICD-10-CM

## 2022-08-03 DIAGNOSIS — I152 Hypertension secondary to endocrine disorders: Secondary | ICD-10-CM | POA: Diagnosis not present

## 2022-08-03 DIAGNOSIS — E7849 Other hyperlipidemia: Secondary | ICD-10-CM

## 2022-08-04 LAB — LIPID PANEL
Cholesterol: 200 mg/dL — ABNORMAL HIGH (ref <200)
HDL: 63 mg/dL (ref >=50)
LDL Cholesterol (Calc): 111 mg/dL (calc) — ABNORMAL HIGH
Non-HDL Cholesterol (Calc): 137 mg/dL (calc) — ABNORMAL HIGH (ref <130)
Total CHOL/HDL Ratio: 3.2 (calc) (ref <5.0)
Triglycerides: 151 mg/dL — ABNORMAL HIGH (ref <150)

## 2022-08-04 LAB — CBC WITH DIFFERENTIAL/PLATELET
Absolute Monocytes: 432 cells/uL (ref 200–950)
Basophils Absolute: 72 cells/uL (ref 0–200)
Basophils Relative: 1.2 %
Eosinophils Absolute: 180 cells/uL (ref 15–500)
Eosinophils Relative: 3 %
HCT: 48.1 % — ABNORMAL HIGH (ref 35.0–45.0)
Hemoglobin: 16.1 g/dL — ABNORMAL HIGH (ref 11.7–15.5)
Lymphs Abs: 1770 cells/uL (ref 850–3900)
MCH: 28.4 pg (ref 27.0–33.0)
MCHC: 33.5 g/dL (ref 32.0–36.0)
MCV: 85 fL (ref 80.0–100.0)
MPV: 9.7 fL (ref 7.5–12.5)
Monocytes Relative: 7.2 %
Neutro Abs: 3546 cells/uL (ref 1500–7800)
Neutrophils Relative %: 59.1 %
Platelets: 282 10*3/uL (ref 140–400)
RBC: 5.66 10*6/uL — ABNORMAL HIGH (ref 3.80–5.10)
RDW: 13.5 % (ref 11.0–15.0)
Total Lymphocyte: 29.5 %
WBC: 6 10*3/uL (ref 3.8–10.8)

## 2022-08-04 LAB — COMPLETE METABOLIC PANEL WITH GFR
AG Ratio: 1.7 (calc) (ref 1.0–2.5)
ALT: 24 U/L (ref 6–29)
AST: 19 U/L (ref 10–35)
Albumin: 4.3 g/dL (ref 3.6–5.1)
Alkaline phosphatase (APISO): 83 U/L (ref 37–153)
BUN: 17 mg/dL (ref 7–25)
CO2: 33 mmol/L — ABNORMAL HIGH (ref 20–32)
Calcium: 10.9 mg/dL — ABNORMAL HIGH (ref 8.6–10.4)
Chloride: 101 mmol/L (ref 98–110)
Creat: 0.7 mg/dL (ref 0.50–1.03)
Globulin: 2.6 g/dL (calc) (ref 1.9–3.7)
Glucose, Bld: 106 mg/dL — ABNORMAL HIGH (ref 65–99)
Potassium: 5.6 mmol/L — ABNORMAL HIGH (ref 3.5–5.3)
Sodium: 140 mmol/L (ref 135–146)
Total Bilirubin: 0.5 mg/dL (ref 0.2–1.2)
Total Protein: 6.9 g/dL (ref 6.1–8.1)
eGFR: 100 mL/min/{1.73_m2} (ref >=60)

## 2022-08-04 LAB — HEMOGLOBIN A1C
Hgb A1c MFr Bld: 6.6 % of total Hgb — ABNORMAL HIGH (ref <5.7)
Mean Plasma Glucose: 143 mg/dL
eAG (mmol/L): 7.9 mmol/L

## 2022-08-04 LAB — MICROALBUMIN / CREATININE URINE RATIO
Creatinine, Urine: 36 mg/dL (ref 20–275)
Microalb, Ur: <0.2 mg/dL

## 2022-08-04 LAB — TSH: TSH: 2.11 mIU/L (ref 0.40–4.50)

## 2022-08-05 ENCOUNTER — Ambulatory Visit (INDEPENDENT_AMBULATORY_CARE_PROVIDER_SITE_OTHER): Payer: Commercial Managed Care - PPO | Admitting: Internal Medicine

## 2022-08-05 ENCOUNTER — Other Ambulatory Visit (HOSPITAL_COMMUNITY)
Admission: RE | Admit: 2022-08-05 | Discharge: 2022-08-05 | Disposition: A | Discharge status: Home / Self Care | Payer: Commercial Managed Care - PPO | Source: Ambulatory Visit | Attending: Internal Medicine | Admitting: Internal Medicine

## 2022-08-05 ENCOUNTER — Encounter: Payer: Self-pay | Admitting: Internal Medicine

## 2022-08-05 ENCOUNTER — Other Ambulatory Visit (HOSPITAL_COMMUNITY): Payer: Self-pay

## 2022-08-05 VITALS — BP 136/78 | HR 102 | Resp 16 | Ht 65.5 in | Wt 234.8 lb

## 2022-08-05 DIAGNOSIS — Z0000 Encounter for general adult medical examination without abnormal findings: Secondary | ICD-10-CM

## 2022-08-05 DIAGNOSIS — E875 Hyperkalemia: Secondary | ICD-10-CM

## 2022-08-05 DIAGNOSIS — I152 Hypertension secondary to endocrine disorders: Secondary | ICD-10-CM | POA: Diagnosis not present

## 2022-08-05 DIAGNOSIS — Z124 Encounter for screening for malignant neoplasm of cervix: Secondary | ICD-10-CM | POA: Diagnosis not present

## 2022-08-05 DIAGNOSIS — E119 Type 2 diabetes mellitus without complications: Secondary | ICD-10-CM | POA: Diagnosis not present

## 2022-08-05 DIAGNOSIS — E1159 Type 2 diabetes mellitus with other circulatory complications: Secondary | ICD-10-CM | POA: Diagnosis not present

## 2022-08-05 DIAGNOSIS — E7800 Pure hypercholesterolemia, unspecified: Secondary | ICD-10-CM

## 2022-08-05 LAB — POCT URINALYSIS DIPSTICK
Bilirubin, UA: NEGATIVE
Blood, UA: NEGATIVE
Glucose, UA: POSITIVE — AB
Ketones, UA: NEGATIVE
Leukocytes, UA: NEGATIVE
Nitrite, UA: NEGATIVE
Protein, UA: NEGATIVE
Spec Grav, UA: 1.015 (ref 1.010–1.025)
Urobilinogen, UA: 0.2 E.U./dL
pH, UA: 5 (ref 5.0–8.0)

## 2022-08-05 MED ORDER — ALBUTEROL SULFATE HFA 108 (90 BASE) MCG/ACT IN AERS
2.0000 | INHALATION_SPRAY | Freq: Four times a day (QID) | RESPIRATORY_TRACT | 3 refills | Status: DC | PRN
Start: 2022-08-05 — End: 2023-10-03
  Filled 2022-08-05: qty 6.7, 25d supply, fill #0

## 2022-08-05 NOTE — Patient Instructions (Addendum)
Repeat Potassium and Calcium today.Suspect lab abnormalities are due to lab error. Patient will receive Tdap vaccine through Employee Health at [facility]. Continue close follow up with Endocrinology. It was a pleasure to see you today. Return in one year or as needed. Has upcoming colonoscopy with Dr. Myrtie Neither this Fall.

## 2022-08-06 LAB — COMPLETE METABOLIC PANEL WITH GFR
AG Ratio: 1.6 (calc) (ref 1.0–2.5)
ALT: 25 U/L (ref 6–29)
AST: 17 U/L (ref 10–35)
Albumin: 4.5 g/dL (ref 3.6–5.1)
Alkaline phosphatase (APISO): 100 U/L (ref 37–153)
BUN: 15 mg/dL (ref 7–25)
CO2: 33 mmol/L — ABNORMAL HIGH (ref 20–32)
Calcium: 9.8 mg/dL (ref 8.6–10.4)
Chloride: 99 mmol/L (ref 98–110)
Creat: 0.74 mg/dL (ref 0.50–1.03)
Globulin: 2.8 g/dL (calc) (ref 1.9–3.7)
Glucose, Bld: 241 mg/dL — ABNORMAL HIGH (ref 65–99)
Potassium: 4.2 mmol/L (ref 3.5–5.3)
Sodium: 141 mmol/L (ref 135–146)
Total Bilirubin: 0.5 mg/dL (ref 0.2–1.2)
Total Protein: 7.3 g/dL (ref 6.1–8.1)
eGFR: 94 mL/min/{1.73_m2} (ref >=60)

## 2022-08-09 LAB — CYTOLOGY - PAP: Diagnosis: NEGATIVE

## 2022-08-10 ENCOUNTER — Other Ambulatory Visit: Payer: Self-pay | Admitting: Internal Medicine

## 2022-08-10 DIAGNOSIS — Z0000 Encounter for general adult medical examination without abnormal findings: Secondary | ICD-10-CM

## 2022-08-30 ENCOUNTER — Ambulatory Visit
Admission: RE | Admit: 2022-08-30 | Discharge: 2022-08-30 | Disposition: A | Discharge status: Home / Self Care | Payer: Commercial Managed Care - PPO | Source: Ambulatory Visit | Attending: Internal Medicine | Admitting: Internal Medicine

## 2022-08-30 DIAGNOSIS — Z0000 Encounter for general adult medical examination without abnormal findings: Secondary | ICD-10-CM

## 2022-08-30 DIAGNOSIS — Z1231 Encounter for screening mammogram for malignant neoplasm of breast: Secondary | ICD-10-CM | POA: Diagnosis not present

## 2022-09-02 ENCOUNTER — Other Ambulatory Visit: Payer: Self-pay | Admitting: Internal Medicine

## 2022-09-02 DIAGNOSIS — R928 Other abnormal and inconclusive findings on diagnostic imaging of breast: Secondary | ICD-10-CM

## 2022-09-06 ENCOUNTER — Other Ambulatory Visit: Payer: Self-pay | Admitting: Internal Medicine

## 2022-09-06 ENCOUNTER — Other Ambulatory Visit (HOSPITAL_COMMUNITY): Payer: Self-pay

## 2022-09-06 DIAGNOSIS — E1165 Type 2 diabetes mellitus with hyperglycemia: Secondary | ICD-10-CM

## 2022-09-06 MED ORDER — MOUNJARO 7.5 MG/0.5ML ~~LOC~~ SOAJ
7.5000 mg | SUBCUTANEOUS | 2 refills | Status: DC
  Filled 2022-09-06: qty 2, 28d supply, fill #0
  Filled 2022-09-28: qty 2, 28d supply, fill #1
  Filled 2022-10-27: qty 2, 28d supply, fill #2

## 2022-09-07 ENCOUNTER — Ambulatory Visit: Payer: Commercial Managed Care - PPO | Admitting: Internal Medicine

## 2022-09-07 ENCOUNTER — Other Ambulatory Visit (HOSPITAL_COMMUNITY): Payer: Self-pay

## 2022-09-14 ENCOUNTER — Ambulatory Visit
Admission: RE | Admit: 2022-09-14 | Discharge: 2022-09-14 | Disposition: A | Discharge status: Home / Self Care | Payer: Commercial Managed Care - PPO | Source: Ambulatory Visit | Attending: Internal Medicine | Admitting: Internal Medicine

## 2022-09-14 ENCOUNTER — Other Ambulatory Visit: Payer: Commercial Managed Care - PPO

## 2022-09-14 DIAGNOSIS — R928 Other abnormal and inconclusive findings on diagnostic imaging of breast: Secondary | ICD-10-CM

## 2022-09-14 DIAGNOSIS — E119 Type 2 diabetes mellitus without complications: Secondary | ICD-10-CM | POA: Diagnosis not present

## 2022-09-14 DIAGNOSIS — N6001 Solitary cyst of right breast: Secondary | ICD-10-CM | POA: Diagnosis not present

## 2022-09-14 DIAGNOSIS — L723 Sebaceous cyst: Secondary | ICD-10-CM | POA: Diagnosis not present

## 2022-09-14 DIAGNOSIS — N6310 Unspecified lump in the right breast, unspecified quadrant: Secondary | ICD-10-CM | POA: Diagnosis not present

## 2022-09-14 LAB — HM DIABETES EYE EXAM

## 2022-09-21 ENCOUNTER — Other Ambulatory Visit (HOSPITAL_COMMUNITY): Payer: Self-pay

## 2022-09-21 ENCOUNTER — Other Ambulatory Visit: Payer: Self-pay | Admitting: Internal Medicine

## 2022-09-21 MED ORDER — METFORMIN HCL ER 500 MG PO TB24
500.0000 mg | ORAL_TABLET | Freq: Two times a day (BID) | ORAL | 1 refills | Status: DC
  Filled 2022-09-21: qty 180, 90d supply, fill #0
  Filled 2022-12-15: qty 180, 90d supply, fill #1

## 2022-10-04 ENCOUNTER — Other Ambulatory Visit (HOSPITAL_COMMUNITY): Payer: Self-pay

## 2022-11-11 ENCOUNTER — Encounter: Payer: Self-pay | Admitting: Internal Medicine

## 2022-11-11 ENCOUNTER — Ambulatory Visit: Payer: Commercial Managed Care - PPO | Admitting: Internal Medicine

## 2022-11-11 VITALS — BP 124/70 | HR 97 | Ht 65.5 in | Wt 233.4 lb

## 2022-11-11 DIAGNOSIS — E1165 Type 2 diabetes mellitus with hyperglycemia: Secondary | ICD-10-CM

## 2022-11-11 DIAGNOSIS — Z7985 Long-term (current) use of injectable non-insulin antidiabetic drugs: Secondary | ICD-10-CM | POA: Diagnosis not present

## 2022-11-11 DIAGNOSIS — Z7984 Long term (current) use of oral hypoglycemic drugs: Secondary | ICD-10-CM

## 2022-11-11 DIAGNOSIS — E7849 Other hyperlipidemia: Secondary | ICD-10-CM

## 2022-11-11 DIAGNOSIS — E66812 Obesity, class 2: Secondary | ICD-10-CM

## 2022-11-11 DIAGNOSIS — Z6838 Body mass index (BMI) 38.0-38.9, adult: Secondary | ICD-10-CM

## 2022-11-11 LAB — POCT GLYCOSYLATED HEMOGLOBIN (HGB A1C): Hemoglobin A1C: 7.1 % — AB (ref 4.0–5.6)

## 2022-11-11 NOTE — Addendum Note (Signed)
Addended by: Pollie Meyer on: 11/11/2022 04:33 PM   Modules accepted: Orders

## 2022-11-11 NOTE — Patient Instructions (Signed)
Please use the following regimen: - Metformin ER 500 mg 2x a day  - Glipizide 5 mg before a larger/later dinner - Jardiance 25 mg before b'fast - Mounjaro 7.5 mg weekly  Please return in 4 months with your sugar log.

## 2022-11-11 NOTE — Progress Notes (Signed)
Patient ID: Ashley Gilmore, female   DOB: 02-18-1965, 57 y.o.   MRN: [id number] HPI: Ashley Gilmore is a 57 y.o.-year-old female, returning for f/u for DM2, dx in GDM in 1995 and 1997, then DM2 in ~2010, non-insulin-dependent, uncontrolled, without long-term complications.  Her husband is also my patient.  Last visit 6 months ago.  Interim history: No increased urination, blurry vision, chest pain.   She recently had an abnormal mammogram but a breast ultrasound did not show worrisome masses. She has a lot of stress - mother sick, also relaxed diet as she was traveling her house and her mother's house.  Reviewed HbA1c levels: Lab Results  Component Value Date   HGBA1C 6.6 (H) 08/03/2022   HGBA1C 6.3 (A) 04/20/2022   HGBA1C 6.2 (A) 12/08/2021   HGBA1C 6.3 (H) 07/28/2021   HGBA1C 7.0 (A) 04/02/2021   HGBA1C 7.7 (A) 12/23/2020   HGBA1C 7.2 (H) 07/24/2020   HGBA1C 7.2 (A) 07/14/2020   HGBA1C 6.7 (A) 12/25/2019   HGBA1C 7.8 (A) 06/05/2019   HGBA1C 7.8 (A) 09/26/2018   HGBA1C 7.4 (A) 02/28/2018   HGBA1C 7.4 (H) 10/31/2017   HGBA1C 7.5 (A) 09/27/2017   HGBA1C 7.0 (H) 04/11/2017   HGBA1C 6.3 11/25/2016   HGBA1C 6.4 (H) 08/09/2016   HGBA1C 7.7 04/20/2016   HGBA1C 6.9 12/05/2015   HGBA1C 6.8 07/29/2015  04/30/2014: HbA1c 7.5%.  Pt is on a regimen of: - Metformin XR 500 mg 2x a day >> 1...>> 500 mg 2x a day >> 1000 mg with dinner >> 500 mg 2x a day - Jardiance 25 mg before b'fast - Mounjaro 5 >> 7.5 mg weekly - Glipizide instant release 5 mg before dinner >> only before larger meals Stopped Invokana 100 mg daily in am b/c of an yeast inf- started 02/2014  She tried regular Metformin when prediabetic >> severe diarrhea. Before Mounjaro, [name], and then Trulicity. She was previously on glipizide XL.  Pt checks her sugars once a day: - am: 128-144 >> 118-147, 151 >> 108-135, 140 >> 119-142 >> 130s, 170 - 2h after b'fast:n/c >> 184  >> n/c  - before lunch:111-128 >> n/c >> 79 >>  101, 117 >> n/c - 2h after lunch:131 >> 163 >> n/c >> 117 >> n/c  - before dinner: 77, 79, 96-111 >> 68-117, 124 >> 82-132 >> 99-130s - 2h after dinner:  88-172 >> 138 >> 98, 104 >> n/c - bedtime: 99-145 >> 103-140 >> 101-140 >> 116-140 >> 160-170 - nighttime: n/c >> 70-130 >> n/c >> 84-115 >> n/c Lowest sugar was  68 >> 82 >> 99; she has hypoglycemia awareness in the 70s. Highest sugar was 210 >> 145 ...>> 142 >> 170.  Glucometer:  [employer] Next  Pt's meals are: - Breakfast: protein bar - mid-am snack: apple + PB - Lunch: sandwich or salad - Dinner: meat + green veggie + starch - Snacks: 2 fruit, [name], [name], last BUN/creatinine:  Lab Results  Component Value Date   BUN 15 08/05/2022   CREATININE 0.74 08/05/2022   Lab Results  Component Value Date   MICRALBCREAT NOTE 08/03/2022   MICRALBCREAT 1.3 09/26/2018   MICRALBCREAT 4 10/31/2017   MICRALBCREAT NOTE 04/11/2017   MICRALBCREAT 3 02/12/2016  On losartan.  -+ HL; last set of lipids: Lab Results  Component Value Date   CHOL 200 (H) 08/03/2022   HDL 63 08/03/2022   LDLCALC 111 (  H) 08/03/2022   LDLDIRECT 154.0 09/26/2018   TRIG 151 (H) 08/03/2022   CHOLHDL 3.2 08/03/2022  Previously on Zocor-muscle cramps >> now on Crestor 5 mg 3-5x a week.  - last eye exam was on 06/26/2021: No DR reportedly.  - No numbness and tingling in her feet.  Last foot exam 04/20/2022.  She quit smoking >> on Chantix.  ROS: + see HPI  I reviewed pt's medications, allergies, [name], social hx, family hx, [name], unchanged from my initial visit note.  Past Medical History:  Diagnosis Date   Allergy    Diabetes mellitus without complication (HCC)    Hyperlipidemia    Insomnia    Obesity    Past Surgical History:  Procedure Laterality Date   BREAST BIOPSY     oral surgeries     TUBAL LIGATION     History   Social  History   Marital Status: married    Spouse Name: N/A    Number of Children: 2   Occupational History   Scientist, research (life sciences)   Social History Main Topics   Smoking status: Current Every Day Smoker -- 0.5 packs/day    Types: Cigarettes   Smokeless tobacco: Never Used   Alcohol Use: No   Drug Use: No   Current Outpatient Medications on File Prior to Visit  Medication Sig Dispense Refill   albuterol (VENTOLIN HFA) 108 (90 Base) MCG/ACT inhaler Inhale 2 puffs into the lungs every 6 hours as needed for wheezing or shortness of breath. 6.7 g 3   b complex vitamins tablet Take 1 tablet by mouth daily.     Blood Glucose Monitoring Suppl (FREESTYLE LITE) DEVI Use to check blood sugar once a day. 1 each 0   cetirizine (ZYRTEC) 10 MG tablet Take 10 mg by mouth daily.       cholecalciferol (VITAMIN D) 1000 UNITS tablet Take 1,000 Units by mouth daily.      empagliflozin (JARDIANCE) 25 MG TABS tablet TAKE 1 TABLET BY MOUTH DAILY BEFORE BREAKFAST. 90 tablet 3   glipiZIDE (GLUCOTROL) 5 MG tablet Take 1 tablet (5 mg total) by mouth daily before supper. 90 tablet 3   glucose blood (FREESTYLE LITE) test strip Use to check blood sugar daily 100 strip 12   Lancets (FREESTYLE) lancets Use to check blood sugar once a day. 100 each 12   losartan (COZAAR) 50 MG tablet Take 1 tablet (50 mg total) by mouth daily. 90 tablet 3   metFORMIN (GLUCOPHAGE-XR) 500 MG 24 hr tablet Take 1 tablet (500 mg total) by mouth 2 (two) times daily with a meal. 180 tablet 1   nystatin-triamcinolone (MYCOLOG II) cream Apply to corners of mouth 4 times a day 15 g 1   rosuvastatin (CRESTOR) 5 MG tablet Take 1 tablet (5 mg total) by mouth 3 (three) times a week. 36 tablet 3   tirzepatide (MOUNJARO) 7.5 MG/0.5ML Pen Inject 7.5 mg (0.5 ml) into the skin once a week. 2 mL 2   No current facility-administered medications on file prior to visit.   Allergies  Allergen Reactions   Metformin And Related Diarrhea   Penicillins Rash    Family History  Problem Relation Age of Onset   Breast cancer Mother 63   Diabetes Mother    Hypertension Mother    Diabetes Father    Hypertension Father    Hypertension Sister    Colon cancer Maternal Grandmother    Esophageal  cancer Neg Hx    Stomach cancer Neg Hx    Rectal cancer Neg Hx    PE: BP 124/70   Pulse 97   Ht 5' 5.5" (1.664 m)   Wt 233 lb 6.4 oz (105.9 kg)   SpO2 95%   BMI 38.25 kg/m    Wt Readings from Last 3 Encounters:  11/11/22 233 lb 6.4 oz (105.9 kg)  08/05/22 234 lb 12 oz (106.5 kg)  04/20/22 239 lb (108.4 kg)   Constitutional: overweight, in NAD Eyes: EOMI, no exophthalmos ENT: no thyromegaly, no cervical lymphadenopathy Cardiovascular: tachycardia, RR, No MRG Respiratory: CTA B Musculoskeletal: no deformities Skin: no rashes Neurological: no tremor with outstretched hands  ASSESSMENT: 1. DM2, non-insulin-dependent, uncontrolled, without long term complications, but with am hyperglycemia  2. Obesity class 2  3. HL  PLAN:  1. Patient with longstanding, uncontrolled, type 2 diabetes, on oral antidiabetic regimen with metformin, sulfonylurea, SGLT2 inhibitor and weekly GLP-1/GIP receptor agonist, with significant improvement in diabetes control after adding Mounjaro.  At last visit, sugars are at goal slightly above in the morning and they were well-controlled later in the day.  She was taking glipizide before dinner almost every day, and I advised her to take this only before larger meals.  Otherwise, we did not change the regimen. -At today's visit, sugars appear to be mostly at goal, with only occasional hyperglycemic exceptions.  She has been stressed and busy with her mother being sick since last visit which could have influence her blood sugars, my suggestion was to continue the current regimen, and especially with the holidays approaching, do not forget to take the glipizide before a larger or later dinner. - I suggested to:  Patient  Instructions  Please use the following regimen: - Metformin ER 500 mg 2x a day  - Glipizide 5 mg before a larger/later dinner - Jardiance 25 mg before b'fast - Mounjaro 7.5 mg weekly  Please return in 4 months with your sugar log.  - we checked her HbA1c: 7.1% (higher) - advised to check sugars at different times of the day - 1x a day, rotating check times - advised for yearly eye exams >> she is not UTD - return to clinic in 4 months  2. Obesity class 2 -continue SGLT 2 inhibitor and GLP-1/GIP receptor agonist which should also help with weight loss -At last visit, weight was stable, and since then she lost 1 lb  3. HL -Reviewed latest lipid panel from 3 months ago: LDL above target, otherwise fractions at goal Lab Results  Component Value Date   CHOL 200 (H) 08/03/2022   HDL 63 08/03/2022   LDLCALC 111 (H) 08/03/2022   LDLDIRECT 154.0 09/26/2018   TRIG 151 (H) 08/03/2022   CHOLHDL 3.2 08/03/2022  -He is on Crestor 5 mg several times a week.  She had muscle cramps with Zocor.  She is also on co-Q10 and turmeric.  Carlus Pavlov, MD PhD [facility] Endocrinology

## 2022-11-22 ENCOUNTER — Other Ambulatory Visit: Payer: Self-pay | Admitting: Internal Medicine

## 2022-11-22 ENCOUNTER — Other Ambulatory Visit (HOSPITAL_COMMUNITY): Payer: Self-pay

## 2022-11-22 MED ORDER — GLIPIZIDE 5 MG PO TABS
5.0000 mg | ORAL_TABLET | Freq: Every day | ORAL | 3 refills | Status: DC
  Filled 2022-11-22: qty 90, 90d supply, fill #0
  Filled 2023-02-21: qty 90, 90d supply, fill #1
  Filled 2023-05-23: qty 90, 90d supply, fill #2
  Filled 2023-08-20: qty 90, 90d supply, fill #3

## 2022-11-24 ENCOUNTER — Other Ambulatory Visit (HOSPITAL_COMMUNITY): Payer: Self-pay

## 2022-11-24 ENCOUNTER — Other Ambulatory Visit: Payer: Self-pay | Admitting: Internal Medicine

## 2022-11-24 DIAGNOSIS — E1165 Type 2 diabetes mellitus with hyperglycemia: Secondary | ICD-10-CM

## 2022-11-24 MED ORDER — MOUNJARO 7.5 MG/0.5ML ~~LOC~~ SOAJ
7.5000 mg | SUBCUTANEOUS | 2 refills | Status: DC
  Filled 2022-11-24: qty 2, 28d supply, fill #0
  Filled 2022-12-22: qty 2, 28d supply, fill #1
  Filled 2023-01-14: qty 2, 28d supply, fill #2

## 2022-12-06 ENCOUNTER — Encounter: Payer: Self-pay | Admitting: Gastroenterology

## 2022-12-24 ENCOUNTER — Other Ambulatory Visit (HOSPITAL_COMMUNITY): Payer: Self-pay

## 2023-01-05 ENCOUNTER — Encounter: Payer: Self-pay | Admitting: Internal Medicine

## 2023-01-05 NOTE — Telephone Encounter (Signed)
 Care team updated and letter sent for eye exam notes.

## 2023-01-18 ENCOUNTER — Other Ambulatory Visit (HOSPITAL_COMMUNITY): Payer: Self-pay

## 2023-01-27 ENCOUNTER — Other Ambulatory Visit (HOSPITAL_COMMUNITY): Payer: Self-pay

## 2023-02-02 ENCOUNTER — Other Ambulatory Visit (HOSPITAL_COMMUNITY): Payer: Self-pay

## 2023-02-11 ENCOUNTER — Other Ambulatory Visit: Payer: Self-pay | Admitting: Internal Medicine

## 2023-02-11 ENCOUNTER — Other Ambulatory Visit (HOSPITAL_COMMUNITY): Payer: Self-pay

## 2023-02-11 DIAGNOSIS — E1165 Type 2 diabetes mellitus with hyperglycemia: Secondary | ICD-10-CM

## 2023-02-11 MED ORDER — MOUNJARO 7.5 MG/0.5ML ~~LOC~~ SOAJ
7.5000 mg | SUBCUTANEOUS | 11 refills | Status: DC
  Filled 2023-02-11: qty 2, 28d supply, fill #0
  Filled 2023-03-11: qty 2, 28d supply, fill #1
  Filled 2023-04-08: qty 2, 28d supply, fill #2
  Filled 2023-05-06: qty 2, 28d supply, fill #3

## 2023-03-15 ENCOUNTER — Other Ambulatory Visit (HOSPITAL_BASED_OUTPATIENT_CLINIC_OR_DEPARTMENT_OTHER): Payer: Self-pay

## 2023-03-15 ENCOUNTER — Other Ambulatory Visit (HOSPITAL_COMMUNITY): Payer: Self-pay

## 2023-03-15 ENCOUNTER — Other Ambulatory Visit: Payer: Self-pay | Admitting: Internal Medicine

## 2023-03-15 MED ORDER — METFORMIN HCL ER 500 MG PO TB24
500.0000 mg | ORAL_TABLET | Freq: Two times a day (BID) | ORAL | 1 refills | Status: DC
  Filled 2023-03-15: qty 180, 90d supply, fill #0
  Filled 2023-06-13: qty 180, 90d supply, fill #1

## 2023-03-18 ENCOUNTER — Other Ambulatory Visit (HOSPITAL_COMMUNITY): Payer: Self-pay

## 2023-03-18 ENCOUNTER — Ambulatory Visit: Payer: Commercial Managed Care - PPO | Admitting: Internal Medicine

## 2023-05-05 ENCOUNTER — Other Ambulatory Visit (HOSPITAL_COMMUNITY): Payer: Self-pay

## 2023-05-16 ENCOUNTER — Encounter: Payer: Self-pay | Admitting: Internal Medicine

## 2023-05-16 ENCOUNTER — Other Ambulatory Visit (HOSPITAL_COMMUNITY): Payer: Self-pay

## 2023-05-16 ENCOUNTER — Ambulatory Visit: Payer: Commercial Managed Care - PPO | Admitting: Internal Medicine

## 2023-05-16 VITALS — BP 140/80 | HR 92 | Ht 65.5 in | Wt 227.0 lb

## 2023-05-16 DIAGNOSIS — E7849 Other hyperlipidemia: Secondary | ICD-10-CM

## 2023-05-16 DIAGNOSIS — E66812 Obesity, class 2: Secondary | ICD-10-CM | POA: Diagnosis not present

## 2023-05-16 DIAGNOSIS — Z7985 Long-term (current) use of injectable non-insulin antidiabetic drugs: Secondary | ICD-10-CM | POA: Diagnosis not present

## 2023-05-16 DIAGNOSIS — E1165 Type 2 diabetes mellitus with hyperglycemia: Secondary | ICD-10-CM | POA: Diagnosis not present

## 2023-05-16 DIAGNOSIS — Z7984 Long term (current) use of oral hypoglycemic drugs: Secondary | ICD-10-CM

## 2023-05-16 DIAGNOSIS — Z6838 Body mass index (BMI) 38.0-38.9, adult: Secondary | ICD-10-CM

## 2023-05-16 LAB — POCT GLYCOSYLATED HEMOGLOBIN (HGB A1C): Hemoglobin A1C: 5.8 % — AB (ref 4.0–5.6)

## 2023-05-16 MED ORDER — EMPAGLIFLOZIN 25 MG PO TABS
ORAL_TABLET | Freq: Every day | ORAL | 3 refills | Status: AC
  Filled 2023-05-16: qty 90, fill #0
  Filled 2023-07-25: qty 90, 90d supply, fill #0
  Filled 2023-10-23: qty 90, 90d supply, fill #1
  Filled 2024-01-23: qty 90, 90d supply, fill #2

## 2023-05-16 MED ORDER — MOUNJARO 7.5 MG/0.5ML ~~LOC~~ SOAJ
7.5000 mg | SUBCUTANEOUS | 11 refills | Status: AC
  Filled 2023-05-16 – 2023-06-03 (×2): qty 2, 28d supply, fill #0
  Filled 2023-06-30: qty 2, 28d supply, fill #1
  Filled 2023-07-28: qty 2, 28d supply, fill #2
  Filled 2023-08-24: qty 2, 28d supply, fill #3
  Filled 2023-09-20: qty 2, 28d supply, fill #4
  Filled 2023-10-17: qty 2, 28d supply, fill #5
  Filled 2023-11-11: qty 2, 28d supply, fill #6
  Filled 2023-12-06: qty 2, 28d supply, fill #7
  Filled 2024-01-03: qty 2, 28d supply, fill #8
  Filled 2024-01-30: qty 2, 28d supply, fill #9
  Filled 2024-02-24: qty 2, 28d supply, fill #10

## 2023-05-16 NOTE — Progress Notes (Signed)
 Patient ID: Ashley Gilmore, female   DOB: 04/07/1965, 58 y.o.   MRN: [id number] HPI: Ashley Gilmore is a 58 y.o.-year-old female, returning for f/u for DM2, dx in GDM in 1995 and 1997, then DM2 in ~2010, non-insulin -dependent, uncontrolled, without long-term complications.  Her husband is also my patient.  Last visit 6 months ago.  Interim history: No increased urination, blurry vision, chest pain.   She has muscle cramps in back and forearms. She started Turmeric + black pepper.  Reviewed HbA1c levels: Lab Results  Component Value Date   HGBA1C 7.1 (A) 11/11/2022   HGBA1C 6.6 (H) 08/03/2022   HGBA1C 6.3 (A) 04/20/2022   HGBA1C 6.2 (A) 12/08/2021   HGBA1C 6.3 (H) 07/28/2021   HGBA1C 7.0 (A) 04/02/2021   HGBA1C 7.7 (A) 12/23/2020   HGBA1C 7.2 (H) 07/24/2020   HGBA1C 7.2 (A) 07/14/2020   HGBA1C 6.7 (A) 12/25/2019   HGBA1C 7.8 (A) 06/05/2019   HGBA1C 7.8 (A) 09/26/2018   HGBA1C 7.4 (A) 02/28/2018   HGBA1C 7.4 (H) 10/31/2017   HGBA1C 7.5 (A) 09/27/2017   HGBA1C 7.0 (H) 04/11/2017   HGBA1C 6.3 11/25/2016   HGBA1C 6.4 (H) 08/09/2016   HGBA1C 7.7 04/20/2016   HGBA1C 6.9 12/05/2015  04/30/2014: HbA1c 7.5%.  Pt is on a regimen of: - Metformin  XR 500 mg 2x a day >> 1...>> 500 mg 2x a day >> 1000 mg with dinner >> 500 mg 2x a day - Jardiance  25 mg before b'fast - Mounjaro  5 >> 7.5 mg weekly - Glipizide  instant release 5 mg before dinner >> only before larger meals Stopped Invokana  100 mg daily in am b/c of an yeast inf- started 02/2014  She tried regular Metformin  when prediabetic >> severe diarrhea. Before Mounjaro , [name] , [name], 140 >> 119-142 >> 130s, 170 >> 120-130s - 2h after b'fast:n/c >> 184  >> n/c  - before lunch:79 >> 101, 117 >> n/c - 2h after lunch:131 >> 163 >> n/c >> 117 >> n/c  - before dinner:  68-117, 124 >> 82-132 >> 99-130s >> 98-125 - 2h after dinner:  88-172  >> 138 >> 98, [name]; [name], [name], [name], last BUN/creatinine:  Lab Results  Component Value Date   BUN 15 08/05/2022   CREATININE 0.74 08/05/2022   Lab Results  Component Value Date   MICRALBCREAT NOTE 08/03/2022   MICRALBCREAT 1.3 09/26/2018   MICRALBCREAT 4 10/31/2017   MICRALBCREAT NOTE 04/11/2017   MICRALBCREAT 3 02/12/2016  On losartan .  -+ HL; last set of lipids: Lab Results  Component Value Date   CHOL 200 (H) 08/03/2022   HDL 63 08/03/2022   LDLCALC 111 (H) 08/03/2022   LDLDIRECT 154.0 09/26/2018   TRIG 151 (H) 08/03/2022   CHOLHDL 3.2 08/03/2022  Previously on Zocor -muscle cramps >> now on Crestor  5 mg 3-5x a week.  - last eye  exam was on 09/14/2022: No DR.  - No numbness and tingling in her feet.  Last foot exam 04/20/2022.  She quit smoking >> on Chantix .  ROS: + see HPI  I reviewed pt's medications, allergies, [name], social hx, family hx, [name], [name], [name],000 Units by mouth daily.      empagliflozin  (JARDIANCE ) 25 MG TABS tablet TAKE 1 TABLET BY MOUTH DAILY BEFORE BREAKFAST. 90 tablet 3   glipiZIDE  (GLUCOTROL ) 5 MG tablet Take 1 tablet (5 mg total) by mouth daily before supper. 90 tablet 3   glucose blood (FREESTYLE LITE) test strip Use to check blood sugar daily 100 strip 12   Lancets (FREESTYLE) lancets Use to check blood sugar once a day. 100 each 12   losartan  (COZAAR ) 50 MG tablet Take 1 tablet (50 mg total) by mouth daily. 90 tablet 3   metFORMIN  (GLUCOPHAGE -XR) 500 MG 24 hr tablet Take 1 tablet (500 mg total) by mouth 2 (two) times daily with a meal. 180 tablet 1   nystatin -triamcinolone  (MYCOLOG II) cream Apply to corners of mouth 4 times a day 15 g 1   rosuvastatin  (CRESTOR ) 5 MG tablet Take 1 tablet (5 mg total) by mouth 3 (three) times a week. 36 tablet 3   tirzepatide  (MOUNJARO ) 7.5 MG/0.5ML Pen Inject 7.5 mg (0.5 ml) into the skin once a week. 2 mL 11   No current facility-administered medications on file prior to visit.   Allergies  Allergen Reactions   Metformin  And Related Diarrhea   Penicillins Rash   Family History  Problem Relation Age of Onset   Breast cancer Mother 43   Diabetes Mother    Hypertension Mother    Diabetes Father    Hypertension Father    Hypertension Sister    Colon cancer  Maternal Grandmother    Esophageal cancer Neg Hx    Stomach cancer Neg Hx    Rectal cancer Neg Hx    PE: BP (!) 140/80   Pulse 92   Ht 5' 5.5" (1.664 m)  Wt 227 lb (103 kg)   SpO2 96%   BMI 37.20 kg/m    Wt Readings from Last 3 Encounters:  05/16/23 227 lb (103 kg)  11/11/22 233 lb 6.4 oz (105.9 kg)  08/05/22 234 lb 12 oz (106.5 kg)   Constitutional: overweight, in NAD Eyes: EOMI, no exophthalmos ENT: no thyromegaly, no cervical lymphadenopathy Cardiovascular: RRR, No MRG Respiratory: CTA B Musculoskeletal: no deformities Skin: no rashes Neurological: no tremor with outstretched hands Diabetic Foot Exam - Simple   Simple Foot Form Diabetic Foot exam was performed with the following findings: Yes 05/16/2023  4:09 PM  Visual Inspection No deformities, no ulcerations, no other skin breakdown bilaterally: Yes Sensation Testing Intact to touch and monofilament testing bilaterally: Yes Pulse Check Posterior Tibialis and Dorsalis pulse intact bilaterally: Yes Comments    ASSESSMENT: 1. DM2, non-insulin -dependent, uncontrolled, without long term complications, but with am hyperglycemia  2. Obesity class 2  3. HL  PLAN:  1. Patient with longstanding, uncontrolled, on metformin , sulfonylurea, SGLT2 inhibitor and GLP-1/GIP receptor agonist, with significant improvement in control after adding Mounjaro .  At last visit sugars were mostly at goal with only occasional hyperglycemic exceptions.  She was stressed and busy with her mother being sick which could have influenced the blood sugars.  We did not change the regimen at that time.  HbA1c was slightly higher than, at 7.1%. -At today's visit, sugars are mostly at goal with occasional slightly higher values, in the 130s, in the morning.  She is not checking blood sugars after meals, but I suspect that these are quite well-controlled based on the HbA1c obtained today (see below).  I would not change her regimen now.  She uses  glipizide  occasionally, only when having a large meal.  Will continue this for now.  If sugars remain well-controlled, plan to stop this at next visit. - I suggested to:  Patient Instructions  Please use the following regimen: - Metformin  ER 500 mg 2x a day  - Glipizide  5 mg before a larger/later dinner - Jardiance  25 mg before b'fast - Mounjaro  7.5 mg weekly  Please return in 4 months with your sugar log.  - we checked her HbA1c: 5.8% (lowest in a long time!) - advised to check sugars at different times of the day - 1x a day, rotating check times - advised for yearly eye exams >> she is UTD - return to clinic in 4 months  2. Obesity class 2 -continue SGLT 2 inhibitor and GLP-1/GIP receptor agonist which should also help with weight loss - Weight was stable in the last 2 visits and she lost 6 pounds since last visit  3. HL - Reviewed latest lipid panel from 07/2022: LDL above target, otherwise fractions at goal: Lab Results  Component Value Date   CHOL 200 (H) 08/03/2022   HDL 63 08/03/2022   LDLCALC 111 (H) 08/03/2022   LDLDIRECT 154.0 09/26/2018   TRIG 151 (H) 08/03/2022   CHOLHDL 3.2 08/03/2022  - She takes Crestor  5 mg 3-5 times a week.  She had muscle cramps with Zocor .  She is also on co-Q10 and turmeric.  Emilie Harden, MD PhD [facility] End[facility]

## 2023-05-16 NOTE — Patient Instructions (Addendum)
-  Please use the following regimen: - Metformin  ER 500 mg 2x a day  - Glipizide  5 mg before a larger/later dinner - Jardiance  25 mg before b'fast - Mounjaro  7.5 mg weekly  Please return in 4-6 months with your sugar log.

## 2023-05-17 ENCOUNTER — Other Ambulatory Visit (HOSPITAL_COMMUNITY): Payer: Self-pay

## 2023-05-18 ENCOUNTER — Other Ambulatory Visit (HOSPITAL_COMMUNITY): Payer: Self-pay

## 2023-06-03 ENCOUNTER — Other Ambulatory Visit (HOSPITAL_COMMUNITY): Payer: Self-pay

## 2023-06-17 ENCOUNTER — Other Ambulatory Visit: Payer: Self-pay | Admitting: Internal Medicine

## 2023-06-17 ENCOUNTER — Other Ambulatory Visit (HOSPITAL_COMMUNITY): Payer: Self-pay

## 2023-06-17 MED ORDER — FREESTYLE LITE TEST VI STRP
ORAL_STRIP | Freq: Every day | 12 refills | Status: AC
  Filled 2023-06-17: qty 100, 90d supply, fill #0

## 2023-06-24 ENCOUNTER — Other Ambulatory Visit (HOSPITAL_COMMUNITY): Payer: Self-pay

## 2023-07-04 ENCOUNTER — Other Ambulatory Visit (HOSPITAL_COMMUNITY): Payer: Self-pay

## 2023-07-25 ENCOUNTER — Other Ambulatory Visit: Payer: Self-pay

## 2023-07-25 ENCOUNTER — Other Ambulatory Visit: Payer: Self-pay | Admitting: Internal Medicine

## 2023-07-25 ENCOUNTER — Other Ambulatory Visit (HOSPITAL_COMMUNITY): Payer: Self-pay

## 2023-07-27 ENCOUNTER — Other Ambulatory Visit (HOSPITAL_COMMUNITY): Payer: Self-pay

## 2023-07-27 MED ORDER — LOSARTAN POTASSIUM 50 MG PO TABS
50.0000 mg | ORAL_TABLET | Freq: Every day | ORAL | 3 refills | Status: AC
  Filled 2023-07-27: qty 90, 90d supply, fill #0
  Filled 2023-10-23: qty 90, 90d supply, fill #1
  Filled 2024-01-23: qty 90, 90d supply, fill #2

## 2023-08-09 ENCOUNTER — Other Ambulatory Visit: Payer: Commercial Managed Care - PPO

## 2023-08-09 DIAGNOSIS — Z0000 Encounter for general adult medical examination without abnormal findings: Secondary | ICD-10-CM

## 2023-08-09 DIAGNOSIS — E119 Type 2 diabetes mellitus without complications: Secondary | ICD-10-CM

## 2023-08-09 DIAGNOSIS — Z1329 Encounter for screening for other suspected endocrine disorder: Secondary | ICD-10-CM

## 2023-08-09 DIAGNOSIS — E7800 Pure hypercholesterolemia, unspecified: Secondary | ICD-10-CM

## 2023-08-09 DIAGNOSIS — E1159 Type 2 diabetes mellitus with other circulatory complications: Secondary | ICD-10-CM

## 2023-08-09 DIAGNOSIS — I152 Hypertension secondary to endocrine disorders: Secondary | ICD-10-CM | POA: Diagnosis not present

## 2023-08-10 ENCOUNTER — Ambulatory Visit: Payer: Self-pay | Admitting: Internal Medicine

## 2023-08-10 LAB — LIPID PANEL
Cholesterol: 196 mg/dL (ref <200)
HDL: 64 mg/dL (ref >=50)
LDL Cholesterol (Calc): 101 mg/dL — ABNORMAL HIGH
Non-HDL Cholesterol (Calc): 132 mg/dL — ABNORMAL HIGH (ref <130)
Total CHOL/HDL Ratio: 3.1 (calc) (ref <5.0)
Triglycerides: 185 mg/dL — ABNORMAL HIGH (ref <150)

## 2023-08-10 LAB — CBC WITH DIFFERENTIAL/PLATELET
Absolute Lymphocytes: 1508 {cells}/uL (ref 850–3900)
Absolute Monocytes: 331 {cells}/uL (ref 200–950)
Basophils Absolute: 52 {cells}/uL (ref 0–200)
Basophils Relative: 0.9 %
Eosinophils Absolute: 209 {cells}/uL (ref 15–500)
Eosinophils Relative: 3.6 %
HCT: 49.4 % — ABNORMAL HIGH (ref 35.0–45.0)
Hemoglobin: 16 g/dL — ABNORMAL HIGH (ref 11.7–15.5)
MCH: 28.1 pg (ref 27.0–33.0)
MCHC: 32.4 g/dL (ref 32.0–36.0)
MCV: 86.7 fL (ref 80.0–100.0)
MPV: 9.8 fL (ref 7.5–12.5)
Monocytes Relative: 5.7 %
Neutro Abs: 3700 {cells}/uL (ref 1500–7800)
Neutrophils Relative %: 63.8 %
Platelets: 283 Thousand/uL (ref 140–400)
RBC: 5.7 Million/uL — ABNORMAL HIGH (ref 3.80–5.10)
RDW: 13.1 % (ref 11.0–15.0)
Total Lymphocyte: 26 %
WBC: 5.8 Thousand/uL (ref 3.8–10.8)

## 2023-08-10 LAB — HEMOGLOBIN A1C
Hgb A1c MFr Bld: 6.6 % — ABNORMAL HIGH (ref <5.7)
Mean Plasma Glucose: 143 mg/dL
eAG (mmol/L): 7.9 mmol/L

## 2023-08-10 LAB — COMPLETE METABOLIC PANEL WITHOUT GFR
AG Ratio: 1.8 (calc) (ref 1.0–2.5)
ALT: 29 U/L (ref 6–29)
AST: 21 U/L (ref 10–35)
Albumin: 4.4 g/dL (ref 3.6–5.1)
Alkaline phosphatase (APISO): 86 U/L (ref 37–153)
BUN: 13 mg/dL (ref 7–25)
CO2: 31 mmol/L (ref 20–32)
Calcium: 10 mg/dL (ref 8.6–10.4)
Chloride: 103 mmol/L (ref 98–110)
Creat: 0.64 mg/dL (ref 0.50–1.03)
Globulin: 2.4 g/dL (ref 1.9–3.7)
Glucose, Bld: 121 mg/dL — ABNORMAL HIGH (ref 65–99)
Potassium: 5.7 mmol/L — ABNORMAL HIGH (ref 3.5–5.3)
Sodium: 142 mmol/L (ref 135–146)
Total Bilirubin: 0.4 mg/dL (ref 0.2–1.2)
Total Protein: 6.8 g/dL (ref 6.1–8.1)

## 2023-08-10 LAB — MICROALBUMIN / CREATININE URINE RATIO
Creatinine, Urine: 12 mg/dL — ABNORMAL LOW (ref 20–275)
Microalb, Ur: <0.2 mg/dL

## 2023-08-10 LAB — TSH: TSH: 1.89 m[IU]/L (ref 0.40–4.50)

## 2023-08-12 ENCOUNTER — Ambulatory Visit: Payer: Commercial Managed Care - PPO | Admitting: Internal Medicine

## 2023-08-12 ENCOUNTER — Other Ambulatory Visit (HOSPITAL_COMMUNITY): Payer: Self-pay

## 2023-08-12 ENCOUNTER — Encounter: Payer: Self-pay | Admitting: Internal Medicine

## 2023-08-12 VITALS — BP 110/70 | HR 94 | Ht 65.5 in | Wt 218.0 lb

## 2023-08-12 DIAGNOSIS — Z860100 Personal history of colon polyps, unspecified: Secondary | ICD-10-CM

## 2023-08-12 DIAGNOSIS — F5101 Primary insomnia: Secondary | ICD-10-CM | POA: Diagnosis not present

## 2023-08-12 DIAGNOSIS — J4520 Mild intermittent asthma, uncomplicated: Secondary | ICD-10-CM

## 2023-08-12 DIAGNOSIS — E7800 Pure hypercholesterolemia, unspecified: Secondary | ICD-10-CM

## 2023-08-12 DIAGNOSIS — Z6835 Body mass index (BMI) 35.0-35.9, adult: Secondary | ICD-10-CM

## 2023-08-12 DIAGNOSIS — E119 Type 2 diabetes mellitus without complications: Secondary | ICD-10-CM | POA: Diagnosis not present

## 2023-08-12 DIAGNOSIS — I10 Essential (primary) hypertension: Secondary | ICD-10-CM | POA: Diagnosis not present

## 2023-08-12 DIAGNOSIS — Z0000 Encounter for general adult medical examination without abnormal findings: Secondary | ICD-10-CM

## 2023-08-12 DIAGNOSIS — E785 Hyperlipidemia, unspecified: Secondary | ICD-10-CM

## 2023-08-12 DIAGNOSIS — F419 Anxiety disorder, unspecified: Secondary | ICD-10-CM | POA: Diagnosis not present

## 2023-08-12 DIAGNOSIS — I152 Hypertension secondary to endocrine disorders: Secondary | ICD-10-CM

## 2023-08-12 DIAGNOSIS — Z8639 Personal history of other endocrine, nutritional and metabolic disease: Secondary | ICD-10-CM

## 2023-08-12 DIAGNOSIS — J301 Allergic rhinitis due to pollen: Secondary | ICD-10-CM | POA: Diagnosis not present

## 2023-08-12 MED ORDER — ALPRAZOLAM 0.25 MG PO TABS
0.2500 mg | ORAL_TABLET | Freq: Two times a day (BID) | ORAL | 0 refills | Status: AC | PRN
  Filled 2023-08-12: qty 30, 15d supply, fill #0

## 2023-08-12 NOTE — Progress Notes (Incomplete)
 Annual Wellness Visit   Patient Care Team: , Ronal PARAS, MD as PCP - General (Internal Medicine) Trixie File, MD as Consulting Physician (Internal Medicine) [facility] [place] (Optometry)  Visit Date: 08/12/23   Chief Complaint  Patient presents with   Annual Exam   Subjective:  Patient: Ashley Gilmore, Female DOB: March 24, 1965, 58 y.o. MRN: [id number] Vitals:   08/12/23 1057  BP: 110/70   Ashley Gilmore is a 58 y.o. Female who presents today for her Annual Wellness Visit. Patient has Insomnia; History of migraine headaches; Class 2 severe obesity with serious comorbidity and body mass index (BMI) of 38.0 to 38.9 in adult [facility]); Asthma; Allergic rhinitis; Hyperlipidemia; History of smoking; GE reflux; Metabolic syndrome; and Type 2 diabetes mellitus with hyperglycemia (HCC) on their problem list.  History of Hyperlipidemia treated with Losartan  50 mg daily. ***/***/*** Lipid Panel *** Lab Results  Component Value Date   CHOL 196 08/09/2023   HDL 64 08/09/2023   LDLCALC 101 (H) 08/09/2023   TRIG 185 (H) 08/09/2023    History of Diabetes Mellitus, type II treated with Jardiance  25 mg daily, Glipizide  5 mg daily, and Mounjaro  7.5 mg injected weekly. 08/09/2023 HgbA1c ***, Glucose ***, Microalbumin: WNL at <0.2.  Lab Results  Component Value Date   GLUCOSE 121 (H) 08/09/2023   HGBA1C 6.6 (H) 08/09/2023    History of Obesity; BMI *** Wt Readings from Last 3 Encounters:  08/12/23 218 lb (98.9 kg)  05/16/23 227 lb (103 kg)  11/11/22 233 lb 6.4 oz (105.9 kg)   BMI Readings from Last 3 Encounters:  08/12/23 35.73 kg/m  05/16/23 37.20 kg/m  11/11/22 38.25 kg/m   History of *** treated with  History of *** treated with  History of *** treated with  History of *** treated with   Current Outpatient Medications  Medication Instructions   albuterol  (VENTOLIN  HFA) 108 (90 Base) MCG/ACT inhaler Inhale 2 puffs into the lungs every 6 hours as needed for wheezing or  shortness of breath.   ALPRAZolam  (XANAX ) 0.25 mg, Oral, 2 times daily PRN   b complex vitamins tablet 1 tablet, Daily   Blood Glucose Monitoring Suppl (FREESTYLE LITE) DEVI Use to check blood sugar once a day.   cetirizine (ZYRTEC) 10 mg, Daily   cholecalciferol (VITAMIN D ) 1,000 Units, Daily   empagliflozin  (JARDIANCE ) 25 MG TABS tablet TAKE 1 TABLET BY MOUTH DAILY BEFORE BREAKFAST.   glipiZIDE  (GLUCOTROL ) 5 mg, Oral, Daily before supper   glucose blood (FREESTYLE LITE) test strip Use to check blood sugar daily   Lancets (FREESTYLE) lancets Use to check blood sugar once a day.   losartan  (COZAAR ) 50 mg, Oral, Daily   metFORMIN  (GLUCOPHAGE -XR) 500 mg, Oral, 2 times daily with meals   NON FORMULARY TURMERIC 1500 MG AND BLACK PEPPER 10 MG CAPSULE   nystatin -triamcinolone  (MYCOLOG II) cream Apply to corners of mouth 4 times a day   rosuvastatin  (CRESTOR ) 5 mg, Oral, 3 times weekly   tirzepatide  (MOUNJARO ) 7.5 MG/0.5ML Pen Inject 7.5 mg (0.5 ml) into the skin once a week.   Labs 08/09/2023 CBC: RBC 5.70; Hemoglobin 16.0; HCT 49.4; otherwise WNL.  C-MET: Potassium 5.7; Glucose 121; otherwise WNL TSH: 1.89   PAP Smear 08/05/2022 normal with repeat recommendation of 2027.  Mammogram 08/30/2022 ***; subsequent Right Breast US  *** and Diagnostic Mammogram *** with repeat recommendation of 2025.  Overdue since 2024; last Colonoscopy 10/25/2019 removed a 4 mm semi-pedunculated polyp from the mid transverse colon  and a 10 mm sessile polyp from the distal transverse colon (per pathology hyperplastic polyp(s), 1 w/  features of mucosal prolapse, but negative for dysplasia); otherwise normal exam.  Bone Density will be due 2031.   Vaccine Counseling: Due for Covid-19, Shingrix 1st dose, PNA, Tdap, Hepatitis B, and HIV Screening - discussed, she plans on updating neccessary vaccines at local pharmacy; UTD on Influenza. Outpatient Medications Prior to Visit  Medication Sig   albuterol  (VENTOLIN  HFA)  108 (90 Base) MCG/ACT inhaler Inhale 2 puffs into the lungs every 6 hours as needed for wheezing or shortness of breath.   b complex vitamins tablet Take 1 tablet by mouth daily.   Blood Glucose Monitoring Suppl (FREESTYLE LITE) DEVI Use to check blood sugar once a day.   cetirizine (ZYRTEC) 10 MG tablet Take 10 mg by mouth daily.     cholecalciferol (VITAMIN D ) 1000 UNITS tablet Take 1,000 Units by mouth daily.    empagliflozin  (JARDIANCE ) 25 MG TABS tablet TAKE 1 TABLET BY MOUTH DAILY BEFORE BREAKFAST.   glipiZIDE  (GLUCOTROL ) 5 MG tablet Take 1 tablet (5 mg total) by mouth daily before supper.   glucose blood (FREESTYLE LITE) test strip Use to check blood sugar daily   Lancets (FREESTYLE) lancets Use to check blood sugar once a day.   losartan  (COZAAR ) 50 MG tablet Take 1 tablet (50 mg total) by mouth daily.   metFORMIN  (GLUCOPHAGE -XR) 500 MG 24 hr tablet Take 1 tablet (500 mg total) by mouth 2 (two) times daily with a meal.   NON FORMULARY TURMERIC 1500 MG AND BLACK PEPPER 10 MG CAPSULE   nystatin -triamcinolone  (MYCOLOG II) cream Apply to corners of mouth 4 times a day   rosuvastatin  (CRESTOR ) 5 MG tablet Take 1 tablet (5 mg total) by mouth 3 (three) times a week.   tirzepatide  (MOUNJARO ) 7.5 MG/0.5ML Pen Inject 7.5 mg (0.5 ml) into the skin once a week.   No facility-administered medications prior to visit.   Past Medical History:  Diagnosis Date   Allergy    Diabetes mellitus without complication (HCC)    Hyperlipidemia    Insomnia    Obesity    Medical/Surgical History Narrative:  Allergic/Intolerant to: Metformin /Related Medications - diarrhea; Penicillins - rash  *** - ***  *** - ***  *** - ***  *** - ***  *** - ***  *** - ***  *** - ***  *** - *** Past Surgical History:  Procedure Laterality Date   BREAST BIOPSY     oral surgeries     TUBAL LIGATION     Other - Hx of: *** ; Surghx of: *** Family History  Problem Relation Age of Onset   Breast cancer Mother  68   Diabetes Mother    Hypertension Mother    Diabetes Father    Hypertension Father    Hypertension Sister    Colon cancer Maternal Grandmother    Esophageal cancer Neg Hx    Stomach cancer Neg Hx    Rectal cancer Neg Hx    Family History Narrative: family history includes Breast cancer (age of onset: 1) in her mother; Colon cancer in her maternal grandmother; Diabetes in her father and mother; Hypertension in her father, mother, and sister. There is no history of Esophageal cancer, Stomach cancer, or Rectal cancer. Social History   Social History Narrative   Not on file    ROS  Objective:  Vitals: BP 110/70   Pulse 94   Ht 5' 5.5 (1.664  m)   Wt 218 lb (98.9 kg)   SpO2 96%   BMI 35.73 kg/m  Physical Exam Vitals and nursing note reviewed.  Constitutional:      General: She is not in acute distress.    Appearance: Normal appearance. She is not ill-appearing or toxic-appearing.  HENT:     Head: Normocephalic and atraumatic.     Right Ear: Hearing, tympanic membrane, ear canal and external ear normal.     Left Ear: Hearing, tympanic membrane, ear canal and external ear normal.     Mouth/Throat:     Pharynx: Oropharynx is clear.  Eyes:     Extraocular Movements: Extraocular movements intact.     Pupils: Pupils are equal, round, and reactive to light.  Neck:     Thyroid : No thyroid  mass, thyromegaly or thyroid  tenderness.     Vascular: No carotid bruit.  Cardiovascular:     Rate and Rhythm: Normal rate and regular rhythm. No extrasystoles are present.    Pulses:          Dorsalis pedis pulses are 2+ on the right side and 2+ on the left side.     Heart sounds: Normal heart sounds. No murmur heard.    No friction rub. No gallop.  Pulmonary:     Effort: Pulmonary effort is normal.     Breath sounds: Normal breath sounds. No decreased breath sounds, wheezing, rhonchi or rales.  Chest:     Chest wall: No mass.  Breasts:    Right: Normal.     Left: Normal.   Abdominal:     Palpations: Abdomen is soft. There is no hepatomegaly, splenomegaly or mass.     Tenderness: There is no abdominal tenderness.     Hernia: No hernia is present.  Musculoskeletal:     Cervical back: Normal range of motion.     Right lower leg: No edema.     Left lower leg: No edema.  Lymphadenopathy:     Cervical: No cervical adenopathy.     Upper Body:     Right upper body: No supraclavicular adenopathy.     Left upper body: No supraclavicular adenopathy.  Skin:    General: Skin is warm and dry.  Neurological:     General: No focal deficit present.     Mental Status: She is alert and oriented to person, place, and time. Mental status is at baseline.     Sensory: Sensation is intact.     Motor: Motor function is intact. No weakness.     Deep Tendon Reflexes: Reflexes are normal and symmetric.  Psychiatric:        Attention and Perception: Attention normal.        Mood and Affect: Mood normal.        Speech: Speech normal.        Behavior: Behavior normal.        Thought Content: Thought content normal.        Cognition and Memory: Cognition normal.        Judgment: Judgment normal.    Most Recent Functional Status Assessment:     No data to display         Most Recent Fall Risk Assessment:    08/05/2022    2:29 PM  Fall Risk   Falls in the past year? 0  Number falls in past yr: 0  Injury with Fall? 0  Risk for fall due to : No Fall Risks  Follow up Falls evaluation  completed   Most Recent Depression Screenings:    08/12/2023   11:01 AM 08/05/2022    2:30 PM  PHQ 2/9 Scores  PHQ - 2 Score 2 0  PHQ- 9 Score 8    Most Recent Cognitive Screening:     No data to display         Results:  Studies Obtained And Personally Reviewed By Me:  {Imaging, colonoscopy, mammogram, bone density scan, echocardiogram, heart cath, stress test, CT calcium  score, etc.:32292}  Labs:  CBC w/ Differential Lab Results  Component Value Date   WBC 5.8  08/09/2023   RBC 5.70 (H) 08/09/2023   HGB 16.0 (H) 08/09/2023   HCT 49.4 (H) 08/09/2023   PLT 283 08/09/2023   MCV 86.7 08/09/2023   MCH 28.1 08/09/2023   MCHC 32.4 08/09/2023   RDW 13.1 08/09/2023   MPV 9.8 08/09/2023   LYMPHSABS 1,770 08/03/2022   MONOABS CANCELED 02/12/2016   BASOSABS 52 08/09/2023    Comprehensive Metabolic Panel Lab Results  Component Value Date   NA 142 08/09/2023   K 5.7 (H) 08/09/2023   CL 103 08/09/2023   CO2 31 08/09/2023   GLUCOSE 121 (H) 08/09/2023   BUN 13 08/09/2023   CREATININE 0.64 08/09/2023   CALCIUM  10.0 08/09/2023   PROT 6.8 08/09/2023   ALBUMIN 3.9 08/09/2016   AST 21 08/09/2023   ALT 29 08/09/2023   ALKPHOS 110 08/09/2016   BILITOT 0.4 08/09/2023   EGFR 94 08/05/2022   GFRNONAA 82 07/24/2020   Lipid Panel  Lab Results  Component Value Date   CHOL 196 08/09/2023   HDL 64 08/09/2023   LDLCALC 101 (H) 08/09/2023   TRIG 185 (H) 08/09/2023   A1c Lab Results  Component Value Date   HGBA1C 6.6 (H) 08/09/2023    TSH Lab Results  Component Value Date   TSH 1.89 08/09/2023   PSA{PSA (Optional):32132} Assessment & Plan:   No orders of the defined types were placed in this encounter.  Meds ordered this encounter  Medications   ALPRAZolam  (XANAX ) 0.25 MG tablet    Sig: Take 1 tablet (0.25 mg total) by mouth 2 (two) times daily as needed for anxiety.    Dispense:  30 tablet    Refill:  0   Other Labs Reviewed today:    No follow-ups on file.   Annual wellness visit done today including the all of the following: Reviewed patient's Family Medical History Reviewed and updated list of patient's medical providers Assessment of cognitive impairment was done Assessed patient's functional ability Established a written schedule for health screening services Health Risk Assessent Completed and Reviewed  Discussed health benefits of physical activity, and encouraged her to engage in regular exercise appropriate for her age  and condition.    I,Emily Lagle,acting as a Neurosurgeon for Ronal JINNY Hailstone, MD.,have documented all relevant documentation on the behalf of Ronal JINNY Hailstone, MD,as directed by  Ronal JINNY Hailstone, MD while in the presence of Ronal JINNY Hailstone, MD.   ***

## 2023-08-15 NOTE — Progress Notes (Signed)
 Annual Wellness Visit   Patient Care Team: Maleak Brazzel, Ronal PARAS, MD as PCP - General (Internal Medicine) Trixie File, MD as Consulting Physician (Internal Medicine) [facility] [place] (Optometry)  Visit Date: 08/16/23   Chief Complaint  Patient presents with   Annual Exam   Subjective:  Patient: Ashley Gilmore, Female DOB: January 10, 1966, 58 y.o. MRN: [id number] Ashley Gilmore is a 58 y.o. Female who presents today for her Annual Wellness Visit. Patient has Insomnia; History of Migraine Headaches; Class 2 Severe Obesity w/ Serious Comorbidity and Body Mass Index (BMI) 38.0-38.9; Asthma; Allergic Rhinitis; Hyperlipidemia; History of Smoking; GE Reflux; Metabolic Syndrome; and Type 2 Diabetes Mellitus w/ Hyperglycemia.  History of Hypertension treated with Losartan  50 mg daily. Blood Pressure today normo-tensive at 110/70.   History of Hyperlipidemia treated with Rosuvastatin  5 mg 3x/ week. 08/09/2023 Lipid Panel, compared to 07/2022: LDL 101, decreased from 111; Triglycerides 185, elevated from 151; otherwise WNL.    History of Diabetes Mellitus, type II treated with Jardiance  25 mg daily, Glipizide  5 mg daily, and Mounjaro  7.5 mg injected weekly. 08/09/2023 HgbA1c 6.6, elevated from 5.8 in April, decreased from 7.1 in 10/2022, but no change from 07/2022; Glucose 121, decreased from 241 in 07/2022; Microalbumin: WNL at <0.2. Since last year has lost 16 pounds, today weighs 218 lbsBMI 35.73. Followed by Endocrinologist, Dr. Vianne   History of Allergic Rhinitis treated with Zyrtec 10 mg as needed.  History of Asthma managed with Albuterol  inhaler as needed.  History of Vitamin-D Deficiency treated with Vitamin-D 1000 units daily. 2022 Vitamin-D 24, has not been repeated due to expense.   History of Anxiety/Insomnia treated with Xanax  0.25 mg twice daily as needed.  Elevated Potassium 08/09/2023 of 5.7, similarly to 5.6 on 08/03/2022 with repeat on 08/05/2022 4.2 - this is likely hemolyzed and  have decided not to repeat this today.   Labs 08/09/2023 CBC, compared to 07/2022: RBC 5.70, elevated from 5.66; Hemoglobin 16.0, decreased from 16.1; HCT 49.4, elevated from 48.1; otherwise WNL.   C-MET: Potassium 5.7; Glucose 121; otherwise WNL TSH: 1.89   PAP Smear 08/05/2022 normal with repeat recommendation of 2027.  Mammogram 08/30/2022 noting possible masses in right breast warranting further evaluation; subsequent Right Breast US /Diagnostic Uni-Lateral (Right) Mammogram 09/14/2022 found 2 benign sebaceous cysts and 1 benign simple cyst within right breast tissue w/o evidence of malignancy with repeat recommendation of 2025.  Overdue since 2024; last Colonoscopy 10/25/2019 removed a 4 mm semi-pedunculated polyp from the mid transverse colon and a 10 mm sessile polyp from the distal transverse colon (per pathology hyperplastic polyp(s), 1 w/  features of mucosal prolapse, but negative for dysplasia); otherwise normal exam. Overdue for colonoscopy with Dr. Legrand.  Bone Density will be due 2031.   Vaccine Counseling: Due for Covid-19, Shingrix 1st dose, PNA, Tdap, Hepatitis B, and HIV Screening - discussed, she plans on updating neccessary vaccines at local pharmacy; UTD on Influenza. Patient will consider these. Outpatient Medications Prior to Visit  Medication Sig   albuterol  (VENTOLIN  HFA) 108 (90 Base) MCG/ACT inhaler Inhale 2 puffs into the lungs every 6 hours as needed for wheezing or shortness of breath.   b complex vitamins tablet Take 1 tablet by mouth daily.   Blood Glucose Monitoring Suppl (FREESTYLE LITE) DEVI Use to check blood sugar once a day.   cetirizine (ZYRTEC) 10 MG tablet Take 10 mg by mouth daily.     cholecalciferol (VITAMIN D ) 1000 UNITS tablet Take 1,000 Units by mouth  daily.    empagliflozin  (JARDIANCE ) 25 MG TABS tablet TAKE 1 TABLET BY MOUTH DAILY BEFORE BREAKFAST.   glipiZIDE  (GLUCOTROL ) 5 MG tablet Take 1 tablet (5 mg total) by mouth daily before supper.    glucose blood (FREESTYLE LITE) test strip Use to check blood sugar daily   Lancets (FREESTYLE) lancets Use to check blood sugar once a day.   losartan  (COZAAR ) 50 MG tablet Take 1 tablet (50 mg total) by mouth daily.   metFORMIN  (GLUCOPHAGE -XR) 500 MG 24 hr tablet Take 1 tablet (500 mg total) by mouth 2 (two) times daily with a meal.   NON FORMULARY TURMERIC 1500 MG AND BLACK PEPPER 10 MG CAPSULE   nystatin -triamcinolone  (MYCOLOG II) cream Apply to corners of mouth 4 times a day   rosuvastatin  (CRESTOR ) 5 MG tablet Take 1 tablet (5 mg total) by mouth 3 (three) times a week.   tirzepatide  (MOUNJARO ) 7.5 MG/0.5ML Pen Inject 7.5 mg (0.5 ml) into the skin once a week.   No facility-administered medications prior to visit.   Past Medical History:  Diagnosis Date   Allergy    Diabetes mellitus without complication (HCC)    Hyperlipidemia    Insomnia    Obesity    Medical/Surgical History Narrative:  Allergic/Intolerant to:  Allergies  Allergen Reactions   Metformin  And Related Diarrhea   Penicillins Rash   Past Surgical History:  Procedure Laterality Date   BREAST BIOPSY     oral surgeries     TUBAL LIGATION     Family History  Problem Relation Age of Onset   Breast cancer Mother 53   Diabetes Mother    Hypertension Mother    Diabetes Father    Hypertension Father    Hypertension Sister    Colon cancer Maternal Grandmother    Esophageal cancer Neg Hx    Stomach cancer Neg Hx    Rectal cancer Neg Hx    Family History Narrative: No Family History of Esophageal/Stomach/Rectal Cancer Father w/ hx of Hypertension and Diabetes  Maternal Grandmother w/ hx of Colon Cancer Mother w/ hx of Blindness, Breast Cancer (onset age 66), Hypertension, and Diabetes Sister w/ hx of Hypertension Social History   Social History Narrative   Married - husband works as a Forensic scientist. She works full-time at [employer] as a Designer, multimedia in [employer]. Does not get much exercise outside  of work. 2 adult daughters.   Review of Systems  Constitutional:  Negative for chills, fever, malaise/fatigue and weight loss.  HENT:  Negative for hearing loss, sinus pain and sore throat.   Respiratory:  Negative for cough, hemoptysis and shortness of breath.   Cardiovascular:  Negative for chest pain, palpitations, leg swelling and PND.  Gastrointestinal:  Negative for abdominal pain, constipation, diarrhea, heartburn, nausea and vomiting.  Genitourinary:  Negative for dysuria, frequency and urgency.  Musculoskeletal:  Negative for back pain, myalgias and neck pain.  Skin:  Negative for itching and rash.  Neurological:  Negative for dizziness, tingling, seizures and headaches.  Endo/Heme/Allergies:  Negative for polydipsia.  Psychiatric/Behavioral:  Negative for depression. The patient is not nervous/anxious.     Objective:  Vitals: BP 110/70   Pulse 94   Ht 5' 5.5 (1.664 m)   Wt 218 lb (98.9 kg)   SpO2 96%   BMI 35.73 kg/m  Physical Exam Vitals and nursing note reviewed.  Constitutional:      General: She is not in acute distress.    Appearance: Normal appearance.  She is not ill-appearing or toxic-appearing.  HENT:     Head: Normocephalic and atraumatic.     Right Ear: Hearing, tympanic membrane, ear canal and external ear normal.     Left Ear: Hearing, tympanic membrane, ear canal and external ear normal.     Mouth/Throat:     Pharynx: Oropharynx is clear.  Eyes:     Extraocular Movements: Extraocular movements intact.     Pupils: Pupils are equal, round, and reactive to light.  Neck:     Thyroid : No thyroid  mass, thyromegaly or thyroid  tenderness.     Vascular: No carotid bruit.  Cardiovascular:     Rate and Rhythm: Normal rate and regular rhythm. No extrasystoles are present.    Pulses:          Dorsalis pedis pulses are 2+ on the right side and 2+ on the left side.     Heart sounds: Normal heart sounds. No murmur heard.    No friction rub. No gallop.  Pulmonary:      Effort: Pulmonary effort is normal.     Breath sounds: Normal breath sounds. No decreased breath sounds, wheezing, rhonchi or rales.  Chest:     Chest wall: No mass.  Breasts:    Right: Normal.     Left: Normal.  Abdominal:     Palpations: Abdomen is soft. There is no hepatomegaly, splenomegaly or mass.     Tenderness: There is no abdominal tenderness.     Hernia: No hernia is present.  Musculoskeletal:     Cervical back: Normal range of motion.     Right lower leg: No edema.     Left lower leg: No edema.  Lymphadenopathy:     Cervical: No cervical adenopathy.     Upper Body:     Right upper body: No supraclavicular adenopathy.     Left upper body: No supraclavicular adenopathy.  Skin:    General: Skin is warm and dry.  Neurological:     General: No focal deficit present.     Mental Status: She is alert and oriented to person, place, and time. Mental status is at baseline.     Sensory: Sensation is intact.     Motor: Motor function is intact. No weakness.     Deep Tendon Reflexes: Reflexes are normal and symmetric.  Psychiatric:        Attention and Perception: Attention normal.        Mood and Affect: Mood normal.        Speech: Speech normal.        Behavior: Behavior normal.        Thought Content: Thought content normal.        Cognition and Memory: Cognition normal.        Judgment: Judgment normal.   Most Recent Social Determinants of Health (Including Hx of Alcohol, Tobacco, and Drug Use): SDOH Screenings   Depression (PHQ2-9): Medium Risk (08/12/2023)  Tobacco Use: Medium Risk (08/12/2023)   Social History   Tobacco Use   Smoking status: Former    Current packs/day: 0.00    Types: Cigarettes    Quit date: 02/18/2018    Years since quitting: 5.4   Smokeless tobacco: Never  Vaping Use   Vaping status: Never Used  Substance Use Topics   Alcohol use: No   Drug use: No   Most Recent Fall Risk Assessment:    08/05/2022    2:29 PM  Fall Risk   Falls in  the  past year? 0  Number falls in past yr: 0  Injury with Fall? 0  Risk for fall due to : No Fall Risks  Follow up Falls evaluation completed   Most Recent Depression Screenings:    08/12/2023   11:01 AM 08/05/2022    2:30 PM  PHQ 2/9 Scores  PHQ - 2 Score 2 0  PHQ- 9 Score 8    Results:  Studies Obtained And Personally Reviewed By Me:  PAP Smear 08/05/2022 normal.  Mammogram 08/30/2022 noting possible masses in right breast warranting further evaluation; subsequent Right Breast US /Diagnostic Uni-Lateral (Right) Mammogram 09/14/2022 found 2 benign sebaceous cysts and 1 benign simple cyst within right breast tissue w/o evidence of malignancy.  Colonoscopy 10/25/2019 removed a 4 mm semi-pedunculated polyp from the mid transverse colon and a 10 mm sessile polyp from the distal transverse colon (per pathology hyperplastic polyp(s), 1 w/  features of mucosal prolapse, but negative for dysplasia); otherwise normal exam.  Labs:  CBC w/ Differential Lab Results  Component Value Date   WBC 5.8 08/09/2023   RBC 5.70 (H) 08/09/2023   HGB 16.0 (H) 08/09/2023   HCT 49.4 (H) 08/09/2023   PLT 283 08/09/2023   MCV 86.7 08/09/2023   MCH 28.1 08/09/2023   MCHC 32.4 08/09/2023   RDW 13.1 08/09/2023   MPV 9.8 08/09/2023   LYMPHSABS 1,770 08/03/2022   MONOABS CANCELED 02/12/2016   BASOSABS 52 08/09/2023    Comprehensive Metabolic Panel Lab Results  Component Value Date   NA 142 08/09/2023   K 5.7 (H) 08/09/2023   CL 103 08/09/2023   CO2 31 08/09/2023   GLUCOSE 121 (H) 08/09/2023   BUN 13 08/09/2023   CREATININE 0.64 08/09/2023   CALCIUM  10.0 08/09/2023   PROT 6.8 08/09/2023   ALBUMIN 3.9 08/09/2016   AST 21 08/09/2023   ALT 29 08/09/2023   ALKPHOS 110 08/09/2016   BILITOT 0.4 08/09/2023   EGFR 94 08/05/2022   GFRNONAA 82 07/24/2020   Lipid Panel  Lab Results  Component Value Date   CHOL 196 08/09/2023   HDL 64 08/09/2023   LDLCALC 101 (H) 08/09/2023   TRIG 185 (H) 08/09/2023    A1c Lab Results  Component Value Date   HGBA1C 6.6 (H) 08/09/2023    TSH Lab Results  Component Value Date   TSH 1.89 08/09/2023   Assessment & Plan:   Meds ordered this encounter  Medications   ALPRAZolam  (XANAX ) 0.25 MG tablet    Sig: Take 1 tablet (0.25 mg total) by mouth 2 (two) times daily as needed for anxiety.    Dispense:  30 tablet    Refill:  0  Other Labs Reviewed today: CBC, compared to 07/2022: RBC 5.70, elevated from 5.66; Hemoglobin 16.0, decreased from 16.1; HCT 49.4, elevated from 48.1; otherwise WNL.  C-MET: Potassium 5.7; Glucose 121; otherwise WNL TSH: 1.89   Hypertension treated with Losartan  50 mg daily. Blood Pressure today normo-tensive at 110/70.   Hyperlipidemia treated with Rosuvastatin  5 mg 3x/ week. 08/09/2023 Lipid Panel, compared to 07/2022: LDL 101, decreased from 111; Triglycerides 185, elevated from 151; otherwise WNL.    Diabetes Mellitus, type II treated with Jardiance  25 mg daily, Glipizide  5 mg daily, and Mounjaro  7.5 mg injected weekly. 08/09/2023 HgbA1c 6.6, elevated from 5.8 in April, decreased from 7.1 in 10/2022, but no change from 07/2022; Glucose 121, decreased from 241 in 07/2022; Microalbumin: WNL at <0.2. Since last year has lost 16 pounds, today weighs 218 lbsBMI 35.73. Followed  by Endocrinologist, Dr. Vianne, who she last saw 05/16/2023 and is scheduled to see again 11/15/2023.   Allergic Rhinitis treated with Zyrtec 10 mg as needed.  Asthma managed with Albuterol  inhaler as needed.  History of Vitamin-D Deficiency treated with Vitamin-D 1000 units daily. 2022 Vitamin-D 24, has not been repeated due to expense.   Anxiety/Insomnia treated with Xanax  0.25 mg twice daily as needed.  Elevated Potassium 08/09/2023 of 5.7, similarly to 5.6 on 08/03/2022 with repeat on 08/05/2022 4.2 - this is likely hemolyzed and have decided not to repeat this today.   PAP Smear 08/05/2022 normal with repeat recommendation of 2027.  Mammogram 08/30/2022  noting possible masses in right breast warranting further evaluation; subsequent Right Breast US /Diagnostic Uni-Lateral (Right) Mammogram 09/14/2022 found 2 benign sebaceous cysts and 1 benign simple cyst within right breast tissue w/o evidence of malignancy with repeat recommendation of 2025.  Overdue since 2024; last Colonoscopy 10/25/2019 removed a 4 mm semi-pedunculated polyp from the mid transverse colon and a 10 mm sessile polyp from the distal transverse colon (per pathology hyperplastic polyp(s), 1 w/  features of mucosal prolapse, but negative for dysplasia); otherwise normal exam.  Bone Density will be due 2031.   Vaccine Counseling: Due for Covid-19, Shingrix 1st dose, PNA, Tdap, Hepatitis B, and HIV Screening - discussed, she plans on updating neccessary vaccines at local pharmacy; UTD on Influenza.     Annual wellness visit done today including the all of the following: Reviewed patient's Family Medical History Reviewed and updated list of patient's medical providers Assessment of cognitive impairment was done Assessed patient's functional ability Established a written schedule for health screening services Health Risk Assessent Completed and Reviewed  Discussed health benefits of physical activity, and encouraged her to engage in regular exercise appropriate for her age and condition.    I,Emily Lagle,acting as a Neurosurgeon for Ronal JINNY Hailstone, MD.,have documented all relevant documentation on the behalf of Ronal JINNY Hailstone, MD,as directed by  Ronal JINNY Hailstone, MD while in the presence of Ronal JINNY Hailstone, MD.   I, Ronal JINNY Hailstone, MD, have reviewed all documentation for this visit. The documentation on 08/16/23 for the exam, diagnosis, procedures, and orders are all accurate and complete.

## 2023-08-16 ENCOUNTER — Encounter: Payer: Self-pay | Admitting: Internal Medicine

## 2023-08-16 NOTE — Patient Instructions (Addendum)
 Vaccines and Colonosopy discussed.  Tdap may be available through Employee health. Repeat colonoscopy due.Due for pneumococcal vaccine. Continue close follow up with Dr. Irean.

## 2023-09-12 ENCOUNTER — Other Ambulatory Visit (HOSPITAL_COMMUNITY): Payer: Self-pay

## 2023-09-12 ENCOUNTER — Other Ambulatory Visit: Payer: Self-pay | Admitting: Internal Medicine

## 2023-09-12 MED ORDER — METFORMIN HCL ER 500 MG PO TB24
500.0000 mg | ORAL_TABLET | Freq: Two times a day (BID) | ORAL | 1 refills | Status: AC
  Filled 2023-09-12: qty 180, 90d supply, fill #0
  Filled 2023-12-12: qty 180, 90d supply, fill #1

## 2023-10-03 ENCOUNTER — Other Ambulatory Visit (HOSPITAL_COMMUNITY): Payer: Self-pay

## 2023-10-03 ENCOUNTER — Other Ambulatory Visit: Payer: Self-pay | Admitting: Internal Medicine

## 2023-10-03 DIAGNOSIS — Z1231 Encounter for screening mammogram for malignant neoplasm of breast: Secondary | ICD-10-CM

## 2023-10-03 MED ORDER — ALBUTEROL SULFATE HFA 108 (90 BASE) MCG/ACT IN AERS
2.0000 | INHALATION_SPRAY | Freq: Four times a day (QID) | RESPIRATORY_TRACT | 3 refills | Status: AC | PRN
  Filled 2023-10-03: qty 6.7, 25d supply, fill #0

## 2023-10-14 ENCOUNTER — Ambulatory Visit
Admission: RE | Admit: 2023-10-14 | Discharge: 2023-10-14 | Disposition: A | Discharge status: Home / Self Care | Source: Ambulatory Visit | Attending: Internal Medicine | Admitting: Internal Medicine

## 2023-10-14 DIAGNOSIS — Z1231 Encounter for screening mammogram for malignant neoplasm of breast: Secondary | ICD-10-CM | POA: Diagnosis not present

## 2023-10-20 DIAGNOSIS — E119 Type 2 diabetes mellitus without complications: Secondary | ICD-10-CM | POA: Diagnosis not present

## 2023-11-01 ENCOUNTER — Other Ambulatory Visit (HOSPITAL_COMMUNITY): Payer: Self-pay

## 2023-11-15 ENCOUNTER — Ambulatory Visit: Admitting: Internal Medicine

## 2023-11-15 ENCOUNTER — Encounter: Payer: Self-pay | Admitting: Internal Medicine

## 2023-11-15 VITALS — BP 120/70 | HR 94 | Ht 65.5 in | Wt 231.0 lb

## 2023-11-15 DIAGNOSIS — E1165 Type 2 diabetes mellitus with hyperglycemia: Secondary | ICD-10-CM | POA: Diagnosis not present

## 2023-11-15 DIAGNOSIS — Z7984 Long term (current) use of oral hypoglycemic drugs: Secondary | ICD-10-CM | POA: Diagnosis not present

## 2023-11-15 DIAGNOSIS — E66812 Obesity, class 2: Secondary | ICD-10-CM

## 2023-11-15 DIAGNOSIS — Z6838 Body mass index (BMI) 38.0-38.9, adult: Secondary | ICD-10-CM

## 2023-11-15 DIAGNOSIS — Z7985 Long-term (current) use of injectable non-insulin antidiabetic drugs: Secondary | ICD-10-CM

## 2023-11-15 DIAGNOSIS — E7849 Other hyperlipidemia: Secondary | ICD-10-CM | POA: Diagnosis not present

## 2023-11-15 NOTE — Patient Instructions (Addendum)
 Please use the following regimen: - Metformin  ER 500 mg 2x a day (try not to forget doses) - Glipizide  5 mg before a larger/later dinner - Jardiance  25 mg before b'fast - Mounjaro  7.5 mg weekly   Please return in 3-4 months with your sugar log.

## 2023-11-15 NOTE — Progress Notes (Signed)
 Patient ID: Ashley Gilmore, female   DOB: [birth year], 58 y.o.   MRN: [id number] HPI: Ashley Gilmore is a 58 y.o.-year-old female, returning for f/u for DM2, dx in GDM in 1995 and 1997, then DM2 in ~2010, non-insulin -dependent, uncontrolled, without long-term complications.  Her husband is also my patient.  Last visit 6 months ago.  Interim history: No increased urination, blurry vision, chest pain.   She does mention increased stress and forgetfulness.  She is occasionally forgetting to take her evening medicines.  Reviewed HbA1c levels: Lab Results  Component Value Date   HGBA1C 6.6 (H) 08/09/2023   HGBA1C 5.8 (A) 05/16/2023   HGBA1C 7.1 (A) 11/11/2022   HGBA1C 6.6 (H) 08/03/2022   HGBA1C 6.3 (A) 04/20/2022   HGBA1C 6.2 (A) 12/08/2021   HGBA1C 6.3 (H) 07/28/2021   HGBA1C 7.0 (A) 04/02/2021   HGBA1C 7.7 (A) 12/23/2020   HGBA1C 7.2 (H) 07/24/2020   HGBA1C 7.2 (A) 07/14/2020   HGBA1C 6.7 (A) 12/25/2019   HGBA1C 7.8 (A) 06/05/2019   HGBA1C 7.8 (A) 09/26/2018   HGBA1C 7.4 (A) 02/28/2018   HGBA1C 7.4 (H) 10/31/2017   HGBA1C 7.5 (A) 09/27/2017   HGBA1C 7.0 (H) 04/11/2017   HGBA1C 6.3 11/25/2016   HGBA1C 6.4 (H) 08/09/2016  04/30/2014: HbA1c 7.5%.  Pt is on a regimen of: - Metformin  XR 500 mg 2x a day ...>> 500 mg 2x a day >> 1000 mg with dinner >> 500 mg 2x a day - Jardiance  25 mg before b'fast - Mounjaro  5 >> 7.5 mg weekly - Glipizide  instant release 5 mg before dinner >> only before larger meals Stopped Invokana  100 mg daily in am b/c of an yeast inf- started 02/2014  She tried regular Metformin  when prediabetic >> severe diarrhea. Before Mounjaro , [name] , and then Trulicity . She was previously on glipizide  XL.  Pt checks her sugars once a day: - am: 119-142 >> 130s, 170 >> 120-130s >> 150-170 - 2h after b'fast:n/c >> 184  >> n/c  - before lunch:79 >> 101, 117 >> n/c - 2h after lunch:131 >> 163 >> n/c >> 117 >> n/c  - before dinner: 82-132 >> 99-130s >> 98-125 >>  118-130 - 2h after dinner:  88-172 >> 138 >> 98, 104 >> n/c - bedtime: 101-140 >> 116-140 >> 160-170 >> 130-150 - nighttime: 70-130 >> n/c >> 84-115 >> n/c Lowest sugar was  68 >> ... 98 >> 118; she has hypoglycemia awareness in the 70s. Highest sugar was 210 >> .Ashley Gilmore.170 >> 130s >> 170s  Glucometer:  Banker Next  Pt's meals are: - Breakfast: protein bar - mid-am snack: apple + PB - Lunch: sandwich or salad - Dinner: meat + green veggie + starch - Snacks: 2 fruit, [name], [name], last BUN/creatinine:  Lab Results  Component Value Date   BUN 13 08/09/2023   CREATININE 0.64 08/09/2023   Lab Results  Component Value Date   MICRALBCREAT NOTE 08/09/2023   MICRALBCREAT NOTE 08/03/2022   MICRALBCREAT 4 10/31/2017   MICRALBCREAT NOTE 04/11/2017   MICRALBCREAT 3 02/12/2016  On losartan .  -+ HL; last set of lipids: Lab Results  Component Value Date   CHOL 196 08/09/2023   HDL 64 08/09/2023   LDLCALC 101 (H) 08/09/2023   LDLDIRECT 154.0 09/26/2018   TRIG 185 (H) 08/09/2023   CHOLHDL 3.1 08/09/2023  Previously on Zocor -muscle cramps >> now on Crestor  5 mg 3-5x a week.  -  last eye exam was 10/2023: No DR reportedly.  - No numbness and tingling in her feet.  Last foot exam 05/16/2023.  She quit smoking >> on Chantix .  ROS: + see HPI  I reviewed pt's medications, allergies, [name], social hx, family hx, [name], [name], research (life sciences)   Social History Main  Topics   Smoking status: Current Every Day Smoker -- 0.5 packs/day    Types: Cigarettes   Smokeless tobacco: Never Used   Alcohol Use: No   Drug Use: No   Current Outpatient Medications on File Prior to Visit  Medication Sig Dispense Refill   albuterol  (VENTOLIN  HFA) 108 (90 Base) MCG/ACT inhaler Inhale 2 puffs into the lungs every 6 hours as needed for wheezing or shortness of breath. 6.7 g 3   ALPRAZolam  (XANAX ) 0.25 MG tablet Take 1 tablet (0.25 mg total) by mouth 2 (two) times daily as needed for anxiety. 30 tablet 0   b complex vitamins tablet Take 1 tablet by mouth daily.     Blood Glucose Monitoring Suppl (FREESTYLE LITE) DEVI Use to check blood sugar once a day. 1 each 0   cetirizine (ZYRTEC) 10 MG tablet Take 10 mg by mouth daily.       cholecalciferol (VITAMIN D ) 1000 UNITS tablet Take 1,000 Units by mouth daily.      empagliflozin  (JARDIANCE ) 25 MG TABS tablet TAKE 1 TABLET BY MOUTH DAILY BEFORE BREAKFAST. 90 tablet 3   glipiZIDE  (GLUCOTROL ) 5 MG tablet Take 1 tablet (5 mg total) by mouth daily before supper. 90 tablet 3   glucose blood (FREESTYLE LITE) test strip Use to check blood sugar daily 100 strip 12   Lancets (FREESTYLE) lancets Use to check blood sugar once a day. 100 each 12   losartan  (COZAAR ) 50 MG tablet Take 1 tablet (50 mg total) by mouth daily. 90 tablet 3   metFORMIN  (GLUCOPHAGE -XR) 500 MG 24 hr tablet Take 1 tablet (500 mg total) by mouth 2 (two) times daily with a meal. 180 tablet 1   NON FORMULARY TURMERIC 1500 MG AND BLACK PEPPER 10 MG CAPSULE     nystatin -triamcinolone  (MYCOLOG II) cream Apply to corners of mouth 4 times a day 15 g 1   rosuvastatin  (CRESTOR ) 5 MG tablet Take 1 tablet (5 mg total) by mouth 3 (three) times a week. 36 tablet 3   tirzepatide  (MOUNJARO ) 7.5 MG/0.5ML Pen Inject 7.5 mg (0.5 ml) into the skin once a week. 2 mL 11   No current facility-administered medications on file prior to visit.   Allergies  Allergen Reactions   Metformin   And Related Diarrhea   Penicillins Rash   Family History  Problem Relation Age of Onset   Breast cancer Mother 70   Diabetes Mother    Hypertension Mother    Diabetes Father    Hypertension Father    Hypertension Sister    Colon cancer  Maternal Grandmother    Esophageal cancer Neg Hx    Stomach cancer Neg Hx    Rectal cancer Neg Hx    PE: BP 120/70   Pulse 94   Ht 5' 5.5 (1.664 m)   Wt 231 lb (104.8 kg)   SpO2 95%   BMI 37.86 kg/m    Wt Readings from Last 3 Encounters:  11/15/23 231 lb (104.8 kg)  08/12/23 218 lb (98.9 kg)  05/16/23 227 lb (103 kg)   Constitutional: overweight, in NAD Eyes: EOMI, no exophthalmos ENT: no thyromegaly, no cervical lymphadenopathy Cardiovascular: tachycardia, RR, No MRG Respiratory: CTA B Musculoskeletal: no deformities Skin: no rashes Neurological: no tremor with outstretched hands  ASSESSMENT: 1. DM2, non-insulin -dependent, uncontrolled, without long term complications, but with am hyperglycemia  2. Obesity class 2  3. HL  PLAN:  1. Patient with longstanding, controlled, type 2 diabetes, on metformin , sulfonylurea, SGLT2 inhibitor and weekly GLP-1/GIP receptor agonist, with significant improvement in control at last visit, when HbA1c returned 5.8%.  At that time, sugars were mostly at goal, with only occasional slightly higher values, in the 130s in the morning.  She was not checking blood sugars after meals.  She was using glipizide  only occasionally, before a late or large meal.  We continued the same regimen at that time.  She had another HbA1c obtained 3 months ago and this was higher, at 6.6%. - At today's visit, sugars are higher in the morning, and they improve later in the day.  HbA1c, however, appears to be lower than previously (see below).  She does mention that she is forgetting to take her metformin  at night many times.  We discussed about strategies to remember to take it but for now, I did not recommend a change in  regimen. - I suggested to:  Patient Instructions  Please use the following regimen: - Metformin  ER 500 mg 2x a day (try not to forget doses) - Glipizide  5 mg before a larger/later dinner - Jardiance  25 mg before b'fast - Mounjaro  7.5 mg weekly   Please return in 3-4 months with your sugar log.  - we checked her HbA1c: 6.4% (lower) - advised to check sugars at different times of the day - 1x a day, rotating check times - advised for yearly eye exams >> she is UTD - return to clinic in 3-4 months  2. Obesity class 2 - will continue the SGLT2 inhibitor and GLP-1/GIP receptor agonist, which should also help with weight loss - She lost 6 pounds before last visit but gained 4 pounds net since then  3. HL - Latest lipid panel showed an LDL slightly above target, triglycerides elevated, Lab Results  Component Value Date   CHOL 196 08/09/2023   HDL 64 08/09/2023   LDLCALC 101 (H) 08/09/2023   LDLDIRECT 154.0 09/26/2018   TRIG 185 (H) 08/09/2023   CHOLHDL 3.1 08/09/2023  - She takes Crestor  5 mg 3-5 times a week.  She could not tolerate Zocor  due to muscle cramps. She is also on co-Q10 and turmeric.  Lela Fendt, MD PhD [facility]

## 2023-11-18 ENCOUNTER — Other Ambulatory Visit: Payer: Self-pay | Admitting: Internal Medicine

## 2023-11-21 ENCOUNTER — Other Ambulatory Visit (HOSPITAL_COMMUNITY): Payer: Self-pay

## 2023-11-22 ENCOUNTER — Other Ambulatory Visit (HOSPITAL_COMMUNITY): Payer: Self-pay

## 2023-11-22 MED ORDER — GLIPIZIDE 5 MG PO TABS
5.0000 mg | ORAL_TABLET | Freq: Every day | ORAL | 3 refills | Status: AC
  Filled 2023-11-22: qty 90, 90d supply, fill #0
  Filled 2024-02-20: qty 90, 90d supply, fill #1

## 2023-12-09 ENCOUNTER — Other Ambulatory Visit (HOSPITAL_COMMUNITY): Payer: Self-pay

## 2024-01-03 ENCOUNTER — Other Ambulatory Visit (HOSPITAL_COMMUNITY): Payer: Self-pay

## 2024-01-16 ENCOUNTER — Other Ambulatory Visit (HOSPITAL_COMMUNITY): Payer: Self-pay

## 2024-01-23 ENCOUNTER — Other Ambulatory Visit: Payer: Self-pay

## 2024-03-20 ENCOUNTER — Ambulatory Visit: Admitting: Internal Medicine

## 2024-08-14 ENCOUNTER — Other Ambulatory Visit: Payer: Self-pay

## 2024-08-16 ENCOUNTER — Encounter: Payer: Self-pay | Admitting: Internal Medicine
